# Patient Record
Sex: Female | Born: 1970
Health system: Southern US, Community
[De-identification: ages and names within clinical notes are randomized; demographics above are authoritative.]

## PROBLEM LIST (undated history)

## (undated) DIAGNOSIS — F329 Major depressive disorder, single episode, unspecified: Secondary | ICD-10-CM

## (undated) DIAGNOSIS — Z9989 Dependence on other enabling machines and devices: Secondary | ICD-10-CM

## (undated) DIAGNOSIS — F32A Depression, unspecified: Secondary | ICD-10-CM

## (undated) DIAGNOSIS — G4733 Obstructive sleep apnea (adult) (pediatric): Secondary | ICD-10-CM

## (undated) DIAGNOSIS — F419 Anxiety disorder, unspecified: Secondary | ICD-10-CM

## (undated) DIAGNOSIS — L4050 Arthropathic psoriasis, unspecified: Secondary | ICD-10-CM

## (undated) HISTORY — DX: Obstructive sleep apnea (adult) (pediatric): G47.33

## (undated) HISTORY — DX: Anxiety disorder, unspecified: F41.9

## (undated) HISTORY — DX: Arthropathic psoriasis, unspecified: L40.50

## (undated) HISTORY — DX: Major depressive disorder, single episode, unspecified: F32.9

## (undated) HISTORY — DX: Depression, unspecified: F32.A

## (undated) HISTORY — PX: FRACTURE SURGERY: SHX138

## (undated) HISTORY — DX: Dependence on other enabling machines and devices: Z99.89

---

## 2000-02-02 ENCOUNTER — Other Ambulatory Visit: Admission: RE | Admit: 2000-02-02 | Discharge: 2000-02-02 | Payer: Self-pay | Admitting: Gynecology

## 2000-04-12 ENCOUNTER — Encounter: Admission: RE | Admit: 2000-04-12 | Discharge: 2000-07-11 | Payer: Self-pay | Admitting: Gynecology

## 2000-09-02 ENCOUNTER — Observation Stay (HOSPITAL_COMMUNITY): Admission: AD | Admit: 2000-09-02 | Discharge: 2000-09-03 | Payer: Self-pay | Admitting: Gynecology

## 2000-09-25 ENCOUNTER — Inpatient Hospital Stay (HOSPITAL_COMMUNITY): Admission: AD | Admit: 2000-09-25 | Discharge: 2000-09-27 | Payer: Self-pay | Admitting: Gynecology

## 2000-11-06 ENCOUNTER — Other Ambulatory Visit: Admission: RE | Admit: 2000-11-06 | Discharge: 2000-11-06 | Payer: Self-pay | Admitting: Gynecology

## 2001-12-07 ENCOUNTER — Other Ambulatory Visit: Admission: RE | Admit: 2001-12-07 | Discharge: 2001-12-07 | Payer: Self-pay | Admitting: Gynecology

## 2002-12-27 ENCOUNTER — Other Ambulatory Visit: Admission: RE | Admit: 2002-12-27 | Discharge: 2002-12-27 | Payer: Self-pay | Admitting: Gynecology

## 2003-12-29 ENCOUNTER — Other Ambulatory Visit: Admission: RE | Admit: 2003-12-29 | Discharge: 2003-12-29 | Payer: Self-pay | Admitting: Gynecology

## 2004-08-03 ENCOUNTER — Encounter: Admission: RE | Admit: 2004-08-03 | Discharge: 2004-08-03 | Payer: Self-pay | Admitting: Orthopedic Surgery

## 2004-09-01 ENCOUNTER — Encounter: Admission: RE | Admit: 2004-09-01 | Discharge: 2004-09-01 | Payer: Self-pay | Admitting: Family Medicine

## 2004-09-11 ENCOUNTER — Encounter: Admission: RE | Admit: 2004-09-11 | Discharge: 2004-09-11 | Payer: Self-pay | Admitting: Neurology

## 2005-01-07 ENCOUNTER — Other Ambulatory Visit: Admission: RE | Admit: 2005-01-07 | Discharge: 2005-01-07 | Payer: Self-pay | Admitting: Gynecology

## 2005-11-14 ENCOUNTER — Ambulatory Visit (HOSPITAL_COMMUNITY): Admission: RE | Admit: 2005-11-14 | Discharge: 2005-11-14 | Payer: Self-pay | Admitting: Neurology

## 2005-11-18 ENCOUNTER — Ambulatory Visit (HOSPITAL_COMMUNITY): Admission: RE | Admit: 2005-11-18 | Discharge: 2005-11-18 | Payer: Self-pay | Admitting: Neurology

## 2006-01-13 ENCOUNTER — Other Ambulatory Visit: Admission: RE | Admit: 2006-01-13 | Discharge: 2006-01-13 | Payer: Self-pay | Admitting: Gynecology

## 2006-12-07 LAB — CONVERTED CEMR LAB: Pap Smear: NORMAL

## 2007-01-18 ENCOUNTER — Other Ambulatory Visit: Admission: RE | Admit: 2007-01-18 | Discharge: 2007-01-18 | Payer: Self-pay | Admitting: Gynecology

## 2007-02-13 ENCOUNTER — Inpatient Hospital Stay (HOSPITAL_COMMUNITY): Admission: AD | Admit: 2007-02-13 | Discharge: 2007-02-13 | Payer: Self-pay | Admitting: Gynecology

## 2007-09-24 ENCOUNTER — Inpatient Hospital Stay (HOSPITAL_COMMUNITY): Admission: RE | Admit: 2007-09-24 | Discharge: 2007-09-26 | Payer: Self-pay | Admitting: Obstetrics and Gynecology

## 2010-12-21 ENCOUNTER — Other Ambulatory Visit: Payer: Self-pay | Admitting: Family Medicine

## 2010-12-21 ENCOUNTER — Ambulatory Visit
Admission: RE | Admit: 2010-12-21 | Discharge: 2010-12-21 | Payer: Self-pay | Source: Home / Self Care | Attending: Family Medicine | Admitting: Family Medicine

## 2010-12-21 ENCOUNTER — Other Ambulatory Visit
Admission: RE | Admit: 2010-12-21 | Discharge: 2010-12-21 | Payer: Self-pay | Source: Home / Self Care | Admitting: Family Medicine

## 2010-12-21 DIAGNOSIS — N898 Other specified noninflammatory disorders of vagina: Secondary | ICD-10-CM | POA: Insufficient documentation

## 2010-12-21 DIAGNOSIS — F4323 Adjustment disorder with mixed anxiety and depressed mood: Secondary | ICD-10-CM | POA: Insufficient documentation

## 2010-12-21 DIAGNOSIS — F329 Major depressive disorder, single episode, unspecified: Secondary | ICD-10-CM | POA: Insufficient documentation

## 2010-12-21 DIAGNOSIS — F411 Generalized anxiety disorder: Secondary | ICD-10-CM | POA: Insufficient documentation

## 2010-12-21 LAB — HM MAMMOGRAPHY

## 2010-12-21 LAB — HM PAP SMEAR

## 2010-12-24 ENCOUNTER — Telehealth (INDEPENDENT_AMBULATORY_CARE_PROVIDER_SITE_OTHER): Payer: Self-pay | Admitting: *Deleted

## 2010-12-27 ENCOUNTER — Encounter: Payer: Self-pay | Admitting: Family Medicine

## 2010-12-29 ENCOUNTER — Encounter: Payer: Self-pay | Admitting: Family Medicine

## 2010-12-30 ENCOUNTER — Encounter: Payer: Self-pay | Admitting: Family Medicine

## 2011-01-03 ENCOUNTER — Encounter: Payer: Self-pay | Admitting: Family Medicine

## 2011-01-06 NOTE — Letter (Signed)
Summary: Results Follow up Letter  [place] at [facility]  [address] [place], [place] [address]   Phone: [phone]  Fax: [phone]    12/27/2010 MRN: [id number]  [facility] [address] [place], [place]  [address]  Dear Ms. Propes,  The following are the results of your recent test(s):  Test         Result    Pap Smear:        Normal __X___  Not Normal _____ Comments: ______________________________________________________ Cholesterol: LDL(Bad cholesterol):         Your goal is less than:         HDL (Good cholesterol):       Your goal is more than: Comments:  ______________________________________________________ Mammogram:        Normal _____  Not Normal _____ Comments:  ___________________________________________________________________ Hemoccult:        Normal _____  Not normal _______ Comments:    _____________________________________________________________________ Other Tests:    We routinely do not discuss normal results over the telephone.  If you desire a copy of the results, or you have any questions about this information we can discuss them at your next office visit.   Sincerely,      Dr. Ruthe Mannan

## 2011-01-06 NOTE — Assessment & Plan Note (Signed)
Summary: [name]   Vital Signs:  Patient profile:   40 year old female Height:      68 inches Weight:      227.25 pounds BMI:     34.68 Temp:     98.6 degrees F oral Pulse rate:   67 / minute Pulse rhythm:   regular BP sitting:   102 / 80  (right arm) Cuff size:   regular  Vitals Entered By: Linde Gillis CMA Duncan Dull) (December 21, 2010 10:59 AM) CC: new patient, establish care   History of Present Illness: 40 yo here to establish care and for CPX.  Adjustment disorder with mixed features- followed by Crystal Houston every two weeks.  Symptoms have worsened over past year.  Daughter battling osteosarcoma.  Last month, spot found on her lung.  LIving in constant anxiety. Lexapro 20 mg daily (just increased from 10 mg a few weeks ago) has been helping. No SI or HI. Has good supportive system at home.  Well woman- G3P3, no h/o abnormal pap smears. IUD placed 3 years ago. Having some thick, itchy discharge.   No dysuria or other vaginal complaints.    Preventive Screening-Counseling & Management      Drug Use:  no.    Current Medications (verified): 1)  Lexapro 20 Mg Tabs (Escitalopram Oxalate) .... Take One Tablet By Mouth At Bedtime 2)  Klonopin 0.5 Mg Tabs (Clonazepam) .... Take As Needed 3)  Multivitamins  Tabs (Multiple Vitamin) .... Take One Tablet By Mouth Daily 4)  Diflucan 150 Mg Tabs (Fluconazole) .... 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 Eyes:  Denies  blurring. ENT:  Denies difficulty swallowing. CV:  Denies chest pain or discomfort. Resp:  Denies shortness of breath. GI:  Denies abdominal pain. GU:  Complains of discharge; denies abnormal vaginal bleeding, dysuria, and nocturia. MS:  Denies joint pain, joint redness, and joint swelling. Derm:  Denies rash. Neuro:  Denies headaches. Psych:  Complains of anxiety, depression, easily tearful, and panic attacks; denies easily angered, irritability, mental problems, suicidal thoughts/plans, thoughts of violence, unusual visions or sounds, and thoughts /plans of harming others. Endo:  Denies cold intolerance and heat intolerance. Heme:  Denies abnormal bruising and bleeding.  Physical Exam  General:  alert and well-developed.   Head:  normocephalic and atraumatic.   Eyes:  vision grossly intact, pupils equal, and pupils round.   Ears:  R ear normal and L ear normal.   Nose:  no external deformity.   Mouth:  good dentition.   Neck:  No deformities, masses, or tenderness noted. Lungs:  Normal respiratory effort, chest expands symmetrically. Lungs are clear to auscultation, no crackles or wheezes. Heart:  Normal rate and regular rhythm. S1 and S2 normal without gallop, murmur, click, rub or other extra sounds. Abdomen:  Bowel sounds positive,abdomen soft and non-tender without masses, organomegaly or hernias noted. Genitalia:  Pelvic Exam:        External: normal female genitalia without lesions or masses        Vagina:  normal without lesions or masses        Cervix: normal without lesions or masses, IUD string visible- some thick, cheesy discharge in os        Adnexa: normal bimanual exam without masses or fullness        Uterus: normal by palpation        Pap smear: performed Extremities:  no edema old scar on left lower leg Neurologic:  alert & oriented X3 and gait normal.   Skin:  Intact without suspicious lesions or rashes Psych:  normally interactive and good eye contact.    tearful when talking about her daughter.    Impression & Recommendations:  Problem # 1:  HEALTH SCREENING (ICD-V70.0) Reviewed preventive care protocols, scheduled due services, and updated immunizations Discussed nutrition, exercise, diet, and healthy lifestyle.  BMET, FLP today. Pap performed today. Set up mammogram today. Orders: Venipuncture (40981) TLB-BMP (Basic Metabolic Panel-BMET) (80048-METABOL)  Problem # 2:  VAGINAL DISCHARGE (ICD-623.5) Assessment: New Consistent with yeast, diflucan 150 mg by mouth x 1.  Problem # 3:  ADJ DISORDER WITH MIXED ANXIETY & DEPRESSED MOOD (ICD-309.28) Assessment: Deteriorated Doing better now on Lexapro 20 mg daily, refilled her medication. Continue psychotherapy with Dr. Ardelle Anton.  Complete Medication List: 1)  Lexapro 20 Mg Tabs (Escitalopram oxalate) .... Take one tablet by mouth at bedtime 2)  Klonopin 0.5 Mg Tabs (Clonazepam) .... Take as needed 3)  Multivitamins Tabs (Multiple vitamin) .... Take one tablet by mouth daily 4)  Diflucan 150 Mg Tabs (Fluconazole) .... Once daily  Other Orders: Radiology Referral (Radiology) TLB-Lipid Panel (80061-LIPID)  Patient Instructions: 1)  Great to meet you. 2)  Please stop by to see Shirlee Limerick on your way out. Prescriptions: LEXAPRO 20 MG TABS (ESCITALOPRAM OXALATE) take one tablet by mouth at bedtime  #30 x 3   Entered and Authorized by:   Ruthe Mannan MD   Signed by:   Ruthe Mannan MD on 12/21/2010   Method used:   Electronically to        [id number] (retail)       [address]       [place], [place]  [address]       Ph: [phone]       Fax: [phone]   RxID:   [id number] DIFLUCAN 150 MG TABS (FLUCONAZOLE) once daily  #1 x 0   Entered and Authorized by:   Ruthe Mannan MD   Signed by:   Ruthe Mannan MD on 12/21/2010   Method used:   Electronically to        [facility] [address] [id number] ([facility]       [address]       [place], [place]  [address]       Ph: [phone]       Fax: [phone]   RxID:   [id number]    Orders Added: 1)  Radiology Referral [Radiology] 2)  Venipuncture [32202] 3)  TLB-Lipid Panel [80061-LIPID] 4)  TLB-BMP (Basic Metabolic Panel-BMET) [80048-METABOL] 5)  New Patient 40-64 years [99386] 6)  New Patient Level III [99203]    Current Allergies (reviewed today): No known allergies    Prevention & Chronic Care Immunizations   Influenza vaccine: Not documented    Tetanus booster: 01/03/2007: historical   Tetanus booster due: 01/03/2017    Pneumococcal vaccine: Not documented  Other Screening  Pap smear: historical- normal  (12/07/2006)   Pap smear action/deferral: Ordered  (12/21/2010)   Pap smear due: 12/08/2007    Mammogram: Not documented   Mammogram action/deferral: Ordered  (12/21/2010)   Smoking status: Not documented  Lipids   Total Cholesterol: Not documented   LDL: Not documented   LDL Direct: Not documented   HDL: Not documented   Triglycerides: Not documented   Nursing Instructions: Pap smear today Schedule screening mammogram (see order)   TD Result Date:  01/03/2007 TD Result:  historical PAP Result Date:  12/07/2006 PAP Result:  historical- normal   Appended Document: Orders Update    Clinical Lists Changes  Orders: Added new Service order of Specimen Handling (40981) - Signed Added new Test order of TLB-Lipid Panel (80061-LIPID) - Signed Added new Test order of TLB-BMP (Basic Metabolic Panel-BMET) (80048-METABOL) - Signed

## 2011-01-06 NOTE — Progress Notes (Signed)
----   Converted from flag ---- ---- 12/24/2010 10:37 AM, Mills Koller wrote: It was discovered today that the Labs from  two [facility] patient's were never done. Ofcourse the [facility] rep is looking into this...Marland Kitchen. but, this patient has been called and is coming in today for repeat draw. Terri ------------------------------

## 2011-01-12 NOTE — Letter (Signed)
Summary: Results Follow up Letter  [place] at [facility]  [address] [place], [place] [address]   Phone: [phone]  Fax: [phone]    01/03/2011 MRN: [id number]  [facility] [address] [place], [place]  [address]  Dear Ms. Trombly,  The following are the results of your recent test(s):  Test         Result    Pap Smear:        Normal _____  Not Normal _____ Comments: ______________________________________________________ Cholesterol: LDL(Bad cholesterol):         Your goal is less than:         HDL (Good cholesterol):       Your goal is more than: Comments:  ______________________________________________________ Mammogram:        Normal __X___  Not Normal _____ Comments: Repeat in one year.  ___________________________________________________________________ Hemoccult:        Normal _____  Not normal _______ Comments:    _____________________________________________________________________ Other Tests:    We routinely do not discuss normal results over the telephone.  If you desire a copy of the results, or you have any questions about this information we can discuss them at your next office visit.   Sincerely,      Dr. Ruthe Mannan

## 2011-01-17 ENCOUNTER — Ambulatory Visit (INDEPENDENT_AMBULATORY_CARE_PROVIDER_SITE_OTHER): Payer: 59 | Admitting: Family Medicine

## 2011-01-17 ENCOUNTER — Encounter: Payer: Self-pay | Admitting: Family Medicine

## 2011-01-17 DIAGNOSIS — J018 Other acute sinusitis: Secondary | ICD-10-CM | POA: Insufficient documentation

## 2011-01-26 NOTE — Assessment & Plan Note (Signed)
Summary: sinus infection/alc   Vital Signs:  Patient profile:   40 year old female Height:      68 inches Weight:      223.25 pounds BMI:     34.07 Temp:     97.5 degrees F oral Pulse rate:   82 / minute Pulse rhythm:   regular BP sitting:   122 / 84  (left arm) Cuff size:   regular  Vitals Entered By: Linde Gillis CMA Duncan Dull) (January 17, 2011 12:00 PM) CC: sinus infection   History of Present Illness: 40 yo here for ? sinus infection  4 days of worsening URI symptoms. started with runny nose.   now has frontal sinus pressure, chills. no fever that she is aware of. cough is productive. no wheezing or SOB. ears popping- taking OTC decongestant which is helping with ear pressure.  coughing spells keeping her up at night.  Current Medications (verified): 1)  Lexapro 20 Mg Tabs (Escitalopram Oxalate) .... Take One Tablet By Mouth At Bedtime 2)  Klonopin 0.5 Mg Tabs (Clonazepam) .... Take As Needed 3)  Multivitamins  Tabs (Multiple Vitamin) .... Take One Tablet By Mouth Daily 4)  Cheratussin Ac 100-10 Mg/60ml Syrp (Guaifenesin-Codeine) .... 5 Ml By Mouth Two Times A Day As Needed Cough 5)  Amoxicillin 875 Mg Tabs (Amoxicillin) .Marland Kitchen.. 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 ENT:  Complains of earache, nasal congestion, postnasal drainage, sinus pressure, and sore throat. Resp:  Complains of cough and sputum productive; denies shortness of breath and wheezing.  Physical Exam  General:  alert and well-developed.  congested. VSS Ears:  TMs retracted Nose:  boggy turbinates,  sinuses neg to palp Mouth:  pharyngeal erythema.   Lungs:  Normal respiratory effort, chest expands symmetrically. Lungs are clear to auscultation, no crackles or wheezes. Heart:  Normal rate and regular rhythm. S1 and S2 normal without gallop, murmur, click, rub or other extra sounds. Extremities:  no edema old scar on left lower leg Neurologic:  alert & oriented X3 and gait normal.   Psych:  normally interactive and good eye contact.      Impression & Recommendations:  Problem # 1:  OTHER ACUTE SINUSITIS (ICD-461.8) Assessment New likely viral at this point. cheratussin as needed cough. see pt instructions for supportive care. amoxicillin rx given to fill of no improvement over next several days. Her updated medication list for this problem includes:    Cheratussin Ac 100-10 Mg/26ml Syrp (Guaifenesin-codeine) .Marland KitchenMarland KitchenMarland KitchenMarland Kitchen 5 ml by mouth two times a day as needed cough    Amoxicillin 875 Mg Tabs (Amoxicillin) .Marland Kitchen... 1 tab by mouth two times a day x 10 days  Complete Medication List: 1)  Lexapro 20 Mg Tabs (Escitalopram oxalate) .... Take one tablet by mouth at bedtime 2)  Klonopin 0.5 Mg Tabs (Clonazepam) .... Take as needed 3)  Multivitamins Tabs (Multiple vitamin) .... Take one tablet by mouth daily 4)  Cheratussin Ac 100-10 Mg/50ml Syrp (Guaifenesin-codeine) .... 5 ml by mouth two times a day as needed cough 5)  Amoxicillin 875 Mg Tabs (Amoxicillin) .Marland Kitchen.. 1 tab by mouth two  times a day x 10 days  Patient Instructions: 1)  Take amoxicillin as directed if symptoms do not improve over next several days.  Drink lots of fluids.  Treat sympotmatically with Mucinex, nasal saline irrigation, and Tylenol/Ibuprofen.  Cough suppressant at night. Call if not improving as expected in 5-7 days after starting antibiotic.  Prescriptions: LEXAPRO 20 MG TABS (ESCITALOPRAM OXALATE) take one tablet by mouth at bedtime  #90 x 3   Entered and Authorized by:   Ruthe Mannan MD   Signed by:   Ruthe Mannan MD on  01/17/2011   Method used:   Print then Give to Patient   RxID:   [id number] AMOXICILLIN 875 MG TABS (AMOXICILLIN) 1 tab by mouth two times a day x 10 days  #20 x 0   Entered and Authorized by:   Ruthe Mannan MD   Signed by:   Ruthe Mannan MD on 01/17/2011   Method used:   Print then Give to Patient   RxID:   [id number] CHERATUSSIN AC 100-10 MG/5ML SYRP (GUAIFENESIN-CODEINE) 5 ml by mouth two times a day as needed cough  #5 ounces x 0   Entered and Authorized by:   Ruthe Mannan MD   Signed by:   Ruthe Mannan MD on 01/17/2011   Method used:   Print then Give to Patient   RxID:   [id number]    Orders Added: 1)  Est. Patient Level III [01027]    Current Allergies (reviewed today): No known allergies

## 2011-03-02 ENCOUNTER — Other Ambulatory Visit: Payer: Self-pay | Admitting: *Deleted

## 2011-03-02 MED ORDER — ESCITALOPRAM OXALATE 20 MG PO TABS: 20.0000 mg | ORAL_TABLET | Freq: Every day | ORAL | Status: DC

## 2011-03-02 NOTE — Telephone Encounter (Signed)
Rx sent to [facility] per patients request.

## 2011-04-22 NOTE — Discharge Summary (Signed)
[facility]  Patient:    Crystal [place], Crystal Houston                    MRN: [id number] Adm. Date:  [id number] Disc. Date: [id number] Attending:  Tonye Royalty Dictator:   Antony Contras, [facility]                           Discharge Summary  DISCHARGE DIAGNOSES:          Intrauterine pregnancy, 37-1/2 weeks, with                               spontaneous rupture of membranes.  PROCEDURES:                   Normal spontaneous vaginal delivery over midline                               episiotomy with delivery of viable infant.  HISTORY OF PRESENT ILLNESS:   The patient is a 40 year old, gravida 2, para 1-0-0-1, with an LMP of January 06, 2000, [facility] October 12, 2000.  Prenatal risk factors include a history of previous premature cervical effacement at 32-32 weeks with her last pregnancy, history of placenta previa at 13 weeks which did resolve on follow-up ultrasound.  PRENATAL LABORATORY DATA:     Blood type B positive, antibody screen negative, rubella equivocal.  RPR, HBsAg, HIV nonreactive.  MS-AFP normal.  GBS negative.  HOSPITAL COURSE:              The patient was admitted on September 25, 2000, with spontaneous rupture of membrane and uterine contraction.  Cervix was 3-4 cm dilated, 80% effaced, and -1 station.  There was some question as to the time of rupture of membranes, so penicillin G per protocol for GBS prophylaxis was initiated as well as Pitocin augmentation.  The patient progressed to complete dilatation delivering Apgar 9/9 female infant weighing 7 pounds 1 ounce over midline episiotomy.  Postpartum course:  She remained afebrile, no difficulty voiding.  Was able to be discharged on her second postpartum day in satisfactory condition.  She did receive rubella vaccine prior to discharge.  Postpartum CBC:  Hematocrit 29.8, hemoglobin 10.6, WBC 8.9, platelets 218.  DISCHARGE FOLLOWUP:            Follow up in six weeks.  DISCHARGE  MEDICATIONS:        Continue prenatal vitamins and iron.  Motrin and Tylox for pain. DD:  10/20/00 TD:  10/20/00 Job: [id number]

## 2011-04-22 NOTE — Procedures (Signed)
EEG NUMBER:  Is [id number]   This is an EEG report on this 40 year old woman ordered by Rene Kocher,  M.D., being evaluated for possible seizure with episodes of out-of-body  experience while driving. This is a routine 17 channel EEG with 1 channel  devoted to EKG utilizing [employer] 10/20 lead placement system. The  patient was described clinically as being awake. Electrographically she  appeared to be awake throughout the vast majority of the study although two  brief periods of drowsiness were seen. During these periods of drowsiness  the expected attenuation of background with decreased frequency in amplitude  was seen. While awake the background consisted of a well-organized, well  developed, well modulated 10 Hz alpha activity just predominant to a  posterior head regions and reactive to eye opening. No interhemispheric  asymmetry is identified. No definite epileptiform discharges were seen.  Photic stimulation produced some photic driving near the patient's native  flash frequencies. Hyperventilation did not produce any significant change  in the background activity. The EKG monitor reveals relatively regular  rhythm with a rate of 66 beats per minute.   CONCLUSION:  Normal EEG for the patient's conscious state without evidence  of seizure activity or focal abnormality seen during the course today's  recording. Clinical correlation is recommended.      Catherine A. Orlin Hilding, M.D.  Electronically Signed     [id number]  D:  11/14/2005 10:19:50  T:  11/14/2005 12:05:17  Job #:  [id number]   cc:   Rene Kocher, M.D.  Fax: [phone]

## 2011-04-22 NOTE — H&P (Signed)
[facility]  [place]:    Crystal [place], Crystal Houston                    MRN: [id number] Adm. Date:  [id number] Attending:  Tonye Royalty                         History and Physical  CHIEF COMPLAINT:              Contractions and watery-like discharge.  HISTORY:                      Crystal Houston is a 40 year old gravida 2, para 1 with a last menstrual period of January 06, 2000, and an estimated date of confinement of October 12, 2000, currently 37-1/2 weeks estimated gestational age.  Crystal Houston stated that, earlier today, she had a mucus-like, then a watery-like discharge and her contractions picked up later this evening.  In Crystal emergency room, she was evaluated and found to have a cervix to be 3-4 cm dilated, 80% effaced, -3 station, irregular contractions and no reassuring fetal heart rate tracing.  Clean amniotic fluid was grossly evident in Crystal vaginal vault.  PRENATAL COURSE:              She had early ultrasound which demonstrated placenta previa with subsequent ultrasound demonstrating complete resolution of Crystal placenta previa.  She otherwise had had unremarkable prenatal course. She had careful surveillance due to Crystal fact that, with her previous pregnancy, she had premature cervical effacement.  Nevertheless, she went on to deliver at 40 weeks in 1997.  PAST MEDICAL HISTORY:         Crystal Houston denies any allergies.  In 1997, normal spontaneous vaginal delivery of a 6 pound 14 ounce female.  REVIEW OF SYSTEMS:            See ______ form.  PHYSICAL EXAMINATION;  VITAL SIGNS:                  Temperature 97.4, pulse 84, respirations 2-0, blood pressure 136/86.  HEENT:                        Unremarkable.  NECK:                         Supple.  Trachea midline without bruits or thyromegaly.  LUNGS:                        Clear to auscultation without rhonchi or wheezes.  HEART:                        Regular rate and rhythm.  No  murmurs or gallops.  BREASTS:                      Not performed.  ABDOMEN:                      Gravid uterus.  Vertex presentation on Leopold maneuver.  Positive fetal heart sounds in Crystal 140 beats per minute range.  PELVIC:                       Cervix 3-4 cm dilated, 80% effaced, -3 station, gross pooling of clear amniotic fluid evident.  EXTREMITIES:                  DTRs 1 to 2+ with negative clonus.  PRENATAL LABORATORY DATA:     B positive blood type, negative antibody screen. VDRL nonreactive.  Rubella immune.  Hepatitis B surface antigen and HIV were negative.  Pap smear was normal.  Alpha fetoprotein was normal.  GBS cultures was negative.  Toxoplasmosis screen was also negative.  ASSESSMENT:                   A 40 year old gravida 2, para 1 at 37-1/2 weeks estimated gestational age with questionable rupture of membranes earlier in Crystal day evident at Crystal time of evaluation in Crystal emergency room at [facility], having irregular contractions.  Due to Crystal questionable length of time Crystal Houston may have been ruptured, will go ahead and initiate Penicillin G per protocol for GBS prophylaxis and will commence Pitocin augmentation. While Crystal Houston was monitored in Crystal emergency room, she had a reassuring fetal heart rate tracing.  PLAN:                         As per assessment above. DD:  09/25/00 TD:  09/25/00 Job: [id number]

## 2011-04-22 NOTE — Procedures (Signed)
EEG NUMBER:  [id number]   HISTORY:  This is a 40 year old having an  EEG to evaluate for seizures.   PROCEDURE:  This is a sleep-deprived EEG.   TECHNICAL DESCRIPTION:  Throughout this sleep-deprived EEG, the patient  begins extremely drowsy and quickly falls into stage II sleep with the  appearance of symmetric vertex waves and sleep spindles.  The background  activity is symmetric and mostly compromised of mixed frequency activity  between 30 and 40 microvolts.  Towards the end of the tracing, the patient  wakes and seems to have an appropriate posterior dominant rhythm up around 9  Hz at 20-30 microvolts.  With photic stimulation, the patient has a  symmetric photic driving response.  Hyperventilation produces a mild  symmetric slowing response.  Throughout this recording, there is no evidence  of electrographic seizures or interictal discharge activity.   IMPRESSION:  This routine EEG is within normal limits in the awake and sleep  states.      Bevelyn Buckles. Nash Shearer, M.D.  Electronically Signed     [id number]  D:  11/18/2005 17:44:52  T:  11/19/2005 09:58:34  Job #:  [id number]

## 2011-04-22 NOTE — Discharge Summary (Signed)
Crystal Houston, HEMING                ACCOUNT NO.:  [id number]   MEDICAL RECORD NO.:  [id number]          PATIENT TYPE:  INP   LOCATION:  9105                          FACILITY:  [facility]   PHYSICIAN:  Huel Cote, M.D. DATE OF BIRTH:  1971/09/13   DATE OF ADMISSION:  09/24/2007  DATE OF DISCHARGE:  09/26/2007                               DISCHARGE SUMMARY   DISCHARGE DIAGNOSES:  1. Term pregnancy at 39 plus weeks delivered.  2. Status post normal spontaneous vaginal delivery.   DISCHARGE MEDICATIONS:  1. Motrin 600 mg p.o. every 6 hours.  2. Percocet 1-2 tablets p.o. every 4 hours p.r.n.   DISCHARGE FOLLOWUP:  The patient is to follow up in the office in 6  weeks for routine postpartum exam.   HOSPITAL COURSE:  The patient is a 40 year old G3, P 2-0-0-2 was  admitted at 71 plus weeks gestation for induction of labor given  advanced cervical dilation to 4-5 cm.  She had no significant prenatal  problems.  Prenatal labs are as follows:  B+, antibody negative, RPR  nonreactive, rubella equivocal, hepatitis B surface antigen negative,  HIV negative, GC negative, chlamydia negative, group B strep negative, 1-  hour Glucola 98.   PAST OBSTETRICAL HISTORY:  She had a vaginal delivery 1997 and in 2001  she had another vaginal delivery.  Past GYN history none.   PAST SURGICAL HISTORY:  Just cosmetic surgeries on her left leg  secondary to a lawnmower accident as a child.   PAST MEDICAL HISTORY:  Fibromyalgia   ALLERGIES:  No allergies.   On admission she was afebrile with stable vital signs.  Fetal heart rate  was reactive.  Cervix was 80 and five and -1 station.  She had rupture  of membranes performed was placed on Pitocin and she did deliver within  3 hours, reached complete dilation and pushed well with a normal  spontaneous vaginal delivery of a vigorous female infant over a second-  degree laceration.  Apgars were 8 and 9, weight was 8 pounds 5 ounces.  Secondary laceration was  repaired with 2-0 Vicryl.  She did quite well  postpartum.  On postpartum day #2 was discharged home feeling well.  Her  postpartum hemoglobin was 11.9 and she was felt stable for discharge.      Huel Cote, M.D.  Electronically Signed    KR/MEDQ  D:  10/16/2007  T:  10/17/2007  Job:  [id number]

## 2011-04-30 ENCOUNTER — Encounter: Payer: Self-pay | Admitting: Family Medicine

## 2011-05-03 ENCOUNTER — Ambulatory Visit (INDEPENDENT_AMBULATORY_CARE_PROVIDER_SITE_OTHER): Payer: 59 | Admitting: Family Medicine

## 2011-05-03 ENCOUNTER — Encounter: Payer: Self-pay | Admitting: Family Medicine

## 2011-05-03 VITALS — BP 100/60 | HR 74 | Temp 98.4°F | Ht 68.0 in | Wt 230.8 lb

## 2011-05-03 DIAGNOSIS — E669 Obesity, unspecified: Secondary | ICD-10-CM | POA: Insufficient documentation

## 2011-05-03 DIAGNOSIS — F4323 Adjustment disorder with mixed anxiety and depressed mood: Secondary | ICD-10-CM

## 2011-05-03 MED ORDER — PHENTERMINE HCL 15 MG PO CAPS: ORAL_CAPSULE | ORAL | Status: DC

## 2011-05-03 NOTE — Patient Instructions (Signed)
Please follow up with me in 1 month. Keep a food journal and limit your calories to 1200-1500 calories per day.

## 2011-05-03 NOTE — Assessment & Plan Note (Signed)
Deteriorated. Will check TSH. >25 min spent with face to face with patient counseling and coordinating care on weight loss. Will try Phenteramine but I discussed risk of HTN, pulmonary HTN. The patient indicates understanding of these issues and agrees with the plan. Follow up in one month.

## 2011-05-03 NOTE — Progress Notes (Signed)
Addended by: Baldomero Lamy on: 05/03/2011 08:25 AM   Modules accepted: Orders

## 2011-05-03 NOTE — Assessment & Plan Note (Signed)
Unchanged. Actually doing quite well on Lexapro given increased stressors. Feels it is working well and does not want to stop it.  Will continue for now.

## 2011-05-03 NOTE — Progress Notes (Signed)
40 yo here to discuss medications.  Adjustment disorder with mixed features- followed by Erlene Senters every two weeks.  Symptoms have worsened over past year, husband filed for divorce.  Daughter battling osteosarcoma. Lexapro 20 mg dailyhas been helping. No SI or HI. Has good supportive system at home but feels like she just cannot stop gaining weight.  Obesity- Wt Readings from Last 3 Encounters:  05/03/11 230 lb 12.8 oz (104.69 kg)  01/17/11 223 lb 4 oz (101.266 kg)  12/21/10 227 lb 4 oz (103.08 kg)   Has been going to the gym twice a week and feels like she keeps gaining weight. Feels pressure to loose weight now more than ever because she does not want to be alone for the rest of her life. Has had some constipation and dry hair. No palpitations, CP or SOB. No other signs of hypo or hyperthyroidism.  The PMH, PSH, Social History, Family History, Medications, and allergies have been reviewed in [facility], and have been updated if relevant.   Review of Systems       See HPI General:  Denies malaise. Eyes:  Denies blurring. ENT:  Denies difficulty swallowing. CV:  Denies chest pain or discomfort. Resp:  Denies shortness of breath. MS:  Denies joint pain, joint redness, and joint swelling. Derm:  Denies rash. Neuro:  Denies headaches. Psych:  Complains of anxiety, depression, easily tearful, and panic attacks; denies easily angered, irritability, mental problems, suicidal thoughts/plans, thoughts of violence, unusual visions or sounds, and thoughts /plans of harming others. Endo:  Denies cold intolerance and heat intolerance. Heme:  Denies abnormal bruising and bleeding.  Physical Exam BP 100/60  Pulse 74  Temp(Src) 98.4 F (36.9 C) (Oral)  Ht 5\' 8"  (1.727 m)  Wt 230 lb 12.8 oz (104.69 kg)  BMI 35.09 kg/m2  SpO2 97% General:  alert and well-developed.   Head:  normocephalic and atraumatic.   Eyes:  vision grossly intact, pupils equal, and pupils round.   Ears:  R ear  normal and L ear normal.   Nose:  no external deformity.   Mouth:  good dentition.   Neck:  No deformities, masses, or tenderness noted. Lungs:  Normal respiratory effort, chest expands symmetrically. Lungs are clear to auscultation, no crackles or wheezes. Heart:  Normal rate and regular rhythm. S1 and S2 normal without gallop, murmur, click, rub or other extra sounds. Neurologic:  alert & oriented X3 and gait normal.   Skin:  Intact without suspicious lesions or rashes Psych:  normally interactive and good eye contact.   tearful when talking about her daughter.

## 2011-06-07 ENCOUNTER — Ambulatory Visit (INDEPENDENT_AMBULATORY_CARE_PROVIDER_SITE_OTHER): Payer: 59 | Admitting: Family Medicine

## 2011-06-07 ENCOUNTER — Ambulatory Visit: Payer: 59 | Admitting: Family Medicine

## 2011-06-07 ENCOUNTER — Encounter: Payer: Self-pay | Admitting: Family Medicine

## 2011-06-07 VITALS — BP 108/72 | HR 81 | Temp 98.0°F | Ht 68.0 in | Wt 230.8 lb

## 2011-06-07 DIAGNOSIS — E669 Obesity, unspecified: Secondary | ICD-10-CM

## 2011-06-07 MED ORDER — PHENTERMINE HCL 15 MG PO CAPS: ORAL_CAPSULE | ORAL | Status: DC

## 2011-06-07 NOTE — Progress Notes (Signed)
40 yo here for one month follow up of obesity.     Obesity- Wt Readings from Last 3 Encounters:  06/07/11 230 lb 12 oz (104.668 kg)  05/03/11 230 lb 12.8 oz (104.69 kg)  01/17/11 223 lb 4 oz (101.266 kg)    Has been going to the gym twice a week and feels like she keeps gaining weight. Feels pressure to loose weight now more than ever because she does not want to be alone for the rest of her life. TSH was normal last month. Started phenteramine 15 mg daily, noticed no difference in her appetite.  At first, she felt like she was less hungry, but not after first few days. Taking it after breakfast. Keep food journal but did not bring it with her. States she never eats more than 1700 calories.   No CP, palpitations or SOB.  The PMH, PSH, Social History, Family History, Medications, and allergies have been reviewed in [facility], and have been updated if relevant.   Review of Systems       See HPI General:  Denies malaise. Eyes:  Denies blurring. ENT:  Denies difficulty swallowing. CV:  Denies chest pain or discomfort. Resp:  Denies shortness of breath. MS:  Denies joint pain, joint redness, and joint swelling. Derm:  Denies rash. Neuro:  Denies headaches. Psych:   Denies easily angered, irritability, mental problems, suicidal thoughts/plans, thoughts of violence, unusual visions or sounds, and thoughts /plans of harming others. Endo:  Denies cold intolerance and heat intolerance. Heme:  Denies abnormal bruising and bleeding.  Physical Exam BP 108/72  Pulse 81  Temp(Src) 98 F (36.7 C) (Oral)  Ht 5\' 8"  (1.727 m)  Wt 230 lb 12 oz (104.668 kg)  BMI 35.09 kg/m2  General:  alert and well-developed.   Head:  normocephalic and atraumatic.   Eyes:  vision grossly intact, pupils equal, and pupils round.   Ears:  R ear normal and L ear normal.   Nose:  no external deformity.   Mouth:  good dentition.   Neck:  No deformities, masses, or tenderness noted. Lungs:  Normal respiratory  effort, chest expands symmetrically. Lungs are clear to auscultation, no crackles or wheezes. Heart:  Normal rate and regular rhythm. S1 and S2 normal without gallop, murmur, click, rub or other extra sounds. Neurologic:  alert & oriented X3 and gait normal.   Skin:  Intact without suspicious lesions or rashes Psych:  normally interactive and good eye contact.   tearful when talking about her daughter.   Assessment and Plan: 1. Obesity   Unchanged. Discussed going to see a nutritionist but she feels her insurance will not cover it. Will increase dose of phentermine to 15 mg twice daily. The patient indicates understanding of these issues and agrees with the plan.

## 2011-06-29 ENCOUNTER — Encounter: Payer: Self-pay | Admitting: Family Medicine

## 2011-06-29 ENCOUNTER — Ambulatory Visit (INDEPENDENT_AMBULATORY_CARE_PROVIDER_SITE_OTHER): Payer: 59 | Admitting: Family Medicine

## 2011-06-29 ENCOUNTER — Ambulatory Visit (INDEPENDENT_AMBULATORY_CARE_PROVIDER_SITE_OTHER)
Admission: RE | Admit: 2011-06-29 | Discharge: 2011-06-29 | Disposition: A | Discharge status: Home / Self Care | Payer: 59 | Source: Ambulatory Visit | Attending: Family Medicine | Admitting: Family Medicine

## 2011-06-29 VITALS — BP 110/80 | HR 81 | Temp 98.2°F | Wt 232.5 lb

## 2011-06-29 DIAGNOSIS — S39012A Strain of muscle, fascia and tendon of lower back, initial encounter: Secondary | ICD-10-CM

## 2011-06-29 DIAGNOSIS — S335XXA Sprain of ligaments of lumbar spine, initial encounter: Secondary | ICD-10-CM

## 2011-06-29 MED ORDER — CYCLOBENZAPRINE HCL 5 MG PO TABS: 5.0000 mg | ORAL_TABLET | Freq: Three times a day (TID) | ORAL | Status: DC | PRN

## 2011-06-29 NOTE — Progress Notes (Signed)
SUBJECTIVE:  Crystal Houston is a 40 y.o. female who complains of an injury causing low back pain 8 day(s) ago. The pain is positional with bending or lifting, with radiation down the legs. Mechanism of injury: lifting heavy boxes and pulling things out of cabinets. Symptoms have been abrupt since that time. Prior history of back problems: recurrent self limited episodes of low back pain in the past. There is no numbness in the legs.  Patient Active Problem List  Diagnoses  . ANXIETY  . ADJ DISORDER WITH MIXED ANXIETY & DEPRESSED MOOD  . DEPRESSION  . VAGINAL DISCHARGE  . OTHER ACUTE SINUSITIS  . Obesity   Past Medical History  Diagnosis Date  . Anxiety   . Depression    No past surgical history on file. History  Substance Use Topics  . Smoking status: Never Smoker   . Smokeless tobacco: Not on file  . Alcohol Use: No   Family History  Problem Relation Age of Onset  . Cancer Daughter     osteosarcoma of leg   No Known Allergies Current Outpatient Prescriptions on File Prior to Visit  Medication Sig Dispense Refill  . clonazePAM (KLONOPIN) 0.5 MG tablet Take as needed      . escitalopram (LEXAPRO) 20 MG tablet Take 1 tablet (20 mg total) by mouth daily.  90 tablet  3  . Multiple Vitamin (MULTIVITAMIN) tablet Take 1 tablet by mouth daily.        . phentermine 15 MG capsule 1 tab by mouth twice daily as directed.  60 capsule  0   The PMH, PSH, Social History, Family History, Medications, and allergies have been reviewed in [facility], and have been updated if relevant.  OBJECTIVE: BP 110/80  Pulse 81  Temp(Src) 98.2 F (36.8 C) (Oral)  Wt 232 lb 8 oz (105.461 kg)  Vital signs as noted above. Patient appears to be in mild to moderate pain, antalgic gait noted. Lumbosacral spine area reveals no local tenderness or mass. Painful and reduced LS ROM noted. Straight leg raise is positive at 90 degrees on right. DTR's, motor strength and sensation normal, including heel and toe gait.   Peripheral pulses are palpable. Lumbar spine X-Ray: ordered, but results not yet available.   ASSESSMENT:  lumbar strain  PLAN: Xray today given duration of symptoms. For acute pain, rest, intermittent application of cold packs (later, may switch to heat, but do not sleep on heating pad), analgesics and muscle relaxants are recommended. Discussed longer term treatment plan of prn NSAID's and discussed a home back care exercise program with flexion exercise routine. Proper lifting with avoidance of heavy lifting discussed. Consider Physical Therapy.Call or return to clinic prn if these symptoms worsen or fail to improve as anticipated.

## 2011-06-29 NOTE — Patient Instructions (Signed)
Back Pain & Injury Your back pain is most likely caused by a strain of the muscles or ligaments supporting the spine. Back strains cause pain and trouble moving because of muscle spasms. They may take several weeks to heal. Usually they are better in days.  Treatment for back pain includes:  Rest - Get bed rest as needed over the next day or two. Use a firm mattress and lie on your side with your knees slightly bent. If you lie on your back, put a pillow under your knees.   Early movement - Back pain improves most rapidly if you remain active. It is much more stressful on the back to sit or stand in one place. Do not sit, drive or stand in one place for more than 30 minutes at a time. Take short walks on level surfaces as soon as pain allows.   Limit bending and lifting - Do not bend over or lift anything over 20 pounds until instructed otherwise. Lift by bending your knees. Use your leg muscles to help. Keep the load close to your body and avoid twisting. Do not reach or do overhead work.   Medicines - Medicine to reduce pain and inflammation are helpful. Muscle-relaxing drugs may be prescribed.   Therapy - Put ice packs on your back every few hours for the first 2-3 days after your injury or as instructed. After that ice or heat may be alternated to reduce pain and spasm. Back exercises and gentle massage may be of some benefit. You should be examined again if your back pain is not better in one week.  SEEK IMMEDIATE MEDICAL CARE IF:  You have pain that radiates from your back into your legs.   You develop new bowel or bladder control problems.   You have unusual weakness or numbness in your arms or legs.   You develop nausea or vomiting.   You develop abdominal pain.   You feel faint.  Document Released: 11/21/2005 Document Re-Released: 08/30/2008 [employer] Patient Information 2011 [employer]. 

## 2011-07-06 ENCOUNTER — Telehealth: Payer: Self-pay | Admitting: *Deleted

## 2011-07-06 DIAGNOSIS — S39012A Strain of muscle, fascia and tendon of lower back, initial encounter: Secondary | ICD-10-CM

## 2011-07-06 NOTE — Telephone Encounter (Signed)
Pt is taking 5 mg's of flexeril every 8 hours for back pain, also taking aleve. This isnt helping and she was told if not better she would be given flexeril.  She really doesn't want to take this but if you think she should she will.  [facility]ses [facility].

## 2011-07-06 NOTE — Telephone Encounter (Signed)
Why don't we try physical therapy instead of flexeril then? I will place referral.

## 2011-07-06 NOTE — Telephone Encounter (Signed)
Correction to note below, should read that pt doesn't want to take prednisone.  Pt is agreeable to therapy, she would like to go to [facility] in [place].

## 2011-07-06 NOTE — Telephone Encounter (Signed)
Referral placed.

## 2011-08-10 ENCOUNTER — Other Ambulatory Visit: Payer: Self-pay | Admitting: *Deleted

## 2011-08-10 ENCOUNTER — Telehealth: Payer: Self-pay | Admitting: Family Medicine

## 2011-08-10 MED ORDER — CLONAZEPAM 0.5 MG PO TABS: 0.5000 mg | ORAL_TABLET | Freq: Three times a day (TID) | ORAL | Status: DC | PRN

## 2011-08-10 MED ORDER — ESCITALOPRAM OXALATE 20 MG PO TABS: 20.0000 mg | ORAL_TABLET | Freq: Every day | ORAL | Status: DC

## 2011-08-10 MED ORDER — BUSPIRONE HCL 30 MG PO TABS: ORAL_TABLET | ORAL | Status: DC

## 2011-08-10 NOTE — Telephone Encounter (Signed)
Pt is asking that refills be sent to [facility].  She has enough to last until [name] returns on Monday.  Her daughter's cancer has taken a turn for the worse.

## 2011-08-10 NOTE — Telephone Encounter (Signed)
I'm am sorry to hear that. I will call her to check on her.

## 2011-08-10 NOTE — Telephone Encounter (Signed)
Rx called to pharmacy, patient notified as instructed via telephone.

## 2011-08-10 NOTE — Telephone Encounter (Signed)
Spoke with pt. Daughter has metastatic cancer which is rapidly progressing. Anxiety is much worse, seeing Erlene Senters.  Just saw her yesterday. Wants to increase Lexapro dose to 40 mg. Instead, I would prefer to add Buspar.  Pt in agreement with plan. I advised her to call me on my cell phone anytime.

## 2011-08-15 ENCOUNTER — Telehealth: Payer: Self-pay | Admitting: *Deleted

## 2011-08-15 NOTE — Telephone Encounter (Signed)
On my desk

## 2011-08-15 NOTE — Telephone Encounter (Signed)
[facility] has faxed a form asking for clarification on lexapro script. Form is on your desk.

## 2011-08-18 NOTE — Telephone Encounter (Signed)
Form faxed by Carrie. 

## 2011-09-14 LAB — RPR: RPR Ser Ql: NONREACTIVE

## 2011-09-14 LAB — CBC
HCT: 33.8 — ABNORMAL LOW
HCT: 36.5
Hemoglobin: 11.9 — ABNORMAL LOW
Hemoglobin: 12.8
MCHC: 35.1
MCHC: 35.1
MCV: 85.4
MCV: 85.8
Platelets: 226
Platelets: 256
RBC: 3.93
RBC: 4.27
RDW: 13.3
RDW: 13.4
WBC: 6.1
WBC: 8.5

## 2011-10-26 ENCOUNTER — Encounter: Payer: Self-pay | Admitting: Family Medicine

## 2011-10-26 ENCOUNTER — Ambulatory Visit (INDEPENDENT_AMBULATORY_CARE_PROVIDER_SITE_OTHER): Payer: 59 | Admitting: Family Medicine

## 2011-10-26 VITALS — BP 110/62 | HR 72 | Temp 98.8°F | Ht 68.0 in | Wt 239.2 lb

## 2011-10-26 DIAGNOSIS — E669 Obesity, unspecified: Secondary | ICD-10-CM

## 2011-10-26 NOTE — Patient Instructions (Signed)
Good to see you. Have a Happy Thanksgiving. Please stop by to see marion on your way out.

## 2011-10-26 NOTE — Progress Notes (Signed)
40 yo here to discuss obestiy.  Obesity-  Wt Readings from Last 3 Encounters:  10/26/11 239 lb 4 oz (108.523 kg)  06/29/11 232 lb 8 oz (105.461 kg)  06/07/11 230 lb 12 oz (104.668 kg)    Has tried several diet- even went to the Bariatric clinic in [place] for awhile.   Was going to the gym, hired  Systems analyst. We tried phentermine earlier this summer. She feels like nothing is working.  Wants to consider other options.  Patient Active Problem List  Diagnoses  . ANXIETY  . ADJ DISORDER WITH MIXED ANXIETY & DEPRESSED MOOD  . DEPRESSION  . VAGINAL DISCHARGE  . OTHER ACUTE SINUSITIS  . Obesity   Past Medical History  Diagnosis Date  . Anxiety   . Depression    No past surgical history on file. History  Substance Use Topics  . Smoking status: Never Smoker   . Smokeless tobacco: Not on file  . Alcohol Use: No   Family History  Problem Relation Age of Onset  . Cancer Daughter     osteosarcoma of leg   No Known Allergies Current Outpatient Prescriptions on File Prior to Visit  Medication Sig Dispense Refill  . busPIRone (BUSPAR) 30 MG tablet 1 tablet by mouth twice daily (first two days, cut tablets in half to take 15 mg by mouth twice daily.)  30 tablet  3  . clonazePAM (KLONOPIN) 0.5 MG tablet Take 1 tablet (0.5 mg total) by mouth 3 (three) times daily as needed for anxiety. Take as needed  90 tablet  0  . cyclobenzaprine (FLEXERIL) 5 MG tablet Take 1 tablet (5 mg total) by mouth every 8 (eight) hours as needed for muscle spasms.  30 tablet  1  . escitalopram (LEXAPRO) 20 MG tablet Take 1 tablet (20 mg total) by mouth daily. Take 1 to 2 tablets daily as directed.  90 tablet  3     The PMH, PSH, Social History, Family History, Medications, and allergies have been reviewed in [facility], and have been updated if relevant.   Review of Systems       See HPI   Physical Exam BP 110/62  Pulse 72  Temp(Src) 98.8 F (37.1 C) (Oral)  Ht 5\' 8"  (1.727 m)  Wt 239 lb 4  oz (108.523 kg)  BMI 36.38 kg/m2  General:  alert and well-developed.   Head:  normocephalic and atraumatic.   Eyes:  vision grossly intact, pupils equal, and pupils round.   Ears:  R ear normal and L ear normal.   Nose:  no external deformity.   Mouth:  good dentition.   Neck:  No deformities, masses, or tenderness noted. Lungs:  Normal respiratory effort, chest expands symmetrically. Lungs are clear to auscultation, no crackles or wheezes. Heart:  Normal rate and regular rhythm. S1 and S2 normal without gallop, murmur, click, rub or other extra sounds. Neurologic:  alert & oriented X3 and gait normal.   Skin:  Intact without suspicious lesions or rashes Psych:  normally interactive and good eye contact.   tearful when talking about her daughter.   Assessment and Plan: 1. Obesity   Deteriorated. BMI 36.38. >25 min spent with face to face with patient, >50% counseling and/or coordinating care She would like to try gastric banding. Will refer to general surgery.

## 2011-11-02 ENCOUNTER — Telehealth: Payer: Self-pay | Admitting: *Deleted

## 2011-11-02 NOTE — Telephone Encounter (Signed)
Ok.  I will write something.

## 2011-11-02 NOTE — Telephone Encounter (Signed)
Spoke with patient and advised as instructed.

## 2011-11-02 NOTE — Telephone Encounter (Signed)
Patient called back to let [name] know that she spoke with the bariatric rep from [facility] and was advised that [name] should write a letter of medical necessity after patient has been to the bariatric seminar which she went to last night.  This letter is required first in order to get approval from patients insurance company and then patient will meet with the surgeon.  Please advise.

## 2011-11-02 NOTE — Telephone Encounter (Signed)
Typically the bariatric surgeons write these letters to her insurance company once they have met with her.

## 2011-11-02 NOTE — Telephone Encounter (Signed)
Pt wants to know if you ar able to do a letter of medical necessity for her weight loss surgery. She says that due to her bmi it requires comorbidities listed. She has seen Dr Jon Billings in the past for fibromyalgia, low back pain and osteoarthritis. She request you call if you need more info.

## 2011-11-10 ENCOUNTER — Inpatient Hospital Stay (HOSPITAL_COMMUNITY)
Admission: EM | Admit: 2011-11-10 | Discharge: 2011-11-12 | DRG: 494 | Disposition: A | Discharge status: Home / Self Care | Payer: 59 | Attending: Emergency Medicine | Admitting: Emergency Medicine

## 2011-11-10 ENCOUNTER — Emergency Department (HOSPITAL_COMMUNITY): Payer: 59

## 2011-11-10 ENCOUNTER — Encounter (HOSPITAL_COMMUNITY): Admission: EM | Disposition: A | Discharge status: Home / Self Care | Payer: Self-pay | Source: Home / Self Care

## 2011-11-10 ENCOUNTER — Encounter (HOSPITAL_COMMUNITY): Payer: Self-pay | Admitting: Certified Registered"

## 2011-11-10 ENCOUNTER — Encounter (HOSPITAL_COMMUNITY): Payer: Self-pay | Admitting: *Deleted

## 2011-11-10 DIAGNOSIS — F341 Dysthymic disorder: Secondary | ICD-10-CM | POA: Diagnosis present

## 2011-11-10 DIAGNOSIS — W1789XA Other fall from one level to another, initial encounter: Secondary | ICD-10-CM | POA: Diagnosis present

## 2011-11-10 DIAGNOSIS — S82843A Displaced bimalleolar fracture of unspecified lower leg, initial encounter for closed fracture: Secondary | ICD-10-CM

## 2011-11-10 DIAGNOSIS — W19XXXA Unspecified fall, initial encounter: Secondary | ICD-10-CM

## 2011-11-10 DIAGNOSIS — Y9339 Activity, other involving climbing, rappelling and jumping off: Secondary | ICD-10-CM

## 2011-11-10 DIAGNOSIS — S82843B Displaced bimalleolar fracture of unspecified lower leg, initial encounter for open fracture type I or II: Principal | ICD-10-CM | POA: Diagnosis present

## 2011-11-10 DIAGNOSIS — T148XXA Other injury of unspecified body region, initial encounter: Secondary | ICD-10-CM

## 2011-11-10 DIAGNOSIS — Y998 Other external cause status: Secondary | ICD-10-CM

## 2011-11-10 DIAGNOSIS — Y92009 Unspecified place in unspecified non-institutional (private) residence as the place of occurrence of the external cause: Secondary | ICD-10-CM

## 2011-11-10 HISTORY — PX: ORIF ANKLE FRACTURE: SHX5408

## 2011-11-10 HISTORY — PX: I & D EXTREMITY: SHX5045

## 2011-11-10 LAB — POCT I-STAT, CHEM 8
BUN: 11 mg/dL (ref 6–23)
Calcium, Ion: 1.14 mmol/L (ref 1.12–1.32)
Chloride: 104 mEq/L (ref 96–112)
Creatinine, Ser: 1 mg/dL (ref 0.50–1.10)
Glucose, Bld: 107 mg/dL — ABNORMAL HIGH (ref 70–99)
HCT: 38 % (ref 36.0–46.0)
Potassium: 3.9 mEq/L (ref 3.5–5.1)

## 2011-11-10 LAB — CBC
HCT: 38 % (ref 36.0–46.0)
Hemoglobin: 12.7 g/dL (ref 12.0–15.0)
MCH: 29.1 pg (ref 26.0–34.0)
MCHC: 33.4 g/dL (ref 30.0–36.0)
MCV: 87 fL (ref 78.0–100.0)
Platelets: 323 10*3/uL (ref 150–400)
RBC: 4.37 MIL/uL (ref 3.87–5.11)
RDW: 12.5 % (ref 11.5–15.5)
WBC: 8.7 10*3/uL (ref 4.0–10.5)

## 2011-11-10 SURGERY — IRRIGATION AND DEBRIDEMENT EXTREMITY
Anesthesia: General | Site: Ankle | Laterality: Right | Wound class: Contaminated

## 2011-11-10 MED ORDER — CEFAZOLIN SODIUM-DEXTROSE 2-3 GM-% IV SOLR
2.0000 g | Freq: Once | INTRAVENOUS | Status: AC
  Administered 2011-11-11: 2 g via INTRAVENOUS
  Filled 2011-11-10: qty 50

## 2011-11-10 MED ORDER — SODIUM CHLORIDE 0.9 % IV BOLUS (SEPSIS): 500.0000 mL | Freq: Once | INTRAVENOUS | Status: DC

## 2011-11-10 MED ORDER — LACTATED RINGERS IV SOLN
INTRAVENOUS | Status: DC | PRN
  Administered 2011-11-10 – 2011-11-11 (×2): via INTRAVENOUS

## 2011-11-10 SURGICAL SUPPLY — 64 items
BAG DECANTER FOR FLEXI CONT (MISCELLANEOUS) ×1 IMPLANT
BANDAGE ELASTIC 4 VELCRO ST LF (GAUZE/BANDAGES/DRESSINGS) ×2 IMPLANT
BANDAGE ELASTIC 6 VELCRO ST LF (GAUZE/BANDAGES/DRESSINGS) ×2 IMPLANT
BANDAGE ESMARK 6X9 LF (GAUZE/BANDAGES/DRESSINGS) ×1 IMPLANT
BIT DRILL 2.5X110 QC LCP DISP (BIT) ×1 IMPLANT
BNDG CMPR 9X6 STRL LF SNTH (GAUZE/BANDAGES/DRESSINGS) ×1
BNDG ESMARK 6X9 LF (GAUZE/BANDAGES/DRESSINGS) ×2
CLOTH BEACON ORANGE TIMEOUT ST (SAFETY) ×2 IMPLANT
COVER SURGICAL LIGHT HANDLE (MISCELLANEOUS) ×2 IMPLANT
CUFF TOURNIQUET SINGLE 18IN (TOURNIQUET CUFF) IMPLANT
CUFF TOURNIQUET SINGLE 24IN (TOURNIQUET CUFF) IMPLANT
CUFF TOURNIQUET SINGLE 34IN LL (TOURNIQUET CUFF) ×1 IMPLANT
CUFF TOURNIQUET SINGLE 44IN (TOURNIQUET CUFF) IMPLANT
DRAPE OEC MINIVIEW 54X84 (DRAPES) IMPLANT
DRAPE U-SHAPE 47X51 STRL (DRAPES) ×2 IMPLANT
DRSG ADAPTIC 3X8 NADH LF (GAUZE/BANDAGES/DRESSINGS) ×2 IMPLANT
DRSG PAD ABDOMINAL 8X10 ST (GAUZE/BANDAGES/DRESSINGS) ×3 IMPLANT
DURAPREP 26ML APPLICATOR (WOUND CARE) ×1 IMPLANT
ELECT REM PT RETURN 9FT ADLT (ELECTROSURGICAL) ×2
ELECTRODE REM PT RTRN 9FT ADLT (ELECTROSURGICAL) ×1 IMPLANT
FACESHIELD LNG OPTICON STERILE (SAFETY) ×2 IMPLANT
GLOVE BIO SURGEON STRL SZ8 (GLOVE) ×3 IMPLANT
GLOVE BIOGEL M 6.5 STRL (GLOVE) ×1 IMPLANT
GLOVE BIOGEL M 8.0 STRL (GLOVE) ×2 IMPLANT
GLOVE BIOGEL PI IND STRL 6.5 (GLOVE) IMPLANT
GLOVE BIOGEL PI IND STRL 7.5 (GLOVE) IMPLANT
GLOVE BIOGEL PI IND STRL 8 (GLOVE) ×1 IMPLANT
GLOVE BIOGEL PI INDICATOR 6.5 (GLOVE) ×3
GLOVE BIOGEL PI INDICATOR 7.5 (GLOVE) ×1
GLOVE BIOGEL PI INDICATOR 8 (GLOVE) ×1
GOWN EXTRA PROTECTION XL (GOWNS) ×1 IMPLANT
GOWN STRL NON-REIN LRG LVL3 (GOWN DISPOSABLE) ×6 IMPLANT
KIT 1/3 TUB PL 5H 61M (Orthopedic Implant) IMPLANT
KIT BASIN OR (CUSTOM PROCEDURE TRAY) ×2 IMPLANT
KIT ROOM TURNOVER OR (KITS) ×2 IMPLANT
MANIFOLD NEPTUNE II (INSTRUMENTS) ×2 IMPLANT
NS IRRIG 1000ML POUR BTL (IV SOLUTION) ×2 IMPLANT
PACK ORTHO EXTREMITY (CUSTOM PROCEDURE TRAY) ×2 IMPLANT
PAD ARMBOARD 7.5X6 YLW CONV (MISCELLANEOUS) ×4 IMPLANT
PAD CAST 4YDX4 CTTN HI CHSV (CAST SUPPLIES) ×1 IMPLANT
PADDING CAST ABS 6INX4YD NS (CAST SUPPLIES) ×1
PADDING CAST ABS COTTON 6X4 NS (CAST SUPPLIES) IMPLANT
PADDING CAST COTTON 4X4 STRL (CAST SUPPLIES) ×2
PROS 1/3 TUB PL 5H 61M (Orthopedic Implant) ×2 IMPLANT
SCREW CANC PT 4.0X50 (Screw) ×2 IMPLANT
SCREW CORTEX 3.5 14MM (Screw) ×2 IMPLANT
SCREW CORTEX 3.5 18MM (Screw) ×2 IMPLANT
SCREW LOCK CORT ST 3.5X14 (Screw) IMPLANT
SCREW LOCK CORT ST 3.5X18 (Screw) IMPLANT
SCRUB BETADINE 4OZ XXX (MISCELLANEOUS) ×1 IMPLANT
SOLUTION BETADINE 4OZ (MISCELLANEOUS) ×1 IMPLANT
SPONGE GAUZE 4X4 12PLY (GAUZE/BANDAGES/DRESSINGS) ×2 IMPLANT
SPONGE LAP 4X18 X RAY DECT (DISPOSABLE) ×3 IMPLANT
STAPLER VISISTAT 35W (STAPLE) ×2 IMPLANT
SUCTION FRAZIER TIP 10 FR DISP (SUCTIONS) ×2 IMPLANT
SUT ETHILON 3 0 FSL (SUTURE) ×2 IMPLANT
SUT ETHILON 4 0 PS 2 18 (SUTURE) ×2 IMPLANT
SUT VIC AB 2-0 CTB1 (SUTURE) ×4 IMPLANT
SUT VIC AB 3-0 CT1 27 (SUTURE)
SUT VIC AB 3-0 CT1 TAPERPNT 27 (SUTURE) ×1 IMPLANT
TOWEL OR 17X24 6PK STRL BLUE (TOWEL DISPOSABLE) ×2 IMPLANT
TOWEL OR 17X26 10 PK STRL BLUE (TOWEL DISPOSABLE) ×2 IMPLANT
TUBE CONNECTING 12X1/4 (SUCTIONS) ×2 IMPLANT
WATER STERILE IRR 1000ML POUR (IV SOLUTION) ×1 IMPLANT

## 2011-11-10 NOTE — H&P (Signed)
Crystal Houston is an 40 y.o. female.   Chief Complaint: Right ankle pain HPI: Patient is a 40 year old female who sustained a bad ankle fracture earlier today when she jumped off her porch.  She had immediate onset of pain and also a laceration on the inner sider of her ankle from the break.  She was brought to [facility] ED where she was found to have an ankle fracture dislocation with an open wound.  She was evaluated by Dr. Lequita Halt and admitted for surgical intervention.  Past Medical History  Diagnosis Date  . Anxiety   . Depression   . Anxiety     History reviewed. No pertinent past surgical history.  Family History  Problem Relation Age of Onset  . Cancer Daughter     osteosarcoma of leg   Social History:  reports that she has never smoked. She does not have any smokeless tobacco history on file. She reports that she does not drink alcohol or use illicit drugs.  Allergies: No Known Allergies  Medications Prior to Admission  Medication Dose Route Frequency Provider Last Rate Last Dose  . ceFAZolin (ANCEF) IVPB 2 g/50 mL premix  2 g Intravenous Once Homero Fellers V Ludean Duhart      . sodium chloride 0.9 % bolus 500 mL  500 mL Intravenous Once [employer], [employer]       Medications Prior to Admission  Medication Sig Dispense Refill  . busPIRone (BUSPAR) 30 MG tablet 1 tablet by mouth twice daily (first two days, cut tablets in half to take 15 mg by mouth twice daily.)  30 tablet  3  . clonazePAM (KLONOPIN) 0.5 MG tablet Take 0.5 mg by mouth 3 (three) times daily as needed. Take as needed for anxiety       . cyclobenzaprine (FLEXERIL) 5 MG tablet Take 5 mg by mouth every 8 (eight) hours as needed. For muscle spasms      . escitalopram (LEXAPRO) 20 MG tablet Take 20 mg by mouth daily. Take 1 tablet every night.         Results for orders placed during the hospital encounter of 11/10/11 (from the past 48 hour(s))  CBC     Status: Normal   Collection Time   11/10/11  8:39 PM      Component Value Range  Comment   WBC 8.7  4.0 - 10.5 (K/uL)    RBC 4.37  3.87 - 5.11 (MIL/uL)    Hemoglobin 12.7  12.0 - 15.0 (g/dL)    HCT 16.1  09.6 - 04.5 (%)    MCV 87.0  78.0 - 100.0 (fL)    MCH 29.1  26.0 - 34.0 (pg)    MCHC 33.4  30.0 - 36.0 (g/dL)    RDW 40.9  81.1 - 91.4 (%)    Platelets 323  150 - 400 (K/uL)   POCT I-STAT, CHEM 8     Status: Abnormal   Collection Time   11/10/11  8:55 PM      Component Value Range Comment   Sodium 141  135 - 145 (mEq/L)    Potassium 3.9  3.5 - 5.1 (mEq/L)    Chloride 104  96 - 112 (mEq/L)    BUN 11  6 - 23 (mg/dL)    Creatinine, Ser 7.82  0.50 - 1.10 (mg/dL)    Glucose, Bld 956 (*) 70 - 99 (mg/dL)    Calcium, Ion 2.13  1.12 - 1.32 (mmol/L)    TCO2 26  0 -  100 (mmol/L)    Hemoglobin 12.9  12.0 - 15.0 (g/dL)    HCT 04.5  40.9 - 81.1 (%)    Dg Chest Port 1 View  11/10/2011  *RADIOLOGY REPORT*  Clinical Data: Preoperative chest radiograph for right ankle fracture.  PORTABLE CHEST - 1 VIEW  Comparison: None.  Findings: The lungs are well-aerated.  There is elevation of the right hemidiaphragm; vascular crowding and likely mild vascular congestion are noted.  There is no evidence of focal opacification, pleural effusion or pneumothorax.  The cardiomediastinal silhouette is mildly enlarged.  No acute osseous abnormalities are seen.  IMPRESSION: Mild vascular congestion and mild cardiomegaly noted; lungs remain grossly clear.  Original Report Authenticated By: Tonia Ghent, M.D.   Dg Ankle Right Port  11/10/2011  *RADIOLOGY REPORT*  Clinical Data: Open ankle fracture.  Fall, deformity.  Pain.  PORTABLE RIGHT ANKLE - 2 VIEW  Comparison: None.  Findings: Fracture dislocation at the right ankle.  There is a transverse fracture through the distal right fibular shaft. Fracture through the medial and presumed posterior malleoli of the distal tibia.  The tibia is dislocated medial relative to the talus.  Fracture fragments are markedly displaced.  IMPRESSION: Fracture dislocation  of the right ankle as above.  Distal fibular shaft fracture, angulated and displaced.  Original Report Authenticated By: Cyndie Chime, M.D.    Review of Systems  Constitutional: Negative for fever and chills.  Eyes: Negative for blurred vision and double vision.  Respiratory: Negative for cough, hemoptysis and wheezing.   Cardiovascular: Negative for chest pain, palpitations and orthopnea.  Gastrointestinal: Negative for heartburn, nausea, vomiting, diarrhea, constipation and blood in stool.  Genitourinary: Negative for dysuria, urgency, frequency and hematuria.  Musculoskeletal: Positive for joint pain.       Ankle pain  Neurological: Negative for dizziness, seizures and headaches.    Blood pressure 129/74, pulse 112, temperature 97.9 F (36.6 C), temperature source Oral, resp. rate 17, SpO2 96.00%. Physical Exam  Constitutional: She is oriented to person, place, and time. She appears well-developed and well-nourished. She appears distressed.  HENT:  Head: Normocephalic and atraumatic.  Eyes: EOM are normal.  Neck: Normal range of motion. Neck supple.  Cardiovascular: Normal rate, regular rhythm and normal heart sounds.   No murmur heard. Respiratory: Effort normal and breath sounds normal. She has no wheezes. She has no rales.  GI: Soft. Bowel sounds are normal. There is no tenderness. There is no guarding.  Genitourinary:       Not done. Deferred.  Musculoskeletal:       Right ankle: She exhibits deformity and laceration. tenderness. Lateral malleolus and medial malleolus tenderness found.       Feet:  Neurological: She is alert and oriented to person, place, and time.     Assessment/Plan Open Fracture/Dislocation Right Ankle  Patient to be admitted for ORIF/[place] of the right ankle fracture.  Surgery to be performed by Dr. Lequita Halt.  PERKINS, ALEXZANDREW 11/10/2011, 11:37 PM  Pt seen and examined by me. She has an open bi/?tri malleolar ankle fracture sustained  earlier this evening when jumping off her porch playing with her children. She has intact neurovascular function. Xray shows significant comminution on the medial fragment. Plan to go to OR immediately for I&D with ORIF of the ankle fx/dislocation. We discussed that if the medial fragment is too comminuted or if the plafond is involved then we wil have our foot and ankle specialist treat that in delayed fashion but that in this  setting I would at least do the I&D and fibular fixation and hopefully the medial side also. Procedure, risks, potential comps and rehab course are discussed in detail and patient elects to proceed with surgery.  Gus Rankin Kalei Meda, MD    11/10/2011, 11:50 PM

## 2011-11-10 NOTE — ED Notes (Signed)
transported to OR with Magda Paganini, Charity fundraiser.

## 2011-11-10 NOTE — ED Notes (Signed)
Patient is resting comfortably. 

## 2011-11-10 NOTE — ED Provider Notes (Signed)
Pt fell and hurt her left ankle.  pe deformed left ankle with laceration ankle medially.  dp pulse 2 plus  Benny Lennert, MD 11/10/11 2039

## 2011-11-10 NOTE — ED Notes (Signed)
Patient transported to X-ray- Portable done at bedside . MDF in room to asses X-RAy.

## 2011-11-10 NOTE — Anesthesia Preprocedure Evaluation (Addendum)
Anesthesia Evaluation  Patient identified by MRN, date of birth, ID band Patient awake    Reviewed: Allergy & Precautions, H&P , NPO status , Patient's Chart, lab work & pertinent test results  History of Anesthesia Complications Negative for: history of anesthetic complications  Airway Mallampati: I TM Distance: >3 FB Neck ROM: full    Dental   Pulmonary neg pulmonary ROS,          Cardiovascular Exercise Tolerance: Good regular Normal    Neuro/Psych    GI/Hepatic negative GI ROS, Neg liver ROS,   Endo/Other  Negative Endocrine ROS  Renal/GU negative Renal ROS  Genitourinary negative   Musculoskeletal negative musculoskeletal ROS (+)   Abdominal   Peds  Hematology negative hematology ROS (+)   Anesthesia Other Findings   Reproductive/Obstetrics                          Anesthesia Physical Anesthesia Plan  ASA: II and Emergent  Anesthesia Plan: General   Post-op Pain Management:    Induction: Intravenous, Rapid sequence and Cricoid pressure planned  Airway Management Planned: Oral ETT  Additional Equipment:   Intra-op Plan:   Post-operative Plan: Extubation in OR  Informed Consent: I have reviewed the patients History and Physical, chart, labs and discussed the procedure including the risks, benefits and alternatives for the proposed anesthesia with the patient or authorized representative who has indicated his/her understanding and acceptance.   Dental advisory given  Plan Discussed with: CRNA, Anesthesiologist and Surgeon  Anesthesia Plan Comments:         Anesthesia Quick Evaluation

## 2011-11-10 NOTE — ED Notes (Signed)
Family at bedside. 

## 2011-11-10 NOTE — ED Notes (Signed)
Pt jumped off porch and landed on her left leg/foot approx 3-4 feet high. Pt did not land on head and denies LOC. Pt has deformity to the left lower leg painful for movement and touch. Pt had 250 by EMS and of fentyal en route and in room. Pt has 20g right hand. Pt BP 90/50 and 500 bolus given. Breath sounds clear and equal.

## 2011-11-11 ENCOUNTER — Inpatient Hospital Stay (HOSPITAL_COMMUNITY): Payer: 59

## 2011-11-11 ENCOUNTER — Encounter (HOSPITAL_COMMUNITY): Payer: Self-pay | Admitting: Certified Registered"

## 2011-11-11 ENCOUNTER — Encounter (HOSPITAL_COMMUNITY): Payer: Self-pay | Admitting: *Deleted

## 2011-11-11 ENCOUNTER — Emergency Department (HOSPITAL_COMMUNITY): Payer: 59 | Admitting: Certified Registered"

## 2011-11-11 MED ORDER — ROCURONIUM BROMIDE 100 MG/10ML IV SOLN
INTRAVENOUS | Status: DC | PRN
  Administered 2011-11-11: 40 mg via INTRAVENOUS

## 2011-11-11 MED ORDER — DOCUSATE SODIUM 100 MG PO CAPS
100.0000 mg | ORAL_CAPSULE | Freq: Two times a day (BID) | ORAL | Status: DC
  Administered 2011-11-11 (×2): 100 mg via ORAL
  Filled 2011-11-11 (×4): qty 1

## 2011-11-11 MED ORDER — ONDANSETRON HCL 4 MG/2ML IJ SOLN
INTRAMUSCULAR | Status: DC | PRN
  Administered 2011-11-11: 4 mg via INTRAVENOUS

## 2011-11-11 MED ORDER — METHOCARBAMOL 100 MG/ML IJ SOLN
500.0000 mg | Freq: Four times a day (QID) | INTRAVENOUS | Status: DC | PRN
  Administered 2011-11-11: 500 mg via INTRAVENOUS
  Filled 2011-11-11: qty 5

## 2011-11-11 MED ORDER — METHOCARBAMOL 500 MG PO TABS
500.0000 mg | ORAL_TABLET | Freq: Four times a day (QID) | ORAL | Status: DC | PRN
  Administered 2011-11-11 (×2): 500 mg via ORAL
  Filled 2011-11-11 (×2): qty 1

## 2011-11-11 MED ORDER — CEFAZOLIN SODIUM 1-5 GM-% IV SOLN
1.0000 g | Freq: Four times a day (QID) | INTRAVENOUS | Status: DC
  Administered 2011-11-11 – 2011-11-12 (×5): 1 g via INTRAVENOUS
  Filled 2011-11-11 (×9): qty 50

## 2011-11-11 MED ORDER — METHOCARBAMOL 100 MG/ML IJ SOLN
500.0000 mg | INTRAVENOUS | Status: AC
  Filled 2011-11-11: qty 5

## 2011-11-11 MED ORDER — ESCITALOPRAM OXALATE 20 MG PO TABS: 20.0000 mg | ORAL_TABLET | Freq: Every day | ORAL | Status: DC

## 2011-11-11 MED ORDER — ENOXAPARIN SODIUM 40 MG/0.4ML ~~LOC~~ SOLN
40.0000 mg | SUBCUTANEOUS | Status: DC
  Administered 2011-11-11: 40 mg via SUBCUTANEOUS
  Filled 2011-11-11 (×2): qty 0.4

## 2011-11-11 MED ORDER — POLYETHYLENE GLYCOL 3350 17 G PO PACK
17.0000 g | PACK | Freq: Every day | ORAL | Status: DC | PRN
  Filled 2011-11-11: qty 1

## 2011-11-11 MED ORDER — SODIUM CHLORIDE 0.9 % IV SOLN
INTRAVENOUS | Status: DC
  Administered 2011-11-11: 05:00:00 via INTRAVENOUS

## 2011-11-11 MED ORDER — ONDANSETRON HCL 4 MG/2ML IJ SOLN: 4.0000 mg | Freq: Once | INTRAMUSCULAR | Status: DC | PRN

## 2011-11-11 MED ORDER — HYDROMORPHONE HCL PF 1 MG/ML IJ SOLN
INTRAMUSCULAR | Status: AC
  Filled 2011-11-11: qty 1

## 2011-11-11 MED ORDER — MORPHINE SULFATE 2 MG/ML IJ SOLN
1.0000 mg | INTRAMUSCULAR | Status: DC | PRN
  Administered 2011-11-11 (×3): 2 mg via INTRAVENOUS
  Filled 2011-11-11 (×5): qty 1

## 2011-11-11 MED ORDER — TEMAZEPAM 15 MG PO CAPS: 15.0000 mg | ORAL_CAPSULE | Freq: Every evening | ORAL | Status: DC | PRN

## 2011-11-11 MED ORDER — MIDAZOLAM HCL 5 MG/5ML IJ SOLN
INTRAMUSCULAR | Status: DC | PRN
  Administered 2011-11-11: 2 mg via INTRAVENOUS

## 2011-11-11 MED ORDER — CLONAZEPAM 0.5 MG PO TABS
0.5000 mg | ORAL_TABLET | Freq: Three times a day (TID) | ORAL | Status: DC | PRN
  Filled 2011-11-11: qty 1

## 2011-11-11 MED ORDER — PROPOFOL 10 MG/ML IV EMUL
INTRAVENOUS | Status: DC | PRN
  Administered 2011-11-11: 170 mg via INTRAVENOUS
  Administered 2011-11-11: 30 mg via INTRAVENOUS

## 2011-11-11 MED ORDER — ONDANSETRON HCL 4 MG/2ML IJ SOLN
4.0000 mg | Freq: Four times a day (QID) | INTRAMUSCULAR | Status: DC | PRN
  Administered 2011-11-11: 4 mg via INTRAVENOUS
  Filled 2011-11-11: qty 2

## 2011-11-11 MED ORDER — ESCITALOPRAM OXALATE 20 MG PO TABS
20.0000 mg | ORAL_TABLET | Freq: Every day | ORAL | Status: DC
  Administered 2011-11-11: 20 mg via ORAL
  Filled 2011-11-11 (×2): qty 1

## 2011-11-11 MED ORDER — BUSPIRONE HCL 10 MG PO TABS
10.0000 mg | ORAL_TABLET | Freq: Two times a day (BID) | ORAL | Status: DC
  Administered 2011-11-11 (×2): 10 mg via ORAL
  Filled 2011-11-11 (×4): qty 1

## 2011-11-11 MED ORDER — DEXAMETHASONE SODIUM PHOSPHATE 4 MG/ML IJ SOLN
INTRAMUSCULAR | Status: DC | PRN
  Administered 2011-11-11: 4 mg via INTRAVENOUS

## 2011-11-11 MED ORDER — HYDROMORPHONE HCL PF 1 MG/ML IJ SOLN
0.2500 mg | INTRAMUSCULAR | Status: DC | PRN
  Administered 2011-11-11 (×5): 0.5 mg via INTRAVENOUS

## 2011-11-11 MED ORDER — DIPHENHYDRAMINE HCL 12.5 MG/5ML PO ELIX
12.5000 mg | ORAL_SOLUTION | ORAL | Status: DC | PRN
  Filled 2011-11-11: qty 10

## 2011-11-11 MED ORDER — METOCLOPRAMIDE HCL 5 MG/ML IJ SOLN
5.0000 mg | Freq: Three times a day (TID) | INTRAMUSCULAR | Status: DC | PRN
  Filled 2011-11-11: qty 2

## 2011-11-11 MED ORDER — LACTATED RINGERS IV SOLN: INTRAVENOUS | Status: DC

## 2011-11-11 MED ORDER — OXYCODONE HCL 5 MG PO TABS
5.0000 mg | ORAL_TABLET | ORAL | Status: DC | PRN
  Administered 2011-11-11 – 2011-11-12 (×6): 10 mg via ORAL
  Filled 2011-11-11 (×6): qty 2
  Filled 2011-11-11: qty 1

## 2011-11-11 MED ORDER — CLONAZEPAM 0.5 MG PO TABS: 0.5000 mg | ORAL_TABLET | Freq: Three times a day (TID) | ORAL | Status: DC | PRN

## 2011-11-11 MED ORDER — ONDANSETRON HCL 4 MG PO TABS
4.0000 mg | ORAL_TABLET | Freq: Four times a day (QID) | ORAL | Status: DC | PRN
  Administered 2011-11-12: 4 mg via ORAL
  Filled 2011-11-11: qty 1

## 2011-11-11 MED ORDER — METOCLOPRAMIDE HCL 10 MG PO TABS: 5.0000 mg | ORAL_TABLET | Freq: Three times a day (TID) | ORAL | Status: DC | PRN

## 2011-11-11 MED ORDER — SUFENTANIL CITRATE 50 MCG/ML IV SOLN
INTRAVENOUS | Status: DC | PRN
  Administered 2011-11-11: 20 ug via INTRAVENOUS
  Administered 2011-11-11: 30 ug via INTRAVENOUS

## 2011-11-11 NOTE — Progress Notes (Signed)
Occupational Therapy Evaluation Patient Details Name: Crystal Houston MRN: [id number] DOB: January 12, 1971 Today's Date: 11/11/2011  Problem List:  Patient Active Problem List  Diagnoses  . ANXIETY  . ADJ DISORDER WITH MIXED ANXIETY & DEPRESSED MOOD  . DEPRESSION  . VAGINAL DISCHARGE  . OTHER ACUTE SINUSITIS  . Obesity    Past Medical History:  Past Medical History  Diagnosis Date  . Anxiety   . Depression   . Anxiety    Past Surgical History:  Past Surgical History  Procedure Date  . Fracture surgery     OT Assessment/Plan/Recommendation OT Assessment Clinical Impression Statement: 40 yo female s/p ORIF rt Ankle with decr bed mobility that could benefit from skilled OT to maximize independence with d/c home OT Recommendation/Assessment: Patient will need skilled OT in the acute care venue OT Problem List: Decreased strength;Impaired balance (sitting and/or standing);Decreased knowledge of use of DME or AE OT Therapy Diagnosis : Acute pain OT Plan OT Frequency: Min 2X/week OT Treatment/Interventions: Self-care/ADL training;DME and/or AE instruction;Balance training;Patient/family education OT Recommendation Follow Up Recommendations: None Equipment Recommended: Wheelchair (measurements);Wheelchair cushion (measurements) OT Goals Acute Rehab OT Goals OT Goal Formulation: With patient Time For Goal Achievement: 7 days Miscellaneous OT Goals Miscellaneous OT Goal #1: pt will perform bed mobility HOB <20 with no bed rail S level as a precursor to d/c home Miscellaneous OT Goal #2: Pt will complete basic transfer to sink level MOD I as precursor to home ADLs Miscellaneous OT Goal #3: Pt will complete 3n1 transfer over commode MOD I with no safety cues maintaining TDWB on Rt LE.  OT Evaluation Precautions/Restrictions  Precautions Precautions: Fall Required Braces or Orthoses: Yes Other Brace/Splint: boot R ankle Restrictions Weight Bearing Restrictions: Yes RLE  Weight Bearing: Touchdown weight bearing Prior Functioning Home Living Lives With: Family Receives Help From: Family;Friend(s) Type of Home: House Home Layout: Able to live on main level with bedroom/bathroom;Two level Home Access: Stairs to enter Entrance Stairs-Rails: Can reach both Entrance Stairs-Number of Steps: 3 Bathroom Shower/Tub: Walk-in shower;Door (corner built in seat) Firefighter: Standard (seperate room so no counters to push up from) Home Adaptive Equipment: Walker - rolling;Bedside commode/3-in-1 Additional Comments: Pt's parents are moving into the home to A with kids and help pt with ADLS Prior Function Level of Independence: Independent with basic ADLs;Independent with homemaking with ambulation;Independent with gait Able to Take Stairs?: Reciprically Driving: Yes Comments: Has 41 yo with [place], 41 yo dgter and 81 yo son ADL ADL Eating/Feeding: Performed;Modified independent Where Assessed - Eating/Feeding: Edge of bed Toilet Transfer: Performed;Modified independent Toilet Transfer Method: Stand pivot Acupuncturist: Bedside commode Toileting - Clothing Manipulation: Simulated;Modified independent Where Assessed - Toileting Clothing Manipulation: Sit to stand from 3-in-1 or toilet Toileting - Hygiene: Simulated;Modified independent Where Assessed - Toileting Hygiene: Sit to stand from 3-in-1 or toilet Equipment Used: Rolling walker ADL Comments: pt finishing bath with tech on arrival. Pt able to complete UB with setup, min A with LB due to foley in place. Pt Aed with don of underwear due to boot on R LE. Pt educated on dressing RLE first. Pt familiar with DME due to dgter CA hx of therapy. Vision/Perception  Vision - History Baseline Vision: Bifocals Patient Visual Report: No change from baseline Cognition Cognition Arousal/Alertness: Awake/alert Overall Cognitive Status: Appears within functional limits for tasks assessed Orientation Level:  Oriented X4 Sensation/Coordination Sensation Light Touch: Appears Intact Proprioception: Appears Intact Coordination Gross Motor Movements are Fluid and Coordinated: Yes Fine Motor Movements  are Fluid and Coordinated: Yes Extremity Assessment RUE Assessment RUE Assessment: Within Functional Limits LUE Assessment LUE Assessment: Within Functional Limits Mobility  Bed Mobility Bed Mobility: Yes Supine to Sit: 4: Min assist (MIN GUARD A) Supine to Sit Details (indicate cue type and reason): Pt using Rt bed rail to scoot to EOB. A holding RLE with scooting Transfers Transfers: Yes Sit to Stand: 6: Modified independent (Device/Increase time);With upper extremity assist;From bed Stand to Sit: 6: Modified independent (Device/Increase time);To chair/3-in-1;With upper extremity assist Exercises   End of Session OT - End of Session Activity Tolerance: Patient tolerated treatment well Patient left: in bed;with call bell in reach General Behavior During Session: [facility] for tasks performed Cognition: [facility] for tasks performed  Next session: basic transfer Mod I, toilet transfer with 3N1, w/c education (could be addressed if delivered)   Lucile Shutters 11/11/2011, 12:00 PM  Pager: [phone]

## 2011-11-11 NOTE — Progress Notes (Addendum)
CARE MANAGEMENT NOTE 11/11/2011  Discharge planning. Spoke with patient.Choice for Home Health offered. Emotional support given. Will follow.

## 2011-11-11 NOTE — Progress Notes (Signed)
Physical Therapy Evaluation Patient Details Name: Crystal Houston MRN: [id number] DOB: February 23, 1971 Today's Date: 11/11/2011  Problem List:  Patient Active Problem List  Diagnoses  . ANXIETY  . ADJ DISORDER WITH MIXED ANXIETY & DEPRESSED MOOD  . DEPRESSION  . VAGINAL DISCHARGE  . OTHER ACUTE SINUSITIS  . Obesity    Past Medical History:  Past Medical History  Diagnosis Date  . Anxiety   . Depression   . Anxiety    Past Surgical History:  Past Surgical History  Procedure Date  . Fracture surgery     PT Assessment/Plan/Recommendation PT Assessment Clinical Impression Statement: Pt will benefit from acute PT services to address deficits in gait, strength, mobility, balance and activity tolerance and to increase functional independence for safe d/c home PT Recommendation/Assessment: Patient will need skilled PT in the acute care venue PT Problem List: Decreased strength;Decreased mobility;Decreased activity tolerance;Decreased knowledge of use of DME;Decreased balance;Decreased knowledge of precautions PT Therapy Diagnosis : Difficulty walking;Generalized weakness PT Plan PT Frequency: Min 5X/week PT Treatment/Interventions: DME instruction;Gait training;Stair training;Functional mobility training;Balance training;Therapeutic exercise;Therapeutic activities;Patient/family education;Wheelchair mobility training PT Recommendation Recommendations for Other Services: OT consult Follow Up Recommendations: Home health PT Equipment Recommended: Wheelchair (measurements);Wheelchair cushion (measurements) (18x18 elevating R leg rest) PT Goals  Acute Rehab PT Goals PT Goal Formulation: With patient Time For Goal Achievement: 7 days Pt will go Supine/Side to Sit: with modified independence Pt will Transfer Bed to Chair/Chair to Bed: with modified independence Pt will Ambulate: 16 - 50 feet;with modified independence Pt will Go Up / Down Stairs: 3-5 stairs;with supervision Pt will  Propel Wheelchair: 51 - 150 feet;with modified independence  PT Evaluation Precautions/Restrictions  Precautions Precautions: Fall Required Braces or Orthoses: Yes Other Brace/Splint: boot R ankle Restrictions Weight Bearing Restrictions: Yes RLE Weight Bearing: Touchdown weight bearing Prior Functioning  Home Living Lives With: Family Receives Help From: Family;Friend(s) Type of Home: House Home Layout: Able to live on main level with bedroom/bathroom;Two level Home Access: Stairs to enter Entrance Stairs-Rails: Can reach both Entrance Stairs-Number of Steps: 3 Home Adaptive Equipment: Walker - rolling Prior Function Level of Independence: Independent with basic ADLs;Independent with homemaking with ambulation;Independent with gait Driving: Yes Cognition Cognition Overall Cognitive Status: Appears within functional limits for tasks assessed Orientation Level: Oriented X4 Sensation/Coordination Sensation Light Touch: Appears Intact Proprioception: Appears Intact Extremity Assessment RLE Assessment RLE Assessment: Exceptions to [facility] RLE AROM (degrees) Overall AROM Right Lower Extremity:  (limited by boot, decreased strength) RLE Strength RLE Overall Strength:  (grossly 3-/5) LLE Assessment LLE Assessment: Within Functional Limits Mobility (including Balance) Bed Mobility Bed Mobility: Yes Supine to Sit: 4: Min assist Supine to Sit Details (indicate cue type and reason): cues to assist R LE Transfers Transfers: Yes Stand Pivot Transfers: 4: Min assist Stand Pivot Transfer Details (indicate cue type and reason): steadying assist for balance. pt able to maintain TDWB with cues Ambulation/Gait Ambulation/Gait: Yes Ambulation/Gait Assistance: 4: Min assist Ambulation/Gait Assistance Details (indicate cue type and reason): steadying assist for balance, increased fatigue with "hopping" gait pattern Ambulation Distance (Feet): 10 Feet Assistive device: Rolling  walker Stairs: No Wheelchair Mobility Wheelchair Mobility: No  Posture/Postural Control Posture/Postural Control: No significant limitations Balance Balance Assessed: Yes Static Sitting Balance Static Sitting - Level of Assistance: 6: Modified independent (Device/Increase time) Dynamic Sitting Balance Dynamic Sitting - Level of Assistance: 5: Stand by assistance Static Standing Balance Static Standing - Level of Assistance: 4: Min assist (with RW) Dynamic Standing Balance Dynamic Standing - Level of  Assistance: 4: Min assist (with RW) End of Session PT - End of Session Equipment Utilized During Treatment: Gait belt Activity Tolerance: Patient tolerated treatment well Patient left: in chair;with call bell in reach Nurse Communication: Mobility status for transfers General Behavior During Session: [facility] for tasks performed Cognition: [facility] for tasks performed  DONAWERTH,KAREN 11/11/2011, 10:13 AM

## 2011-11-11 NOTE — Op Note (Signed)
11/10/2011 - 11/11/2011  1:50 AM  PATIENT:  Crystal Houston  40 y.o. female  PRE-OPERATIVE DIAGNOSIS:  Open Right Fracture/Dislocation  POST-OPERATIVE DIAGNOSIS:  Open right ankle fracture /dislocation  PROCEDURE:  Procedure(s): OPEN REDUCTION INTERNAL FIXATION (ORIF) ANKLE FRACTURE IRRIGATION AND DEBRIDEMENT EXTREMITY  SURGEON:  Surgeon(s): Gus Rankin Crystal Houston  PHYSICIAN ASSISTANT:   ASSISTANTS: Crystal Peace, PA-C   ANESTHESIA:   general  EBL:  Total I/O In: 2100 [I.V.:2100] Out: 350 [Urine:350]  BLOOD ADMINISTERED:none  DRAINS: none   LOCAL MEDICATIONS USED:  NONE  SPECIMEN:  No Specimen  DISPOSITION OF SPECIMEN:  N/A  COUNTS:  YES  TOURNIQUET:   Total Tourniquet Time Documented: Thigh (Right) - 56 minutes  DICTATION: .Other Dictation: Dictation Number [id number]  PLAN OF CARE: Admit to inpatient   PATIENT DISPOSITION:  PACU - hemodynamically stable.   Delay start of Pharmacological VTE agent (>24hrs) due to surgical blood loss or risk of bleeding:  Yes  Gus Rankin. Jackalyn Haith, MD    11/11/2011, 1:51 AM

## 2011-11-11 NOTE — Transfer of Care (Signed)
Immediate Anesthesia Transfer of Care Note  Patient: Crystal Houston  Procedure(s) Performed:  OPEN REDUCTION INTERNAL FIXATION (ORIF) ANKLE FRACTURE - with screw applicaton; IRRIGATION AND DEBRIDEMENT EXTREMITY  Patient Location: PACU  Anesthesia Type: General  Level of Consciousness: awake, sedated and patient cooperative  Airway & Oxygen Therapy: Patient Spontanous Breathing and Patient connected to nasal cannula oxygen  Post-op Assessment: Report given to PACU RN, Post -op Vital signs reviewed and stable and Patient moving all extremities  Post vital signs: Reviewed and stable  Complications: No apparent anesthesia complications

## 2011-11-11 NOTE — Progress Notes (Signed)
Subjective: 1 Day Post-Op Procedure(s) (LRB): OPEN REDUCTION INTERNAL FIXATION (ORIF) ANKLE FRACTURE (Right) IRRIGATION AND DEBRIDEMENT EXTREMITY (Right) Patient reports pain as mild and greatly improved compared to pre-op.   We will start therapy today. Plan is to go home after hospital stay.  Objective: Vital signs in last 24 hours: Temp:  [97.5 F (36.4 C)-98.4 F (36.9 C)] 98.4 F (36.9 C) (12/07 0512) Pulse Rate:  [80-115] 107  (12/07 0512) Resp:  [13-24] 18  (12/07 0512) BP: (110-129)/(56-82) 127/73 mmHg (12/07 0512) SpO2:  [93 %-99 %] 96 % (12/07 0512) FiO2 (%):  [28 %] 28 % (12/07 0439)  Intake/Output from previous day:  Intake/Output Summary (Last 24 hours) at 11/11/11 0805 Last data filed at 11/11/11 0513  Gross per 24 hour  Intake   2350 ml  Output    750 ml  Net   1600 ml    Intake/Output this shift:    Labs: Results for orders placed during the hospital encounter of 11/10/11  CBC      Component Value Range   WBC 8.7  4.0 - 10.5 (K/uL)   RBC 4.37  3.87 - 5.11 (MIL/uL)   Hemoglobin 12.7  12.0 - 15.0 (g/dL)   HCT 16.1  09.6 - 04.5 (%)   MCV 87.0  78.0 - 100.0 (fL)   MCH 29.1  26.0 - 34.0 (pg)   MCHC 33.4  30.0 - 36.0 (g/dL)   RDW 40.9  81.1 - 91.4 (%)   Platelets 323  150 - 400 (K/uL)  POCT I-STAT, CHEM 8      Component Value Range   Sodium 141  135 - 145 (mEq/L)   Potassium 3.9  3.5 - 5.1 (mEq/L)   Chloride 104  96 - 112 (mEq/L)   BUN 11  6 - 23 (mg/dL)   Creatinine, Ser 7.82  0.50 - 1.10 (mg/dL)   Glucose, Bld 956 (*) 70 - 99 (mg/dL)   Calcium, Ion 2.13  0.86 - 1.32 (mmol/L)   TCO2 26  0 - 100 (mmol/L)   Hemoglobin 12.9  12.0 - 15.0 (g/dL)   HCT 57.8  46.9 - 62.9 (%)    Exam - Neurologically intact Neurovascular intact Dorsiflexion/Plantar flexion intact Compartment soft Dressing - clean, dry, no drainage Motor function intact - moving  toes well on exam. Toes pink with normal cap refill. Sensation intact.   Assessment/Plan: 1 Day Post-Op  Procedure(s) (LRB): OPEN REDUCTION INTERNAL FIXATION (ORIF) ANKLE FRACTURE (Right) IRRIGATION AND DEBRIDEMENT EXTREMITY (Right)  Past Medical History  Diagnosis Date  . Anxiety   . Depression   . Anxiety     Advance diet Up with therapy Discharge home with home health once safe and ready  DVT Prophylaxis - Xarelto Protocol TDWB to right leg Keep foley until tomorrow. Dressing change tomorrow D/C O2 and Pulse OX and try on Room Air  Kashia Brossard V 11/11/2011, 8:05 AM

## 2011-11-11 NOTE — Anesthesia Postprocedure Evaluation (Signed)
  Anesthesia Post-op Note  Patient: Crystal Houston  Procedure(s) Performed:  OPEN REDUCTION INTERNAL FIXATION (ORIF) ANKLE FRACTURE - with screw applicaton; IRRIGATION AND DEBRIDEMENT EXTREMITY  Patient Location: PACU  Anesthesia Type: General  Level of Consciousness: awake, alert , oriented and patient cooperative  Airway and Oxygen Therapy: Patient Spontanous Breathing and Patient connected to nasal cannula oxygen  Post-op Pain: mild  Post-op Assessment: Post-op Vital signs reviewed, Patient's Cardiovascular Status Stable, Respiratory Function Stable, Patent Airway, No signs of Nausea or vomiting and Pain level controlled  Post-op Vital Signs: stable  Complications: No apparent anesthesia complications

## 2011-11-11 NOTE — Op Note (Signed)
NAMEGENEVIE, ELMAN NO.:  [id number]  MEDICAL RECORD NO.:  [id number]  LOCATION:  5001                         FACILITY:  [facility]  PHYSICIAN:  Ollen Gross, M.D.    DATE OF BIRTH:  [birth year]  DATE OF PROCEDURE:  11/11/2011 DATE OF DISCHARGE:                              OPERATIVE REPORT   PREOPERATIVE DIAGNOSIS:  Open right bimalleolar ankle fracture dislocation.  POSTOPERATIVE DIAGNOSIS:  Open right bimalleolar ankle fracture dislocation.  PROCEDURE:  Irrigation and debridement and open reduction and internal fixation right ankle fracture dislocation.  SURGEON:  Ollen Gross, MD  ASSISTANT:  Alexzandrew L. Julien Girt, PA-C  ANESTHESIA:  General.  ESTIMATED BLOOD LOSS:  Minimal.  DRAINS:  None.  TOURNIQUET TIME:  52 minutes at 300 mmHg.  COMPLICATIONS:  None.  CONDITION:  Stable to recovery.  BRIEF CLINICAL INDICATION:  [name] is a 40 year old female who was playing with her children generally, jumped off a porch sustaining immediate twisting injury and deformity to her right ankle.  She was taken to the [facility] Emergency Room where there was an open laceration medially and an ankle fracture dislocation with at least bimalleolar component.  We evaluated her in the emergency room and took emergently to the operating room where reduction and internal fixation was performed after irrigation and debridement.  PROCEDURE IN DETAIL:  After successful administration of general anesthetic, I reduced the fracture to take tension off the skin.  We then placed a tourniquet high on her right thigh and right lower extremity was prepped and draped in the usual sterile fashion. Extremities wrapped in Esmarch, tourniquet inflated to 300 mmHg.  She had about a 2-cm laceration over the tip of the medial malleolar fracture.  I did a curvilinear incision incorporating this.  Thoroughly irrigated with at least a liter of saline.  This was a very clean  wound. There was no contamination present.  I was able to look inside the joint, there was 1 tiny loose fragment, which was removed.  We then anatomically reduced the medial malleolus and placed 2 guide pins for the 4.0 cannulated screws.  15 mm cannulated screws were then passed over the guide pins anatomically reducing that medial malleolar fragment.  We then irrigated further.  The fibula fracture is about 5 or 6 cm above the syndesmosis.  I made an incision over this area carefully dissecting subcutaneously and protecting the superficial peroneal nerve.  Fracture was identified and anatomically reduced.  A five-hole 1/3 tubular plate was placed, 4 screws were placed, 2 proximal and 2 distal to the fracture, each with excellent purchase.  C-arm views were taken, AP, lateral, and mortise showing anatomic reduction of fracture.  There was a very small posterior malleolar fragment, which was anatomically reduced.  Wounds were then copiously irrigated with saline solution.  Lateral wound was closed interrupted 2-0 Vicryl, and skin closed with staples.  The medial wound was closed subcu interrupted 2-0 Vicryl, and skin closed with interrupted 4-0 nylon.  No evidence of any significant tension on the wound edges.  The wounds were cleaned and dried and bulky sterile dressing applied.  She was placed into Cam walker, awakened, and transported to recovery in  stable condition.     Ollen Gross, M.D.     FA/MEDQ  D:  11/11/2011  T:  11/11/2011  Job:  [id number]

## 2011-11-11 NOTE — Anesthesia Procedure Notes (Signed)
Performed by: Karmella Bouvier     

## 2011-11-12 MED ORDER — METHOCARBAMOL 500 MG PO TABS: 500.0000 mg | ORAL_TABLET | Freq: Four times a day (QID) | ORAL | Status: AC | PRN

## 2011-11-12 MED ORDER — ESCITALOPRAM OXALATE 20 MG PO TABS: 20.0000 mg | ORAL_TABLET | Freq: Every day | ORAL | Status: DC

## 2011-11-12 MED ORDER — OXYCODONE HCL 5 MG PO TABS: 5.0000 mg | ORAL_TABLET | ORAL | Status: AC | PRN

## 2011-11-12 NOTE — Progress Notes (Signed)
Physical Therapy Treatment Patient Details Name: Crystal Houston MRN: [id number] DOB: [birth year] Today's Date: 11/12/2011  PT Assessment/Plan  PT - Assessment/Plan Comments on Treatment Session: Pt with orders for d/c home today.  Pt provided with crutches to use with ascend/descend stairs.  Pt may progress to using crutches for ambulation as she recovers. PT Plan: Discharge plan remains appropriate PT Frequency: Min 5X/week Follow Up Recommendations: Home health PT Equipment Recommended: Wheelchair (measurements);Wheelchair cushion (measurements);Rolling walker with 5" wheels PT Goals  Acute Rehab PT Goals PT Goal: Supine/Side to Sit - Progress: Met PT Transfer Goal: Bed to Chair/Chair to Bed - Progress: Progressing toward goal PT Goal: Ambulate - Progress: Progressing toward goal PT Goal: Up/Down Stairs - Progress: Progressing toward goal PT Goal: Propel Wheelchair - Progress: Progressing toward goal  PT Treatment Precautions/Restrictions  Precautions Precautions: Fall Required Braces or Orthoses: Yes Other Brace/Splint: right ankle boot Restrictions Weight Bearing Restrictions: Yes RLE Weight Bearing: Touchdown weight bearing Mobility (including Balance) Bed Mobility Bed Mobility: Yes Supine to Sit: 6: Modified independent (Device/Increase time) Supine to Sit Details (indicate cue type and reason): HOB flat with no use of rails to prepare for home environment Transfers Sit to Stand: 6: Modified independent (Device/Increase time) Stand to Sit: 6: Modified independent (Device/Increase time) Stand Pivot Transfers: 5: Supervision Ambulation/Gait Ambulation/Gait Assistance: 4: Min assist Ambulation/Gait Assistance Details (indicate cue type and reason): min guard assist Ambulation Distance (Feet): 25 Feet Assistive device: Rolling walker Gait Pattern: Step-to pattern Stairs: Yes Stairs Assistance: 4: Min assist Stairs Assistance Details (indicate cue type and reason):  demonstration and verbal cues for sequencing Stair Management Technique: With crutches Number of Stairs: 3  Height of Stairs: 4     Exercise    End of Session PT - End of Session Equipment Utilized During Treatment: Gait belt Activity Tolerance: Patient tolerated treatment well Patient left: in bed;with family/visitor present;with call bell in reach Nurse Communication: Mobility status for transfers;Mobility status for ambulation General Behavior During Session: [facility] for tasks performed Cognition: [facility] for tasks performed  Ilda Foil 11/12/2011, 3:10 PM  Aida Raider, PT  Office # [phone] Pager [phone]

## 2011-11-12 NOTE — Progress Notes (Addendum)
CARE MANAGEMENT NOTE 11/12/2011  Patient:  Crystal Houston, Crystal Houston   Account Number:  [id number]  Date Initiated:  11/11/2011  Documentation initiated by:  Vance Peper  Subjective/Objective Assessment:   40 yr old female s/p right ankle ORIF     Action/Plan:   Discharge planning. Spoke with patient.Choice offered. Emotional support given.   Anticipated DC Date:  11/13/2011   Anticipated DC Plan:  HOME W HOME HEALTH SERVICES      DC Planning Services  CM consult      PAC Choice  DURABLE MEDICAL EQUIPMENT  HOME HEALTH   Choice offered to / List presented to:  C-1 Patient   DME arranged  WHEELCHAIR - MANUAL  WALKER - ROLLING      DME agency  [facility]     HH arranged  HH-2 PT      [facility] agency  [facility]   Status of service:  Completed, signed off Medicare Important Message given?   (If response is "NO", the following Medicare IM given date fields will be blank) Date Medicare IM given:   Date Additional Medicare IM given:    Discharge Disposition:  HOME W HOME HEALTH SERVICES  Per UR Regulation:    Comments:  11/12/2011 1655 Contacted pt and provided contact information for [facility]. Isidoro Donning RN CCM Case Mgmt phone [phone]  11/12/2011 1655 Contacted [facility] for RW for home. [facility] cannot provide services for [facility] PT. Spoke to pt to make aware and she ok with agency that will accept her insurance for [facility] PT. Contacted [facility] and they do accept [employer]. Faxed orders. Isidoro Donning RN CCM Case Mgmt phone [phone]  11/12/2011 1200 Contacted [facility] for manual wheelchair. Isidoro Donning RN CCM Case Mgmt phone [phone]

## 2011-11-12 NOTE — Progress Notes (Signed)
Occupational Therapy Treatment Patient Details Name: Crystal Houston MRN: [id number] DOB: 25-Apr-1971 Today's Date: 11/12/2011  OT Assessment/Plan OT Assessment/Plan OT Plan: Discharge plan remains appropriate OT Frequency: Min 2X/week Follow Up Recommendations: None Equipment Recommended: Wheelchair (measurements);Wheelchair cushion (measurements) OT Goals Acute Rehab OT Goals Time For Goal Achievement: 7 days Miscellaneous OT Goals Miscellaneous OT Goal #1: goal met Miscellaneous OT Goal #2: not addressed Miscellaneous OT Goal #3: progressing toward goal, able to complete stand-pivot to bsc mod i  OT Treatment Precautions/Restrictions  Precautions Precautions: Fall Required Braces or Orthoses: Yes Other Brace/Splint: right ankle boot Restrictions Weight Bearing Restrictions: Yes RLE Weight Bearing: Touchdown weight bearing   ADL ADL Toilet Transfer: Performed;Modified independent Toilet Transfer Method: Stand pivot Acupuncturist: Set designer - Clothing Manipulation: Performed Where Assessed - Toileting Clothing Manipulation: Sit to stand from 3-in-1 or toilet Toileting - Hygiene: Simulated;Modified independent ADL Comments: educated and demonstrated safe bed mobility and was able to scoot to hob to make sit/supine easier, difficult to get RLE in comfortable position with multiple attempts and re-positioning of pillows and angle of elevation Mobility  Bed Mobility Bed Mobility: Yes Supine to Sit: 5: Supervision Supine to Sit Details (indicate cue type and reason): HOB flat with no use of rails to prepare for home environment Transfers Transfers: Yes Sit to Stand: 6: Modified independent (Device/Increase time);From elevated surface;With upper extremity assist;From bed Stand to Sit: 6: Modified independent (Device/Increase time);To chair/3-in-1;Without upper extremity assist Exercises    End of Session OT - End of Session Activity  Tolerance: Patient tolerated treatment well Patient left: in bed;with call bell in reach General Behavior During Session: [facility] for tasks performed Cognition: [facility] for tasks performed  Robet Leu COTA/L 11/12/2011, 12:06 PM

## 2011-11-12 NOTE — Progress Notes (Signed)
Patient ID: Crystal Houston, female   DOB: [birth year], 40 y.o.   MRN: [id number] Afebrile.  Comfortable on po po meds.  Able to ambulate with walker and desire to go home.Will discharge on oxycodone, Robaxin to return to office in 4-5 days for dressing change.  [name]

## 2011-11-14 MED FILL — Cefazolin in D5W Inj 1 GM/50ML: INTRAVENOUS | Qty: 50 | Status: AC

## 2011-11-15 ENCOUNTER — Encounter (HOSPITAL_COMMUNITY): Payer: Self-pay | Admitting: Orthopedic Surgery

## 2011-11-16 NOTE — Discharge Summary (Signed)
Physician Discharge Summary   Patient ID: ALVENIA TREESE MRN: [id number] DOB/AGE: May 27, 1971 40 y.o.  Admit date: 11/10/2011 Discharge date: 11/12/2011  Primary Diagnosis: Open Fracture/Dislocation Right Ankle  Admission Diagnoses: Past Medical History  Diagnosis Date  . Anxiety   . Depression   . Anxiety     Discharge Diagnoses:  Active Problems:  * No active hospital problems. *    Procedure: Procedure(s) (LRB): OPEN REDUCTION INTERNAL FIXATION (ORIF) ANKLE FRACTURE (Right) IRRIGATION AND DEBRIDEMENT EXTREMITY (Right)   Consults: none  HPI: Patient is a 40 year old female who sustained a bad ankle fracture earlier today when she jumped off her porch. She had immediate onset of pain and also a laceration on the inner sider of her ankle from the break. She was brought to [facility] ED where she was found to have an ankle fracture dislocation with an open wound. She was evaluated by Dr. Lequita Halt and admitted for surgical intervention.  Laboratory Data: Abstract on 04/30/2011  Component Date Value Range Status  . HM Mammogram  12/21/2010 ordered   Final  . HM Pap smear  12/21/2010 done   Final   No results found for this basename: HGB:5 in the last 72 hours No results found for this basename: WBC:2,RBC:2,HCT:2,PLT:2 in the last 72 hours No results found for this basename: NA:2,K:2,CL:2,CO2:2,BUN:2,CREATININE:2,GLUCOSE:2,CALCIUM:2 in the last 72 hours No results found for this basename: LABPT:2,INR:2 in the last 72 hours  X-Rays:Dg Ankle Complete Right  11/11/2011  *RADIOLOGY REPORT*  Clinical Data: ORIF right ankle fracture.  RIGHT ANKLE - COMPLETE 3+ VIEW  Comparison: 11/10/2011  Findings: Internal fixation with plate screw fixation across the distal fibular fracture and two screws in the medial malleolus. Interval reduction of the dislocation.  The posterior malleolar fragment remains mildly displaced on the lateral view.  IMPRESSION: Interval reduction and fixation as above.   Original Report Authenticated By: Cyndie Chime, M.D.   Dg Chest Port 1 View  11/10/2011  *RADIOLOGY REPORT*  Clinical Data: Preoperative chest radiograph for right ankle fracture.  PORTABLE CHEST - 1 VIEW  Comparison: None.  Findings: The lungs are well-aerated.  There is elevation of the right hemidiaphragm; vascular crowding and likely mild vascular congestion are noted.  There is no evidence of focal opacification, pleural effusion or pneumothorax.  The cardiomediastinal silhouette is mildly enlarged.  No acute osseous abnormalities are seen.  IMPRESSION: Mild vascular congestion and mild cardiomegaly noted; lungs remain grossly clear.  Original Report Authenticated By: Tonia Ghent, M.D.   Dg Ankle Right Port  11/10/2011  *RADIOLOGY REPORT*  Clinical Data: Open ankle fracture.  Fall, deformity.  Pain.  PORTABLE RIGHT ANKLE - 2 VIEW  Comparison: None.  Findings: Fracture dislocation at the right ankle.  There is a transverse fracture through the distal right fibular shaft. Fracture through the medial and presumed posterior malleoli of the distal tibia.  The tibia is dislocated medial relative to the talus.  Fracture fragments are markedly displaced.  IMPRESSION: Fracture dislocation of the right ankle as above.  Distal fibular shaft fracture, angulated and displaced.  Original Report Authenticated By: Cyndie Chime, M.D.   Dg C-arm 1-60 Min  11/11/2011  CLINICAL DATA: fracture   C-ARM 1-60 MINUTES  Fluoroscopy was utilized by the requesting physician.  No radiographic  interpretation.      EKG:No orders found for this or any previous visit.   Hospital Course: Patient was admitted to [facility] and taken to the OR and underwent the above  state procedure without complications.  Patient tolerated the procedure well and was later transferred to the recovery room and then to the orthopaedic floor for postoperative care.  They were given PO and IV analgesics for pain control following their  surgery.  They were given 24 hours of postoperative antibiotics and started on DVT prophylaxis.   PT was ordered for therapy and mobilityl.  Discharge planning consulted to help with postop disposition and equipment needs.  Patient had a decent night on the evening of surgery and started to get up with therapy on day one with improved pain control from pre-op. She was able to walk short distances that first day.  Continued to progress with therapy into day two with improved mobility. Patient was seen in rounds and was ready to go home later that day after therapy.  Discharge Medications:  Prior to Admission medications   Medication Sig Start Date End Date Taking? Authorizing Provider  busPIRone (BUSPAR) 30 MG tablet 1 tablet by mouth twice daily (first two days, cut tablets in half to take 15 mg by mouth twice daily.) 08/10/11  Yes Ruthe Mannan, MD  clonazePAM (KLONOPIN) 0.5 MG tablet Take 0.5 mg by mouth 3 (three) times daily as needed. Take as needed for anxiety  08/10/11  Yes Ruthe Mannan, MD  escitalopram (LEXAPRO) 20 MG tablet Take 20 mg by mouth daily. Take 1 tablet every night.  08/10/11  Yes Ruthe Mannan, MD  escitalopram (LEXAPRO) 20 MG tablet Take 1 tablet (20 mg total) by mouth daily. Take 1 to 2 tablets daily as directed. 11/12/11   James P Aplington  methocarbamol (ROBAXIN) 500 MG tablet Take 1 tablet (500 mg total) by mouth every 6 (six) hours as needed. 11/12/11 11/22/11  Fayrene Fearing P Aplington  oxyCODONE (OXY IR/ROXICODONE) 5 MG immediate release tablet Take 1 tablet (5 mg total) by mouth every 4 (four) hours as needed. 11/12/11 11/22/11  Fayrene Fearing P Aplington    Diet: regular  Activity:NWB   Follow-up:in 5 days  Disposition: home  Discharged Condition: fair   Discharge Orders    Future Appointments: Provider: Department: Dept Phone: Center:   11/23/2011 11:00 AM Atilano Ina, MD [facility] [phone] None     Future Orders Please Complete By Expires   Diet - low sodium heart healthy       Call MD / Call 911      Comments:   If you experience chest pain or shortness of breath, CALL 911 and be transported to the hospital emergency room.  If you develope a fever above 101 F, pus (white drainage) or increased drainage or redness at the wound, or calf pain, call your surgeon's office.   Constipation Prevention      Comments:   Drink plenty of fluids.  Prune juice may be helpful.  You may use a stool softener, such as Colace (over the counter) 100 mg twice a day.  Use MiraLax (over the counter) for constipation as needed.   Increase activity slowly as tolerated      Weight Bearing as taught in Physical Therapy      Comments:   Use a walker or crutches as instructed.   Discharge instructions      Comments:   No weight rt leg   Driving restrictions      Comments:   No driving for 6 weeks     Discharge Medication List as of 11/12/2011 12:33 PM    START taking these medications   Details  methocarbamol (ROBAXIN) 500 MG tablet Take 1 tablet (500 mg total) by mouth every 6 (six) hours as needed., Starting 11/12/2011, Until Tue 11/22/11, Print    oxyCODONE (OXY IR/ROXICODONE) 5 MG immediate release tablet Take 1 tablet (5 mg total) by mouth every 4 (four) hours as needed., Starting 11/12/2011, Until Tue 11/22/11, Print      CONTINUE these medications which have CHANGED   Details  !! escitalopram (LEXAPRO) 20 MG tablet Take 1 tablet (20 mg total) by mouth daily. Take 1 to 2 tablets daily as directed., Starting 11/12/2011, Until Discontinued, Print     !! - Potential duplicate medications found. Please discuss with provider.    CONTINUE these medications which have NOT CHANGED   Details  busPIRone (BUSPAR) 30 MG tablet 1 tablet by mouth twice daily (first two days, cut tablets in half to take 15 mg by mouth twice daily.), Normal    clonazePAM (KLONOPIN) 0.5 MG tablet Take 0.5 mg by mouth 3 (three) times daily as needed. Take as needed for anxiety , Starting 08/10/2011, Until  Discontinued, Historical Med    !! escitalopram (LEXAPRO) 20 MG tablet Take 20 mg by mouth daily. Take 1 tablet every night. , Starting 08/10/2011, Until Discontinued, Historical Med     !! - Potential duplicate medications found. Please discuss with provider.    STOP taking these medications     cyclobenzaprine (FLEXERIL) 5 MG tablet        Follow-up Information    Follow up with Ruthe Mannan, MD .         Signed: Patrica Duel 11/16/2011, 10:22 PM

## 2011-11-23 ENCOUNTER — Ambulatory Visit (INDEPENDENT_AMBULATORY_CARE_PROVIDER_SITE_OTHER): Payer: 59 | Admitting: General Surgery

## 2012-02-23 ENCOUNTER — Ambulatory Visit (INDEPENDENT_AMBULATORY_CARE_PROVIDER_SITE_OTHER): Payer: 59 | Admitting: General Surgery

## 2012-02-23 ENCOUNTER — Encounter (INDEPENDENT_AMBULATORY_CARE_PROVIDER_SITE_OTHER): Payer: Self-pay | Admitting: General Surgery

## 2012-02-23 NOTE — Progress Notes (Signed)
Patient ID: Crystal Houston, female   DOB: 10-13-1971, 41 y.o.   MRN: [id number]  Chief Complaint  Patient presents with  . Pre-op Exam    lap band initial    HPI Crystal Houston is a 41 y.o. female.   HPI 41 year old obese Caucasian female referred by [name] for evaluation for weight loss surgery. The patient is specifically answered in laparoscopic adjustable gastric band surgery. She states that she has struggled pretty much her adult life with weight loss. Despite numerous attempts, she has been unsuccessful for sustained weight loss. She has tried Weight Watchers, phentermine, and a bariatric Center in [place]. She even hired a Systems analyst last year. The most weight she has lost with any of these endeavors has been 30 pounds which she subsequently regained. Past Medical History  Diagnosis Date  . Anxiety   . Depression   . Anxiety     Past Surgical History  Procedure Date  . Fracture surgery   . Orif ankle fracture 11/10/2011    Procedure: OPEN REDUCTION INTERNAL FIXATION (ORIF) ANKLE FRACTURE;  Surgeon: Loanne Drilling;  [facility]: [facility];  Service: Orthopedics;  Laterality: Right;  with screw applicaton  . I&d extremity 11/10/2011    Procedure: IRRIGATION AND DEBRIDEMENT EXTREMITY;  Surgeon: Loanne Drilling;  Location: [facility];  Service: Orthopedics;  Laterality: Right;    Family History  Problem Relation Age of Onset  . Cancer Daughter     osteosarcoma of leg  . Cancer Paternal Grandfather     colon    Social History History  Substance Use Topics  . Smoking status: Never Smoker   . Smokeless tobacco: Not on file  . Alcohol Use: 1.2 oz/week    2 Glasses of wine per week     month    No Known Allergies  Current Outpatient Prescriptions  Medication Sig Dispense Refill  . busPIRone (BUSPAR) 30 MG tablet 1 tablet by mouth twice daily (first two days, cut tablets in half to take 15 mg by mouth twice daily.)  30 tablet  3  . clonazePAM  (KLONOPIN) 0.5 MG tablet Take 0.5 mg by mouth 3 (three) times daily as needed. Take as needed for anxiety       . escitalopram (LEXAPRO) 20 MG tablet Take 20 mg by mouth daily. Take 1 tablet every night.         Review of Systems Review of Systems  Constitutional: Negative for fever, chills, activity change, appetite change and unexpected weight change.  HENT: Negative for hearing loss, congestion, sore throat, trouble swallowing, neck pain and voice change.   Eyes: Negative for visual disturbance.  Respiratory: Negative for cough and wheezing.   Cardiovascular: Negative for chest pain, palpitations and leg swelling.  Gastrointestinal: Negative for nausea, vomiting, abdominal pain, diarrhea, constipation, blood in stool, abdominal distention and anal bleeding.       Bm every other day  Genitourinary: Negative for dysuria, hematuria, vaginal bleeding and difficulty urinating.       G3P3; has IUD.   Musculoskeletal: Negative for arthralgias.       Has right knee pain -was told it was due to OA. Recent ankle fracture from fall  Skin: Negative for rash and wound.  Neurological: Negative for seizures, syncope, light-headedness and headaches.  Hematological: Negative for adenopathy. Does not bruise/bleed easily.  Psychiatric/Behavioral: Negative for suicidal ideas, confusion, self-injury and decreased concentration.       Daughter has osteosarcoma - has stress at  times and anxiety.     Blood pressure 112/78, pulse 74, temperature 97.7 F (36.5 C), temperature source Temporal, height 5\' 8"  (1.727 m), weight 232 lb 6.4 oz (105.416 kg), SpO2 98.00%.  Physical Exam Physical Exam  Vitals reviewed. Constitutional: She is oriented to person, place, and time. She appears well-developed and well-nourished. No distress.       obese  HENT:  Head: Normocephalic.  Right Ear: External ear normal.  Left Ear: External ear normal.  Eyes: Conjunctivae are normal. No scleral icterus.  Neck: Normal range  of motion. Neck supple. No tracheal deviation present. No thyromegaly present.  Cardiovascular: Normal rate, regular rhythm and normal heart sounds.   No murmur heard. Pulmonary/Chest: Effort normal and breath sounds normal. No respiratory distress. She has no wheezes.  Abdominal: Soft. Bowel sounds are normal. She exhibits no distension. There is no tenderness. There is no rebound.  Musculoskeletal: Normal range of motion. She exhibits no edema.       Has brace on RLE; o/w MAE wnl  Neurological: She is alert and oriented to person, place, and time. No cranial nerve deficit. She exhibits normal muscle tone.  Skin: Skin is warm and dry. No rash noted. She is not diaphoretic. No erythema.  Psychiatric: She has a normal mood and affect. Her behavior is normal. Thought content normal.    Data Reviewed Dr Elmer Sow note [facility] d/c summary from ankle fracture  Assessment    Obesity Right knee osteoarthritis Depression Anxiety    Plan     We discussed laparoscopic adjustable gastric banding. The patient was given Agricultural engineer. We discussed the risk and benefits of surgery including but not limited to bleeding, infection, injury to surrounding structures, blood clot formation such as deep venous thrombosis or pulmonary embolism, need to convert to an open procedure, band slippage, band erosion, failure to loose weight, port complications (leak or flippage), potential need for reoperative surgery, esophageal dilatation, worsening reflux, and vitamin deficiencies. We discussed the typical post operative recovery course. We discussed that their postoperative diet will be modified for several weeks. We specifically talked about the need to be on a liquid diet for one to 2 weeks after surgery. We also discussed the typical postoperative course with a laparoscopic adjustable gastric band and the need for frequent postoperative visits to assess the volume status of the band.  We discussed the  typical expected weight loss with a laparoscopic adjustable gastric band. I explained to the patient that they can expect to lose 40-60% of their excess body weight if they are compliant with their postoperative instructions. However I did explain that some patients loose less than 40% and some patients lose more than 60% of their excess body weight.  I explained that the likelihood of improvement in their obesity is good if they follow our recommendations.  I discussed our pathway for weight loss surgery. I explained that we will check some routine labs, upper GI, abdominal ultrasound, nutritional consultation, and psychiatric consultation. Once we have these studies and consults performed we will submit her packet to her insurance company for evaluation.  Mary Sella. Andrey Campanile, MD, FACS General, Bariatric, & Minimally Invasive Surgery [facility], [place]        [facility] M 02/23/2012, 6:52 PM

## 2012-03-01 ENCOUNTER — Other Ambulatory Visit: Payer: Self-pay | Admitting: Family Medicine

## 2012-03-01 DIAGNOSIS — Z136 Encounter for screening for cardiovascular disorders: Secondary | ICD-10-CM

## 2012-03-01 DIAGNOSIS — Z0000 Encounter for general adult medical examination without abnormal findings: Secondary | ICD-10-CM | POA: Insufficient documentation

## 2012-03-06 ENCOUNTER — Telehealth: Payer: Self-pay | Admitting: Family Medicine

## 2012-03-06 NOTE — Telephone Encounter (Signed)
I'm not doing Mirena insertions currently so she would need to go to GYN.

## 2012-03-06 NOTE — Telephone Encounter (Signed)
Patient has an appointment for a physical on 03/13/12.  It is time for her Mirena to be changed.  Can you change it at her physical appointment or does she need to go back to her gynecologist to have it done?

## 2012-03-07 ENCOUNTER — Ambulatory Visit (INDEPENDENT_AMBULATORY_CARE_PROVIDER_SITE_OTHER): Payer: 59 | Admitting: Family Medicine

## 2012-03-07 ENCOUNTER — Other Ambulatory Visit (INDEPENDENT_AMBULATORY_CARE_PROVIDER_SITE_OTHER): Payer: 59

## 2012-03-07 ENCOUNTER — Encounter: Payer: Self-pay | Admitting: Family Medicine

## 2012-03-07 VITALS — BP 110/80 | HR 80 | Temp 98.0°F | Wt 236.0 lb

## 2012-03-07 DIAGNOSIS — Z0000 Encounter for general adult medical examination without abnormal findings: Secondary | ICD-10-CM

## 2012-03-07 DIAGNOSIS — Z136 Encounter for screening for cardiovascular disorders: Secondary | ICD-10-CM

## 2012-03-07 DIAGNOSIS — R300 Dysuria: Secondary | ICD-10-CM

## 2012-03-07 LAB — POCT URINALYSIS DIPSTICK
Ketones, UA: NEGATIVE
Leukocytes, UA: NEGATIVE
Nitrite, UA: NEGATIVE
Urobilinogen, UA: NEGATIVE
pH, UA: 6

## 2012-03-07 NOTE — Progress Notes (Signed)
SUBJECTIVE: Crystal Houston is a 41 y.o. female who complains of dysuria x 2 days, without increased urinary frequency, flank pain, fever, chills, or abnormal vaginal discharge or bleeding.   Did have intercourse shortly before these symptoms started.  Patient Active Problem List  Diagnoses  . ANXIETY  . ADJ DISORDER WITH MIXED ANXIETY & DEPRESSED MOOD  . DEPRESSION  . VAGINAL DISCHARGE  . OTHER ACUTE SINUSITIS  . Obesity  . Routine general medical examination at a health care facility   Past Medical History  Diagnosis Date  . Anxiety   . Depression   . Anxiety    Past Surgical History  Procedure Date  . Fracture surgery   . Orif ankle fracture 11/10/2011    Procedure: OPEN REDUCTION INTERNAL FIXATION (ORIF) ANKLE FRACTURE;  Surgeon: Loanne Drilling;  [facility];  Service: Orthopedics;  Laterality: Right;  with screw applicaton  . I&d extremity 11/10/2011    Procedure: IRRIGATION AND DEBRIDEMENT EXTREMITY;  Surgeon: Loanne Drilling;  [facility]: [facility];  Service: Orthopedics;  Laterality: Right;   History  Substance Use Topics  . Smoking status: Never Smoker   . Smokeless tobacco: Not on file  . Alcohol Use: 1.2 oz/week    2 Glasses of wine per week     month   Family History  Problem Relation Age of Onset  . Cancer Daughter     osteosarcoma of leg  . Cancer Paternal Grandfather     colon   No Known Allergies Current Outpatient Prescriptions on File Prior to Visit  Medication Sig Dispense Refill  . busPIRone (BUSPAR) 30 MG tablet 1 tablet by mouth twice daily (first two days, cut tablets in half to take 15 mg by mouth twice daily.)  30 tablet  3  . clonazePAM (KLONOPIN) 0.5 MG tablet Take 0.5 mg by mouth 3 (three) times daily as needed. Take as needed for anxiety       . escitalopram (LEXAPRO) 20 MG tablet Take 20 mg by mouth daily. Take 1 tablet every night.        The PMH, PSH, Social History, Family History, Medications, and allergies have been reviewed in [facility],  and have been updated if relevant.  OBJECTIVE: BP 110/80  Pulse 80  Temp(Src) 98 F (36.7 C) (Oral)  Wt 236 lb (107.049 kg)   Appears well, in no apparent distress.  Vital signs are normal. The abdomen is soft without tenderness, guarding, mass, rebound or organomegaly. No CVA tenderness or inguinal adenopathy noted. Urine dipstick shows positive for RBC's.   ASSESSMENT/PLAN:  1.  Dysuria- UA neg except for trace hematuria. Will send for cx but unlikely UTI. Possible small external abrasion. Pt to call if symptoms progress. The patient indicates understanding of these issues and agrees with the plan.

## 2012-03-07 NOTE — Progress Notes (Signed)
Addended by: Baldomero Lamy on: 03/07/2012 02:18 PM   Modules accepted: Orders

## 2012-03-08 LAB — LIPID PANEL WITH LDL/HDL RATIO
Cholesterol, Total: 171 mg/dL (ref 100–199)
HDL: 39 mg/dL — ABNORMAL LOW (ref >39)
VLDL Cholesterol Cal: 20 mg/dL (ref 5–40)

## 2012-03-08 LAB — COMPREHENSIVE METABOLIC PANEL
ALT: 14 IU/L (ref 0–32)
AST: 15 IU/L (ref 0–40)
CO2: 22 mmol/L (ref 20–32)
Calcium: 8.9 mg/dL (ref 8.7–10.2)
Chloride: 105 mmol/L (ref 97–108)
Glucose: 90 mg/dL (ref 65–99)
Potassium: 4.1 mmol/L (ref 3.5–5.2)
Sodium: 140 mmol/L (ref 134–144)
Total Protein: 6.9 g/dL (ref 6.0–8.5)

## 2012-03-08 LAB — URINE CULTURE

## 2012-03-13 ENCOUNTER — Encounter: Payer: Self-pay | Admitting: Family Medicine

## 2012-03-13 ENCOUNTER — Ambulatory Visit (INDEPENDENT_AMBULATORY_CARE_PROVIDER_SITE_OTHER): Payer: 59 | Admitting: Family Medicine

## 2012-03-13 ENCOUNTER — Other Ambulatory Visit (HOSPITAL_COMMUNITY)
Admission: RE | Admit: 2012-03-13 | Discharge: 2012-03-13 | Disposition: A | Discharge status: Home / Self Care | Payer: 59 | Source: Ambulatory Visit | Attending: Family Medicine | Admitting: Family Medicine

## 2012-03-13 VITALS — BP 120/82 | HR 76 | Temp 98.0°F | Ht 68.5 in | Wt 232.0 lb

## 2012-03-13 DIAGNOSIS — Z1231 Encounter for screening mammogram for malignant neoplasm of breast: Secondary | ICD-10-CM

## 2012-03-13 DIAGNOSIS — Z124 Encounter for screening for malignant neoplasm of cervix: Secondary | ICD-10-CM

## 2012-03-13 DIAGNOSIS — F411 Generalized anxiety disorder: Secondary | ICD-10-CM

## 2012-03-13 DIAGNOSIS — Z01419 Encounter for gynecological examination (general) (routine) without abnormal findings: Secondary | ICD-10-CM | POA: Insufficient documentation

## 2012-03-13 DIAGNOSIS — N898 Other specified noninflammatory disorders of vagina: Secondary | ICD-10-CM | POA: Insufficient documentation

## 2012-03-13 DIAGNOSIS — Z113 Encounter for screening for infections with a predominantly sexual mode of transmission: Secondary | ICD-10-CM | POA: Insufficient documentation

## 2012-03-13 DIAGNOSIS — Z0000 Encounter for general adult medical examination without abnormal findings: Secondary | ICD-10-CM

## 2012-03-13 MED ORDER — CLONAZEPAM 0.5 MG PO TABS: 0.5000 mg | ORAL_TABLET | Freq: Three times a day (TID) | ORAL | Status: DC | PRN

## 2012-03-13 MED ORDER — BUSPIRONE HCL 30 MG PO TABS: ORAL_TABLET | ORAL | Status: DC

## 2012-03-13 MED ORDER — ESCITALOPRAM OXALATE 20 MG PO TABS: 20.0000 mg | ORAL_TABLET | Freq: Every day | ORAL | Status: DC

## 2012-03-13 MED ORDER — FLUCONAZOLE 150 MG PO TABS: 150.0000 mg | ORAL_TABLET | Freq: Once | ORAL | Status: AC

## 2012-03-13 NOTE — Patient Instructions (Signed)
Hang in there. It was so good to see you. Please stop by to see Shirlee Limerick on your way out to set up your mammogram.

## 2012-03-13 NOTE — Progress Notes (Signed)
Addended by: Eliezer Bottom on: 03/13/2012 10:18 AM   Modules accepted: Orders

## 2012-03-13 NOTE — Progress Notes (Signed)
Subjective:    Patient ID: Crystal Houston, female    DOB: 04-Sep-1971, 41 y.o.   MRN: [id number]  HPI  41 yo here for CPX. Due for pap. Has mirena IUD, Dr. Senaida Ores will be replacing it in October. No h/o breast CA. Due for mammogram. She has had some increased vaginal discharge- thick, white.  Anxiety- Going through divorce and daughter has another spot on her lung- now on MTX. She has been more tearful lately but feels her medication is working well and would like refills on her Buspar, Lexapro and Konopin. Has a good support system. No SI or HI.  Lab Results  Component Value Date   HDL 39* 03/07/2012   LDLCALC 112* 03/07/2012   TRIG 101 03/07/2012     Patient Active Problem List  Diagnoses  . ANXIETY  . ADJ DISORDER WITH MIXED ANXIETY & DEPRESSED MOOD  . DEPRESSION  . VAGINAL DISCHARGE  . OTHER ACUTE SINUSITIS  . Obesity  . Routine general medical examination at a health care facility   Past Medical History  Diagnosis Date  . Anxiety   . Depression   . Anxiety    Past Surgical History  Procedure Date  . Fracture surgery   . Orif ankle fracture 11/10/2011    Procedure: OPEN REDUCTION INTERNAL FIXATION (ORIF) ANKLE FRACTURE;  Surgeon: Loanne Drilling;  Location: [facility];  Service: Orthopedics;  Laterality: Right;  with screw applicaton  . I&d extremity 11/10/2011    Procedure: IRRIGATION AND DEBRIDEMENT EXTREMITY;  Surgeon: Loanne Drilling;  Location: [facility];  Service: Orthopedics;  Laterality: Right;   History  Substance Use Topics  . Smoking status: Never Smoker   . Smokeless tobacco: Not on file  . Alcohol Use: 1.2 oz/week    2 Glasses of wine per week     month   Family History  Problem Relation Age of Onset  . Cancer Daughter     osteosarcoma of leg  . Cancer Paternal Grandfather     colon   No Known Allergies Current Outpatient Prescriptions on File Prior to Visit  Medication Sig Dispense Refill  . busPIRone (BUSPAR) 30 MG tablet 1 tablet by mouth  twice daily (first two days, cut tablets in half to take 15 mg by mouth twice daily.)  30 tablet  3  . clonazePAM (KLONOPIN) 0.5 MG tablet Take 0.5 mg by mouth 3 (three) times daily as needed. Take as needed for anxiety       . escitalopram (LEXAPRO) 20 MG tablet Take 20 mg by mouth daily. Take 1 tablet every night.        The PMH, PSH, Social History, Family History, Medications, and allergies have been reviewed in [facility], and have been updated if relevant.   Review of Systems See HPI Patient reports no  vision/ hearing changes,anorexia, weight change, fever ,adenopathy, persistant / recurrent hoarseness, swallowing issues, chest pain, edema,persistant / recurrent cough, hemoptysis, dyspnea(rest, exertional, paroxysmal nocturnal), gastrointestinal  bleeding (melena, rectal bleeding), abdominal pain, excessive heart burn, GU symptoms(dysuria, hematuria, pyuria, voiding/incontinence  Issues) syncope, focal weakness, severe memory loss, concerning skin lesions, depression, anxiety, abnormal bruising/bleeding, major joint swelling, breast masses or abnormal vaginal bleeding.       Objective:   Physical Exam  BP 120/82  Pulse 76  Temp(Src) 98 F (36.7 C) (Oral)  Ht 5' 8.5" (1.74 m)  Wt 232 lb (105.235 kg)  BMI 34.76 kg/m2  General:  Well-developed,well-nourished,in no acute distress; alert,appropriate and cooperative throughout examination  Head:  normocephalic and atraumatic.   Eyes:  vision grossly intact, pupils equal, pupils round, and pupils reactive to light.   Ears:  R ear normal and L ear normal.   Nose:  no external deformity.   Mouth:  good dentition.   Neck:  No deformities, masses, or tenderness noted. Breasts:  No mass, nodules, thickening, tenderness, bulging, retraction, inflamation, nipple discharge or skin changes noted.   Lungs:  Normal respiratory effort, chest expands symmetrically. Lungs are clear to auscultation, no crackles or wheezes. Heart:  Normal rate and regular  rhythm. S1 and S2 normal without gallop, murmur, click, rub or other extra sounds. Abdomen:  Bowel sounds positive,abdomen soft and non-tender without masses, organomegaly or hernias noted. Rectal:  no external abnormalities.   Genitalia:  Pelvic Exam:        External: normal female genitalia without lesions or masses        Vagina: normal without lesions or masses        Cervix: normal without lesions or masses, IUD strings visualized, copious white discharge in vault        Adnexa: normal bimanual exam without masses or fullness        Uterus: normal by palpation        Pap smear: performed Msk:  No deformity or scoliosis noted of thoracic or lumbar spine.   Extremities:  No clubbing, cyanosis, edema, or deformity noted with normal full range of motion of all joints.   Neurologic:  alert & oriented X3 and gait normal.   Skin:  Intact without suspicious lesions or rashes Cervical Nodes:  No lymphadenopathy noted Axillary Nodes:  No palpable lymphadenopathy Psych:  Cognition and judgment appear intact. Alert and cooperative with normal attention span and concentration. No apparent delusions, illusions, hallucinations       Assessment & Plan:   1. ANXIETY  Stable, appears to be coping with stressors well. Meds refilled.   2. Routine general medical examination at a health care facility   Reviewed preventive care protocols, scheduled due services, and updated immunizations Discussed nutrition, exercise, diet, and healthy lifestyle.  Orders Placed This Encounter  Procedures  . MM Digital Screening  Pap   3. Vaginal Discharge  Consistent with yeast infx- treat with Diflucan 150 mg po x 1.

## 2012-03-15 ENCOUNTER — Encounter: Payer: Self-pay | Admitting: *Deleted

## 2012-03-19 ENCOUNTER — Telehealth: Payer: Self-pay

## 2012-03-19 MED ORDER — METRONIDAZOLE 500 MG PO TABS: 500.0000 mg | ORAL_TABLET | Freq: Two times a day (BID) | ORAL | Status: AC

## 2012-03-19 NOTE — Telephone Encounter (Signed)
Pt seen 03/13/12 diflucan did not help discharge. Pt still having discharge and now discharge is yellow. Pt said not having itching but 2 days after seen by Dr Dayton Martes 3 spots came on perineal fold and are still there. Pt cannot tell if blister like or zits or cysts but she thought they would be gone by now. Pt can be reached at [phone] or [phone].Pt uses [facility] if pharmacy needed.

## 2012-03-19 NOTE — Telephone Encounter (Signed)
Advised patient

## 2012-03-19 NOTE — Telephone Encounter (Signed)
She did not have bacteria on wet prep when I saw her but she may be developing it now.  We can try to treat her for bacterial vaginosis with flagyl.  I will send rx to her pharmacy.  Please keep [place] posted with symptoms.

## 2012-03-23 ENCOUNTER — Ambulatory Visit: Payer: 59 | Admitting: *Deleted

## 2012-03-23 ENCOUNTER — Encounter: Payer: 59 | Attending: General Surgery | Admitting: *Deleted

## 2012-03-23 ENCOUNTER — Encounter: Payer: Self-pay | Admitting: *Deleted

## 2012-03-23 ENCOUNTER — Ambulatory Visit: Payer: Self-pay | Admitting: Family Medicine

## 2012-03-23 VITALS — Ht 68.0 in | Wt 233.5 lb

## 2012-03-23 DIAGNOSIS — E669 Obesity, unspecified: Secondary | ICD-10-CM

## 2012-03-23 DIAGNOSIS — Z01818 Encounter for other preprocedural examination: Secondary | ICD-10-CM | POA: Insufficient documentation

## 2012-03-23 DIAGNOSIS — Z713 Dietary counseling and surveillance: Secondary | ICD-10-CM | POA: Insufficient documentation

## 2012-03-23 NOTE — Progress Notes (Signed)
  Pre-Op Assessment Visit:  Pre-Operative LAGB Surgery  Medical Nutrition Therapy:  Appt start time: 0830 end time:  0930.  Patient was seen on 03/23/2012 for Pre-Operative LAGB Nutrition Assessment. Assessment and letter of approval faxed to [facility] Bariatric Surgery Program coordinator on 03/23/2012.  Approval letter and Candace Gallus results sent to [facility] Scan center and will be available in the chart under the media tab.  TANITA  BODY COMP RESULTS   03/23/12     %Fat 47.8%     FM (lbs) 111.5     FFM (lbs) 122.0     TBW (lbs) 89.5     *(Pt has plates in ankle)  Handouts given during visit include:  Pre-Op Goals handout  Samples Dispensed:   Bariatric Advantage Sublingual B12 (2 ea) Lot # [id number];  Exp: 05/13   Celebrate Vitamins B-12 Sublingual (2 ea) Lot # [id number];  Exp: 07/14  Patient to call for Pre-Op and Post-Op Nutrition Education at [facility] when surgery is scheduled.

## 2012-03-23 NOTE — Patient Instructions (Addendum)
   Follow Pre-Op Nutrition Goals to prepare for Lap Band Surgery.   Call the [facility] at [phone] once you have been given your surgery date to enrolled in the Pre-Op Nutrition Class. You will need to attend this nutrition class 3-4 weeks prior to your surgery.  

## 2012-03-26 ENCOUNTER — Ambulatory Visit (HOSPITAL_COMMUNITY)
Admission: RE | Admit: 2012-03-26 | Discharge: 2012-03-26 | Disposition: A | Discharge status: Home / Self Care | Payer: 59 | Source: Ambulatory Visit | Attending: General Surgery | Admitting: General Surgery

## 2012-03-26 ENCOUNTER — Other Ambulatory Visit: Payer: Self-pay

## 2012-03-26 DIAGNOSIS — F3289 Other specified depressive episodes: Secondary | ICD-10-CM | POA: Insufficient documentation

## 2012-03-26 DIAGNOSIS — Z01812 Encounter for preprocedural laboratory examination: Secondary | ICD-10-CM | POA: Insufficient documentation

## 2012-03-26 DIAGNOSIS — F411 Generalized anxiety disorder: Secondary | ICD-10-CM | POA: Insufficient documentation

## 2012-03-26 DIAGNOSIS — Z01818 Encounter for other preprocedural examination: Secondary | ICD-10-CM | POA: Insufficient documentation

## 2012-03-26 DIAGNOSIS — M1710 Unilateral primary osteoarthritis, unspecified knee: Secondary | ICD-10-CM | POA: Insufficient documentation

## 2012-03-26 DIAGNOSIS — Z6834 Body mass index (BMI) 34.0-34.9, adult: Secondary | ICD-10-CM | POA: Insufficient documentation

## 2012-03-26 DIAGNOSIS — F329 Major depressive disorder, single episode, unspecified: Secondary | ICD-10-CM | POA: Insufficient documentation

## 2012-03-27 ENCOUNTER — Encounter: Payer: Self-pay | Admitting: Family Medicine

## 2012-03-27 ENCOUNTER — Encounter: Payer: Self-pay | Admitting: *Deleted

## 2012-03-27 LAB — COMPREHENSIVE METABOLIC PANEL
Albumin/Globulin Ratio: 1.7 (ref 1.1–2.5)
Albumin: 4.2 g/dL (ref 3.5–5.5)
Alkaline Phosphatase: 82 IU/L (ref 25–150)
BUN/Creatinine Ratio: 17 (ref 9–23)
BUN: 12 mg/dL (ref 6–24)
Creatinine, Ser: 0.69 mg/dL (ref 0.57–1.00)
GFR calc Af Amer: 125 mL/min/{1.73_m2} (ref >59)
GFR calc non Af Amer: 108 mL/min/{1.73_m2} (ref >59)
Globulin, Total: 2.5 g/dL (ref 1.5–4.5)
Total Bilirubin: 0.3 mg/dL (ref 0.0–1.2)
Total Protein: 6.7 g/dL (ref 6.0–8.5)

## 2012-03-27 LAB — LIPID PANEL
Chol/HDL Ratio: 3.9 ratio units (ref 0.0–4.4)
HDL: 39 mg/dL — ABNORMAL LOW (ref >39)
Triglycerides: 103 mg/dL (ref 0–149)
VLDL Cholesterol Cal: 21 mg/dL (ref 5–40)

## 2012-03-27 LAB — CBC WITH DIFFERENTIAL/PLATELET
Basophils Absolute: 0.1 10*3/uL (ref 0.0–0.2)
Basos: 1 % (ref 0–3)
Eos: 2 % (ref 0–7)
HCT: 39.9 % (ref 34.0–46.6)
Hemoglobin: 13.3 g/dL (ref 11.1–15.9)
Lymphocytes Absolute: 1.1 10*3/uL (ref 0.7–4.5)
MCHC: 33.3 g/dL (ref 31.5–35.7)
Monocytes: 11 % (ref 4–13)
Neutrophils Absolute: 3.5 10*3/uL (ref 1.8–7.8)
RBC: 4.66 x10E6/uL (ref 3.77–5.28)

## 2012-03-27 LAB — HM MAMMOGRAPHY: HM Mammogram: NORMAL

## 2012-03-27 LAB — T4: T4, Total: 6.6 ug/dL (ref 4.5–12.0)

## 2012-05-01 ENCOUNTER — Other Ambulatory Visit (INDEPENDENT_AMBULATORY_CARE_PROVIDER_SITE_OTHER): Payer: Self-pay | Admitting: General Surgery

## 2012-06-14 ENCOUNTER — Encounter: Payer: Self-pay | Admitting: Family Medicine

## 2012-06-14 ENCOUNTER — Ambulatory Visit (INDEPENDENT_AMBULATORY_CARE_PROVIDER_SITE_OTHER): Payer: 59 | Admitting: Family Medicine

## 2012-06-14 VITALS — BP 110/80 | HR 92 | Temp 97.8°F | Wt 230.0 lb

## 2012-06-14 DIAGNOSIS — E669 Obesity, unspecified: Secondary | ICD-10-CM

## 2012-06-14 MED ORDER — PHENTERMINE HCL 37.5 MG PO CAPS: 37.5000 mg | ORAL_CAPSULE | ORAL | Status: DC

## 2012-06-14 MED ORDER — BUSPIRONE HCL 30 MG PO TABS: ORAL_TABLET | ORAL | Status: DC

## 2012-06-14 NOTE — Patient Instructions (Addendum)
Great to see you. Let's try phentermine. Please come back to see me in one month.

## 2012-06-14 NOTE — Progress Notes (Signed)
41 yo here to discuss obestiy.  Obesity-  Wt Readings from Last 3 Encounters:  06/14/12 230 lb (104.327 kg)  03/23/12 233 lb 8 oz (105.915 kg)  03/13/12 232 lb (105.235 kg)    Has tried several diet- even went to the Bariatric clinic in [place] for awhile.   Was going to the gym, hired  Systems analyst. We tried phentermine earlier this summer. She feels like nothing is working. Referred to [facility] for bariatric surgery- insurance approval still pending.  Wants to consider other options.  Patient Active Problem List  Diagnosis  . ANXIETY  . ADJ DISORDER WITH MIXED ANXIETY & DEPRESSED MOOD  . DEPRESSION  . VAGINAL DISCHARGE  . OTHER ACUTE SINUSITIS  . Obesity  . Routine general medical examination at a health care facility  . Vaginal Discharge   Past Medical History  Diagnosis Date  . Anxiety   . Depression   . Anxiety    Past Surgical History  Procedure Date  . Fracture surgery   . Orif ankle fracture 11/10/2011    Procedure: OPEN REDUCTION INTERNAL FIXATION (ORIF) ANKLE FRACTURE;  Surgeon: Loanne Drilling;  Location: [facility];  Service: Orthopedics;  Laterality: Right;  with screw applicaton  . I&d extremity 11/10/2011    Procedure: IRRIGATION AND DEBRIDEMENT EXTREMITY;  Surgeon: Loanne Drilling;  Location: [facility] OR;  Service: Orthopedics;  Laterality: Right;   History  Substance Use Topics  . Smoking status: Never Smoker   . Smokeless tobacco: Not on file  . Alcohol Use: 1.2 oz/week    2 Glasses of wine per week     month   Family History  Problem Relation Age of Onset  . Cancer Daughter     osteosarcoma of leg  . Cancer Paternal Grandfather     colon   No Known Allergies Current Outpatient Prescriptions on File Prior to Visit  Medication Sig Dispense Refill  . clonazePAM (KLONOPIN) 0.5 MG tablet Take 1 tablet (0.5 mg total) by mouth 3 (three) times daily as needed. Take as needed for anxiety  90 tablet  0  . escitalopram (LEXAPRO) 20 MG tablet Take 1 tablet  (20 mg total) by mouth daily. Take 1 tablet every night.  90 tablet  3  . levonorgestrel (MIRENA) 20 MCG/24HR IUD 1 each by Intrauterine route once.      . phentermine 37.5 MG capsule Take 1 capsule (37.5 mg total) by mouth every morning.  30 capsule  0     The PMH, PSH, Social History, Family History, Medications, and allergies have been reviewed in [facility], and have been updated if relevant.   Review of Systems       See HPI   Physical Exam BP 110/80  Pulse 92  Temp 97.8 F (36.6 C)  Wt 230 lb (104.327 kg)  General:  alert and well-developed.   Head:  normocephalic and atraumatic.   Eyes:  vision grossly intact, pupils equal, and pupils round.   Ears:  R ear normal and L ear normal.   Nose:  no external deformity.   Mouth:  good dentition.   Neck:  No deformities, masses, or tenderness noted. Lungs:  Normal respiratory effort, chest expands symmetrically. Lungs are clear to auscultation, no crackles or wheezes. Heart:  Normal rate and regular rhythm. S1 and S2 normal without gallop, murmur, click, rub or other extra sounds. Neurologic:  alert & oriented X3 and gait normal.   Skin:  Intact without suspicious lesions or rashes Psych:  normally interactive and good eye contact.   tearful when talking about her daughter.   Assessment and Plan: 1. Obesity   Deteriorated. >15 min spent with face to face with patient, >50% counseling and/or coordinating care. We discussed Belviq vs another trial of phentermine at higher dose. She is aware of risks of phentermine including HTN, stroke, Pulmonary HTN. She would like to try phentermine at higher dose. Follow up in one month

## 2012-08-22 ENCOUNTER — Encounter: Payer: Self-pay | Admitting: Family Medicine

## 2012-08-22 ENCOUNTER — Ambulatory Visit (INDEPENDENT_AMBULATORY_CARE_PROVIDER_SITE_OTHER): Payer: 59 | Admitting: Family Medicine

## 2012-08-22 VITALS — BP 126/84 | HR 92 | Temp 98.8°F | Ht 68.5 in | Wt 233.0 lb

## 2012-08-22 DIAGNOSIS — IMO0002 Reserved for concepts with insufficient information to code with codable children: Secondary | ICD-10-CM

## 2012-08-22 DIAGNOSIS — S0181XA Laceration without foreign body of other part of head, initial encounter: Secondary | ICD-10-CM

## 2012-08-22 DIAGNOSIS — S0180XA Unspecified open wound of other part of head, initial encounter: Secondary | ICD-10-CM

## 2012-08-22 DIAGNOSIS — Z23 Encounter for immunization: Secondary | ICD-10-CM

## 2012-08-22 DIAGNOSIS — T148XXA Other injury of unspecified body region, initial encounter: Secondary | ICD-10-CM
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
## 2012-08-22 NOTE — Patient Instructions (Addendum)
Keep the area covered and dry for 24 hours.  You can wash it gently after 24 hours with soap and water.  Don't use peroxide.  If you have more bleeding (that doesn't resolve with pressure for 10 minutes), spreading redness, or pus draining, then notify us.  Come back in 7 days for suture removal.

## 2012-08-28 ENCOUNTER — Ambulatory Visit (INDEPENDENT_AMBULATORY_CARE_PROVIDER_SITE_OTHER): Payer: 59 | Admitting: Family Medicine

## 2012-08-28 ENCOUNTER — Encounter: Payer: Self-pay | Admitting: Family Medicine

## 2012-08-28 VITALS — BP 122/84 | HR 76 | Temp 98.4°F | Ht 68.5 in | Wt 233.0 lb

## 2012-08-28 DIAGNOSIS — S0180XA Unspecified open wound of other part of head, initial encounter: Secondary | ICD-10-CM

## 2012-08-28 DIAGNOSIS — S0181XA Laceration without foreign body of other part of head, initial encounter: Secondary | ICD-10-CM

## 2012-08-28 DIAGNOSIS — H119 Unspecified disorder of conjunctiva: Secondary | ICD-10-CM

## 2012-08-28 NOTE — Patient Instructions (Addendum)
Let us know if your eye doesn't improve gradually.  Trim the steri strips as they roll up.  Take care. No charge for visit.

## 2012-08-29 ENCOUNTER — Encounter: Payer: Self-pay | Admitting: Family Medicine

## 2012-08-29 ENCOUNTER — Ambulatory Visit: Payer: 59 | Admitting: Family Medicine

## 2012-08-29 DIAGNOSIS — H119 Unspecified disorder of conjunctiva: Secondary | ICD-10-CM | POA: Insufficient documentation

## 2012-08-29 NOTE — Assessment & Plan Note (Signed)
Covered with benzoin and steristrips.  Looks good. Routine instructions given.

## 2012-08-29 NOTE — Assessment & Plan Note (Signed)
Improved per patient, no abnormality other than injection.  No tx at this point.  20/20 B. Fu prn.

## 2012-08-29 NOTE — Progress Notes (Signed)
Suture removal on L side of forehead.  Tolerated well in meantime.  No FCNAV.  No wound complications.   Per patient, has talked with police and is uncertain about pressing charges.  Safe at home.    Additionally R eye with incidental findings over the last few days.  No FCNAVD but her R eye was injected and sore w/o vision change.  Soreness is improved.  Never had eye discharge.  No FB sensation or FB known. No sx on L eye. No ear/nasal/oral sx.   Meds, vitals, and allergies reviewed.   ROS: See HPI.  Otherwise, noncontributory.  nad 6 sutures easily removed with good tissue healing, no open wound.  No erythema.  Tolerated well PERRL, EOMI.  R eye injected, L wnl No FB and no defect seen on staining/wood's lamp

## 2012-09-21 ENCOUNTER — Other Ambulatory Visit: Payer: Self-pay | Admitting: Family Medicine

## 2012-09-21 MED ORDER — CLONAZEPAM 0.5 MG PO TABS: 0.5000 mg | ORAL_TABLET | Freq: Three times a day (TID) | ORAL | Status: DC | PRN

## 2012-09-21 NOTE — Telephone Encounter (Signed)
If at all possible pt needs her Klonopin refilled before the weekend. Pt is tearful. Please give her a call back. Thank you

## 2012-09-21 NOTE — Telephone Encounter (Signed)
Medicine called to [facility].   

## 2012-09-21 NOTE — Telephone Encounter (Signed)
Pt is under a lot of stress, with her daughter's illness, and she is asking that a refill on klonopin be sent to [facility].  Needs today.

## 2012-10-02 ENCOUNTER — Encounter: Payer: Self-pay | Admitting: Family Medicine

## 2012-10-02 ENCOUNTER — Other Ambulatory Visit: Payer: Self-pay | Admitting: Family Medicine

## 2012-10-02 ENCOUNTER — Ambulatory Visit (INDEPENDENT_AMBULATORY_CARE_PROVIDER_SITE_OTHER): Payer: 59 | Admitting: Family Medicine

## 2012-10-02 VITALS — BP 110/80 | HR 84 | Temp 98.0°F | Wt 235.0 lb

## 2012-10-02 DIAGNOSIS — N766 Ulceration of vulva: Secondary | ICD-10-CM

## 2012-10-02 MED ORDER — VALACYCLOVIR HCL 1 G PO TABS: 1000.0000 mg | ORAL_TABLET | Freq: Two times a day (BID) | ORAL | Status: DC

## 2012-10-02 NOTE — Addendum Note (Signed)
Addended by: Alvina Chou on: 10/02/2012 02:54 PM   Modules accepted: Orders

## 2012-10-02 NOTE — Addendum Note (Signed)
Addended by: Alvina Chou on: 10/02/2012 11:52 AM   Modules accepted: Orders

## 2012-10-02 NOTE — Progress Notes (Signed)
Subjective:    Patient ID: Crystal Houston, female    DOB: Feb 02, 1971, 41 y.o.   MRN: [id number]  HPI  41 yo with 3 days of painful ulcer on her left labia.  Has not been sexually active in over 8 months.  No vaginal discharge.  No h/o herpes.  No fevers, chills or malaise.  Never had anything like this before.  She has been under significant stress lately.  Patient Active Problem List  Diagnosis  . ANXIETY  . ADJ DISORDER WITH MIXED ANXIETY & DEPRESSED MOOD  . DEPRESSION  . VAGINAL DISCHARGE  . OTHER ACUTE SINUSITIS  . Obesity  . Routine general medical examination at a health care facility  . Vaginal Discharge  . Laceration of face  . Conjunctiva disorder   Past Medical History  Diagnosis Date  . Anxiety   . Depression   . Anxiety    Past Surgical History  Procedure Date  . Fracture surgery   . Orif ankle fracture 11/10/2011    Procedure: OPEN REDUCTION INTERNAL FIXATION (ORIF) ANKLE FRACTURE;  Surgeon: Loanne Drilling;  Location: [facility] OR;  Service: Orthopedics;  Laterality: Right;  with screw applicaton  . I&d extremity 11/10/2011    Procedure: IRRIGATION AND DEBRIDEMENT EXTREMITY;  Surgeon: Loanne Drilling;  Location: [facility] OR;  Service: Orthopedics;  Laterality: Right;   History  Substance Use Topics  . Smoking status: Never Smoker   . Smokeless tobacco: Never Used  . Alcohol Use: 1.2 oz/week    2 Glasses of wine per week     month   Family History  Problem Relation Age of Onset  . Cancer Daughter     osteosarcoma of leg  . Cancer Paternal Grandfather     colon   No Known Allergies Current Outpatient Prescriptions on File Prior to Visit  Medication Sig Dispense Refill  . busPIRone (BUSPAR) 30 MG tablet 1 tablet by mouth twice daily  180 tablet  3  . clonazePAM (KLONOPIN) 0.5 MG tablet Take 1 tablet (0.5 mg total) by mouth 3 (three) times daily as needed. Take as needed for anxiety  90 tablet  0  . escitalopram (LEXAPRO) 20 MG tablet Take 1 tablet (20  mg total) by mouth daily. Take 1 tablet every night.  90 tablet  3  . ibuprofen (ADVIL,MOTRIN) 200 MG tablet Take 200 mg by mouth every 6 (six) hours as needed.      Marland Kitchen levonorgestrel (MIRENA) 20 MCG/24HR IUD 1 each by Intrauterine route once.       The PMH, PSH, Social History, Family History, Medications, and allergies have been reviewed in [facility], and have been updated if relevant.   Review of Systems See HPI    Objective:   Physical Exam BP 110/80  Pulse 84  Temp 98 F (36.7 C)  Wt 235 lb (106.595 kg) Gen:  Alert, pleasant, NAD  General:  Well-developed,well-nourished,in no acute distress; alert,appropriate and cooperative throughout examination Genitalia:  Pelvic Exam:        Small shallow ulcer on left labia majora (inner aspect), otherwise unremarkable Inguinal Nodes:  No palpable lymphadenopathy Psych:  Cognition and judgment appear intact. Alert and cooperative with normal attention span and concentration. No apparent delusions, illusions, hallucinations       Assessment & Plan:  1.  Labial ulcer-  New.  Has not been sexually active in 8 months.  Likely HSV, first outbreak . Will order lab work today. Start on Valtrex.  Discussed HSV, course and  treatment.

## 2012-10-02 NOTE — Patient Instructions (Signed)
Good to see you. We will call you with your lab results.  Please take Valtrex as directed- 1 tablet twice daily x 10 days.

## 2012-10-09 LAB — HSV 1 AND 2 IGM ABS, INDIRECT: HSV 1 IgM: 1:100 {titer} — ABNORMAL HIGH

## 2012-11-27 IMAGING — RF DG ANKLE COMPLETE 3+V*R*
1 series · 4 of 4 positions shown · non-contrast
Comparison: 11/10/2011

CLINICAL DATA: ORIF right ankle fracture.

RIGHT ANKLE - COMPLETE 3+ VIEW

[Series 1: run · 4 of 4 slices shown]
[im 1/4]
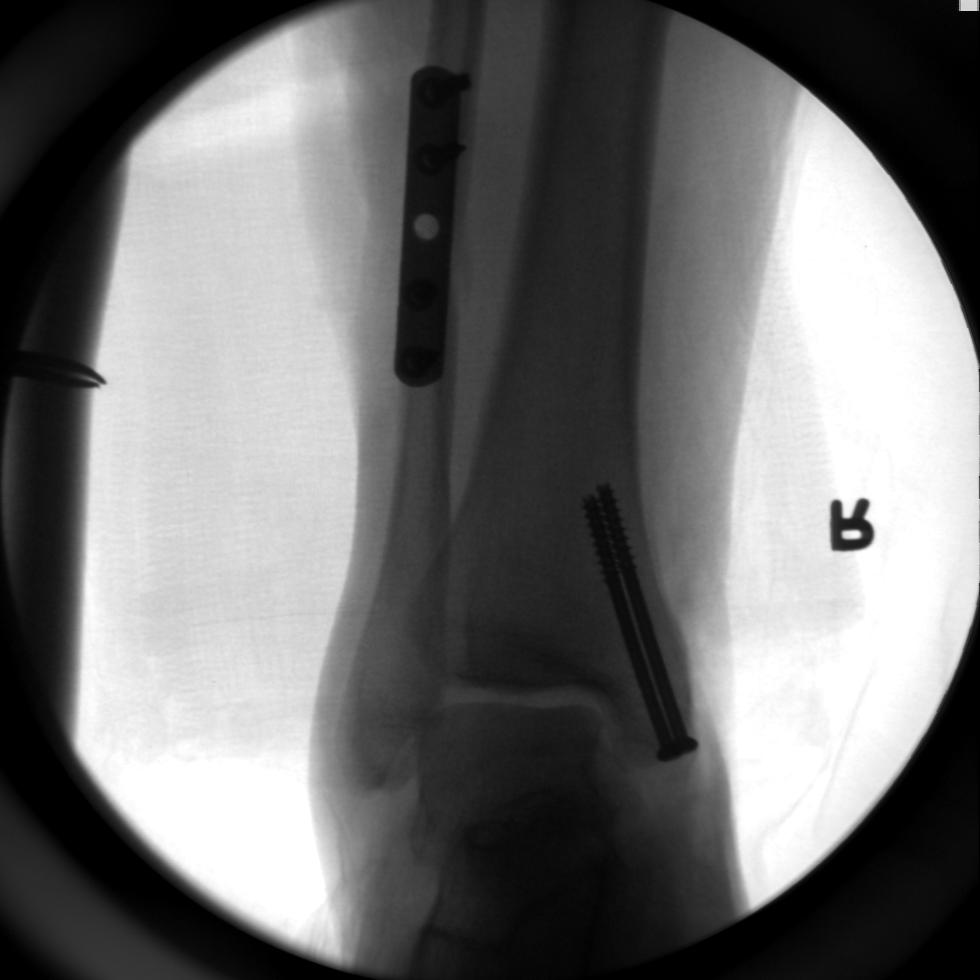
[im 2/4]
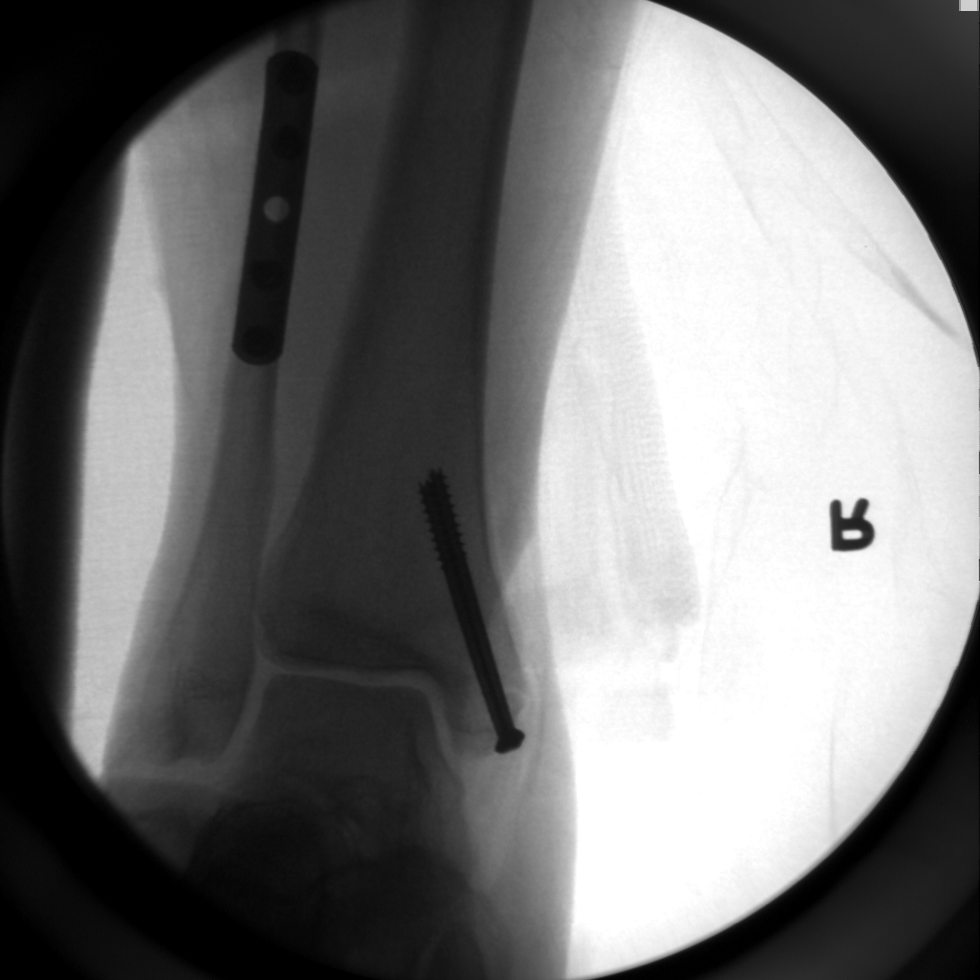
[im 3/4]
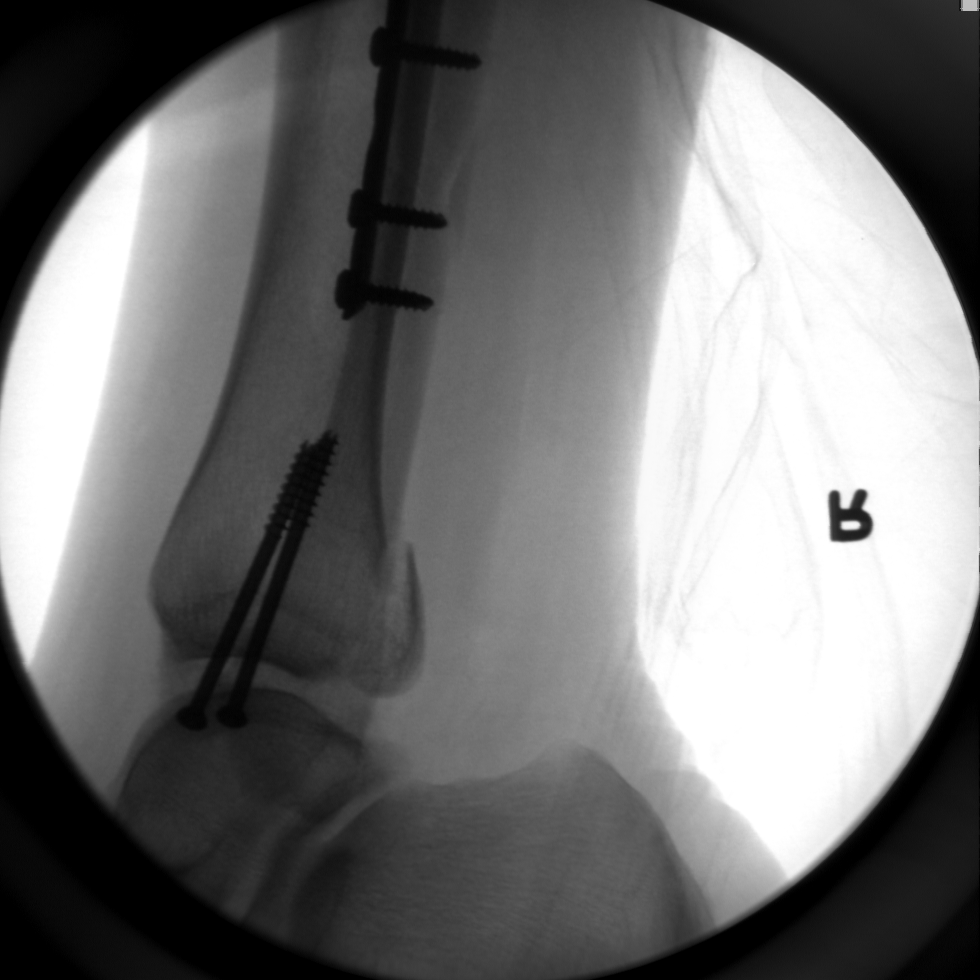
[im 4/4]
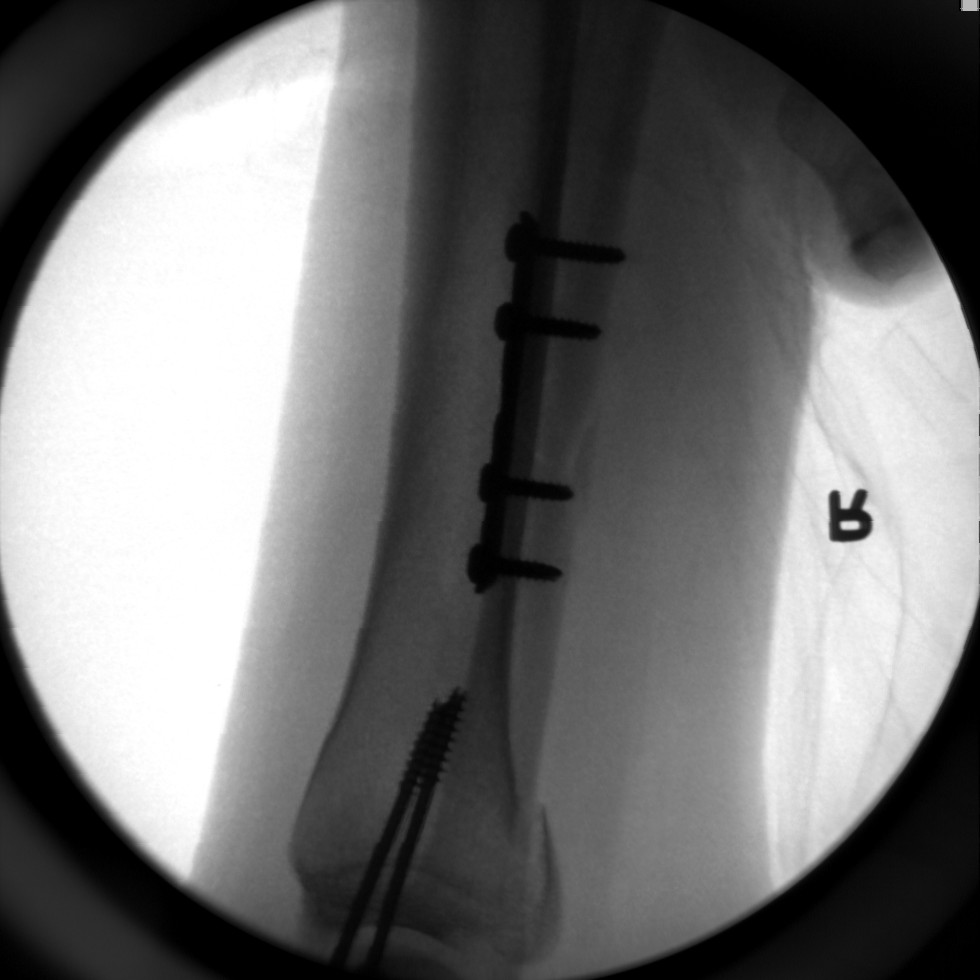

[4 of 4 positions shown; findings below may reference images not displayed]

FINDINGS: Internal fixation with plate screw fixation across the
distal fibular fracture and two screws in the medial malleolus.
Interval reduction of the dislocation.  The posterior malleolar
fragment remains mildly displaced on the lateral view.
IMPRESSION: Interval reduction and fixation as above.

## 2013-04-12 IMAGING — RF DG UGI W/ KUB
15 of 17 series · 15 of 17 positions shown · non-contrast
Comparison: Abdominal ultrasound 03/26/2012

CLINICAL DATA: Morbid obesity.  Preop for bariatric surgery.

UPPER GI SERIES WITH KUB
TECHNIQUE: Routine upper GI series was performed with barium.
Fluoroscopy Time: 1.6 minutes

[Series 1: run · 1 of 1 slices shown (1 of 13)]
[im 1/1]
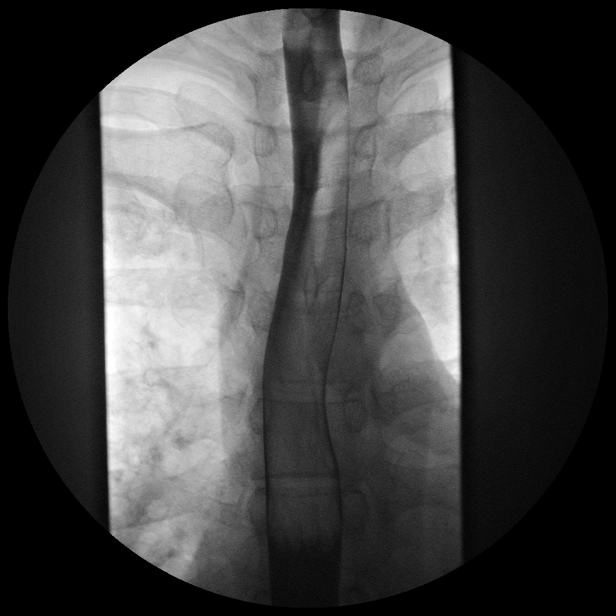

[Series 2: run · 1 of 1 slices shown (2 of 13)]
[im 1/1]
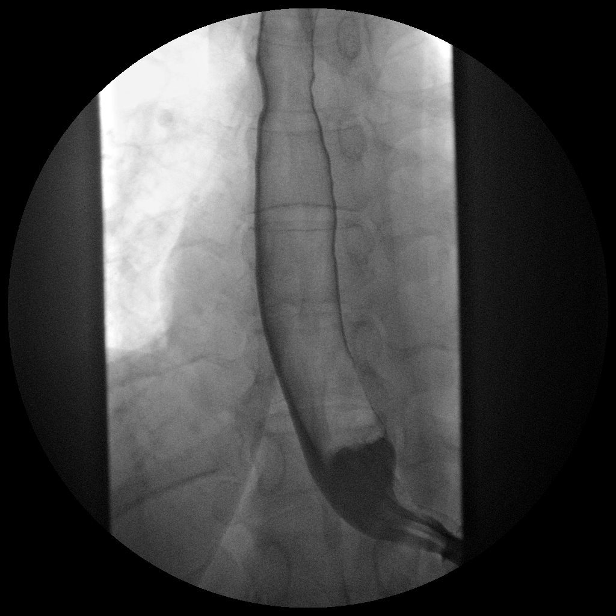

[Series 3: run · 1 of 1 slices shown (3 of 13)]
[im 1/1]
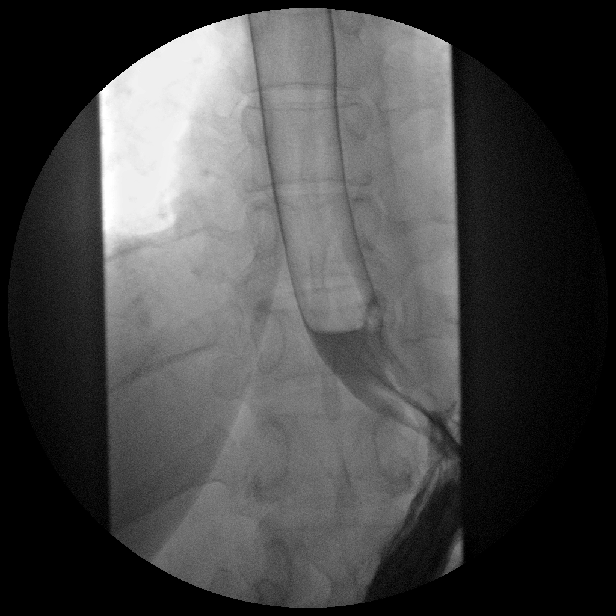

[Series 4: run · 1 of 1 slices shown (4 of 13)]
[im 1/1]
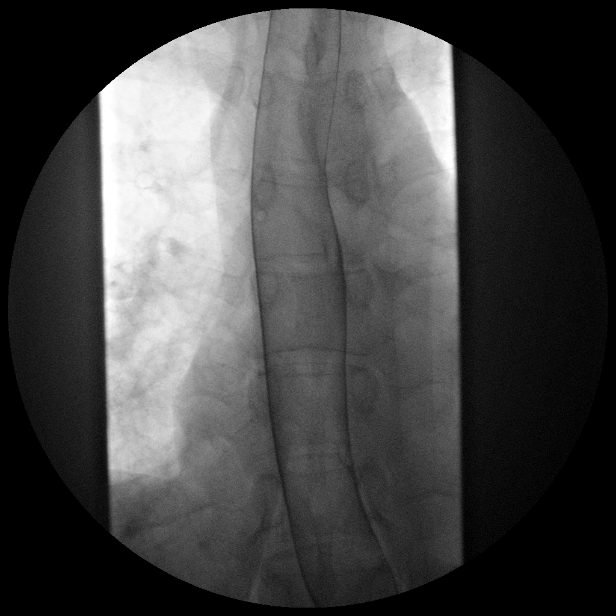

[Series 6: run · 1 of 1 slices shown (5 of 13)]
[im 1/1]
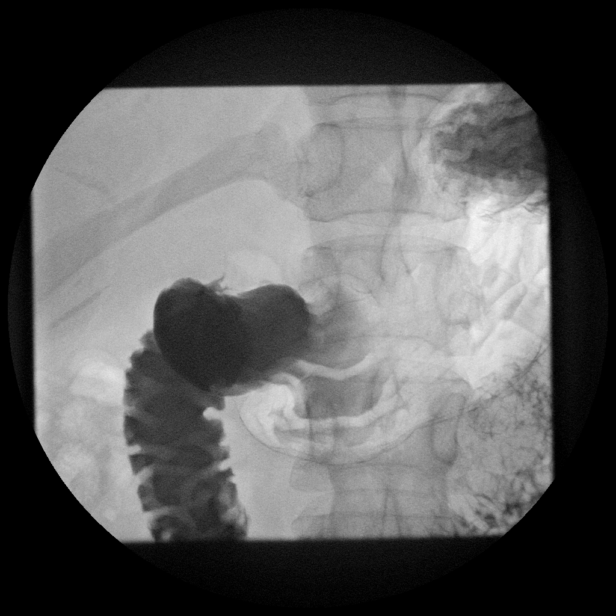

[Series 7: run · 1 of 1 slices shown (6 of 13)]
[im 1/1]
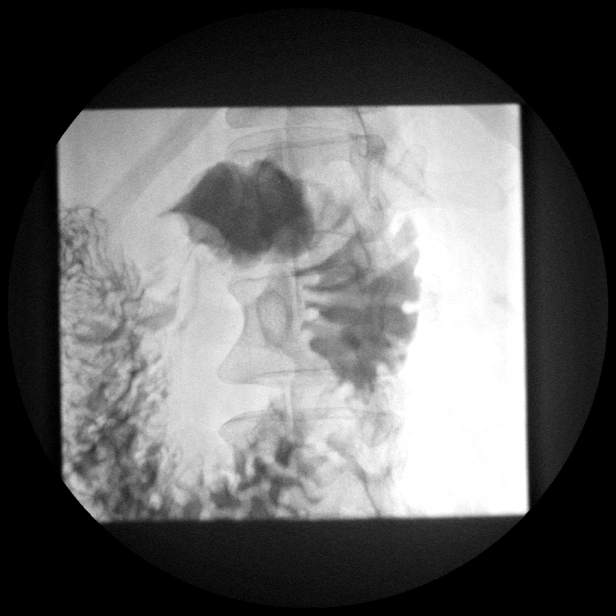

[Series 8: run · 1 of 1 slices shown (7 of 13)]
[im 1/1]
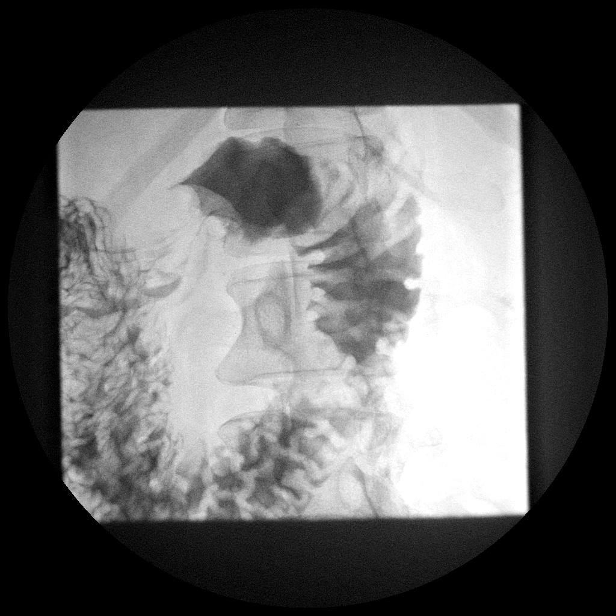

[Series 9: run · 1 of 1 slices shown (8 of 13)]
[im 1/1]
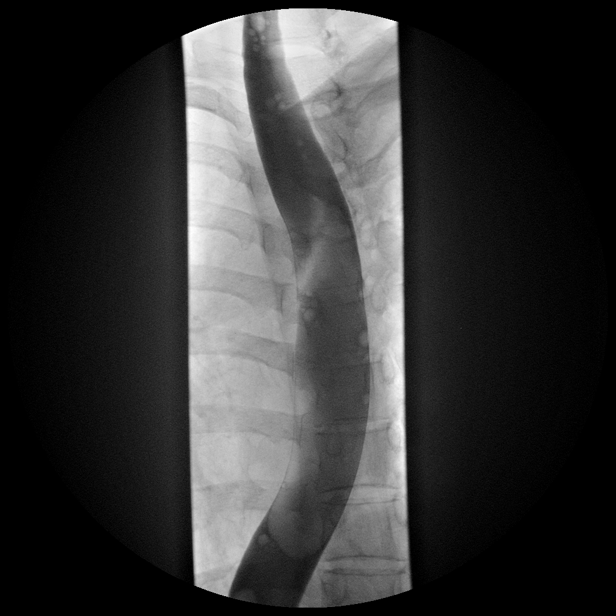

[Series 10: run · 1 of 1 slices shown (9 of 13)]
[im 1/1]
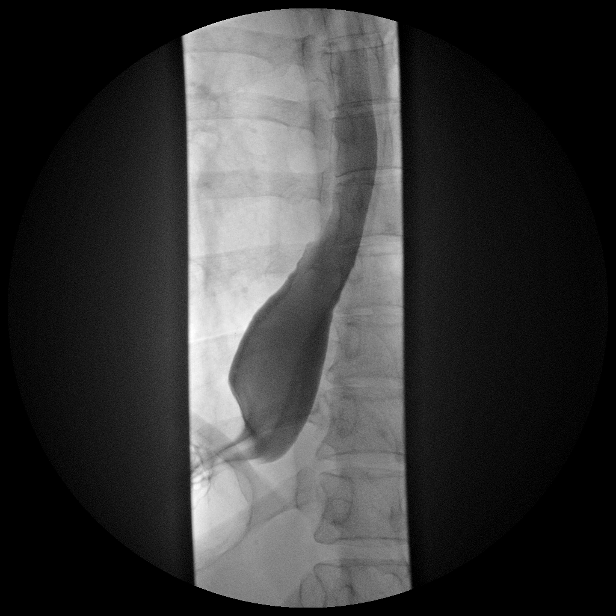

[Series 11: run · 1 of 1 slices shown (10 of 13)]
[im 1/1]
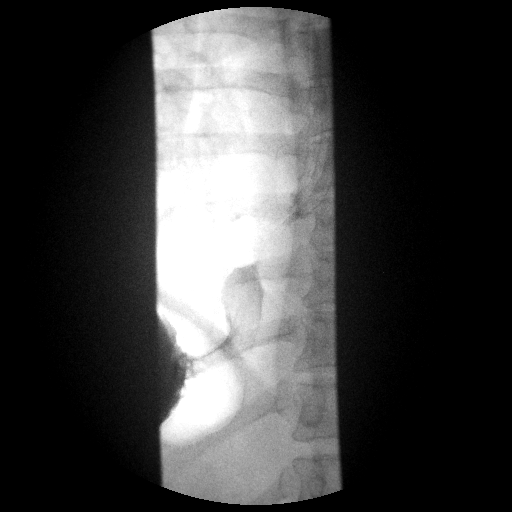

[Series 12: run · 1 of 1 slices shown (11 of 13)]
[im 1/1]
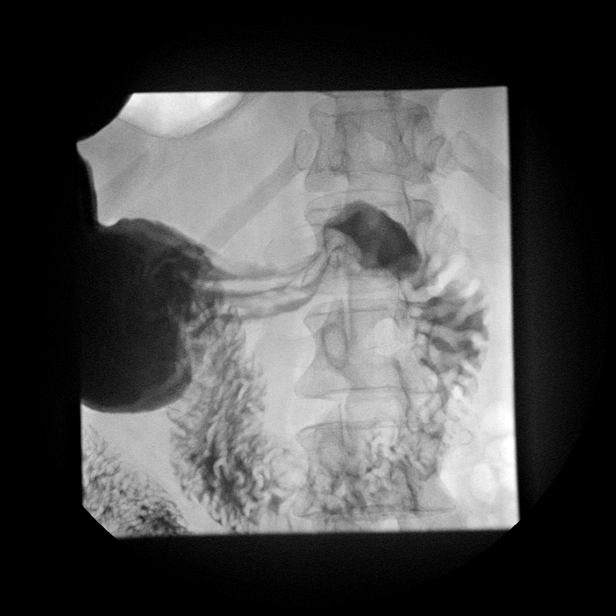

[Series 14: run · 1 of 1 slices shown (12 of 13)]
[im 1/1]
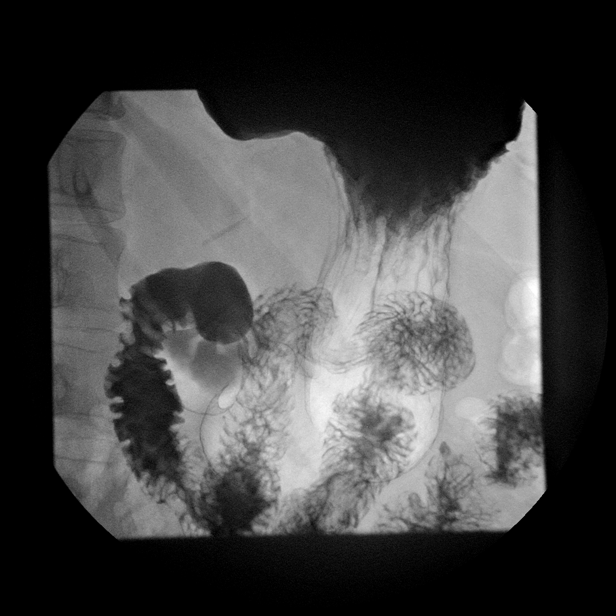

[Series 15: run · 1 of 1 slices shown (13 of 13)]
[im 1/1]
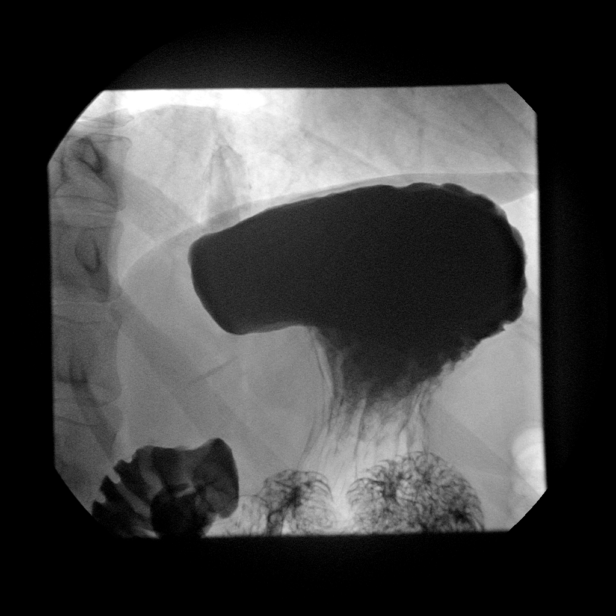

[Series 1001: view not recorded · 0.20mm/px · 1 of 1 slices shown (1 of 2)]
[im 1/1]
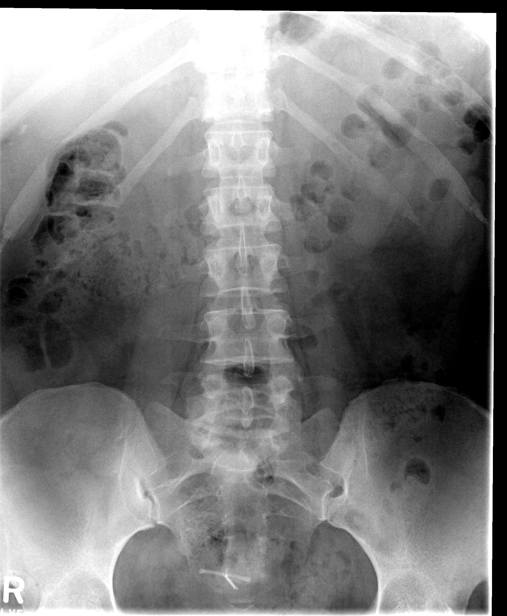

[Series 1002: view not recorded · 0.20mm/px · 1 of 1 slices shown (2 of 2)]
[im 1/1]
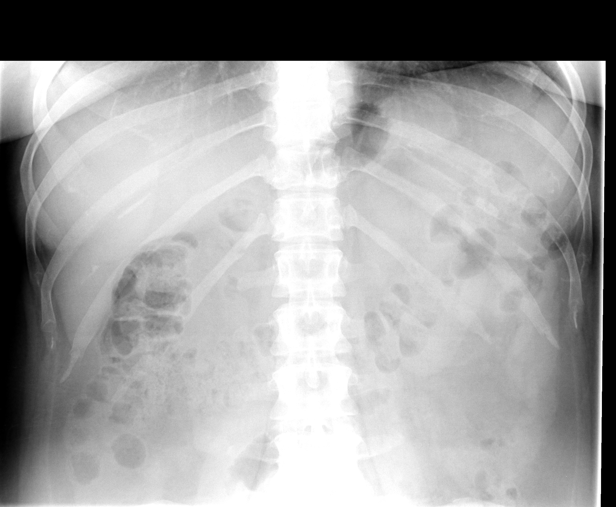

[15 of 17 positions shown; findings below may reference images not displayed]

FINDINGS: Scout view of the abdomen shows a nonobstructive bowel
gas pattern.  Intrauterine device projects over the midline pelvis.

The esophagus demonstrates normal peristalsis and contour.  No
esophageal stricture or focal filling defect identified.

The stomach is normally positioned.  Gastric rugae have normal
appearances.  The duodenal bulb and C-loop have normal appearances
on single contrast exam.

No gastroesophageal reflux is identified.
IMPRESSION: Single contrast upper GI examination is within normal limits.

## 2013-04-30 ENCOUNTER — Encounter: Payer: Self-pay | Admitting: Radiology

## 2013-05-01 ENCOUNTER — Encounter: Payer: Self-pay | Admitting: Family Medicine

## 2013-05-01 ENCOUNTER — Ambulatory Visit (INDEPENDENT_AMBULATORY_CARE_PROVIDER_SITE_OTHER): Payer: 59 | Admitting: Family Medicine

## 2013-05-01 VITALS — BP 120/70 | HR 80 | Temp 98.1°F | Wt 242.0 lb

## 2013-05-01 DIAGNOSIS — J069 Acute upper respiratory infection, unspecified: Secondary | ICD-10-CM

## 2013-05-01 MED ORDER — HYDROCOD POLST-CHLORPHEN POLST 10-8 MG/5ML PO LQCR: 5.0000 mL | Freq: Two times a day (BID) | ORAL | Status: DC | PRN

## 2013-05-01 MED ORDER — AZITHROMYCIN 250 MG PO TABS: ORAL_TABLET | ORAL | Status: DC

## 2013-05-01 NOTE — Progress Notes (Signed)
SUBJECTIVE:  Crystal Houston is a 42 y.o. female who complains of coryza, congestion, sneezing and productive cough for 14 days. She denies a history of anorexia, chest pain and fevers and denies a history of asthma. Patient denies smoke cigarettes.  Unfortunately her daughter passed away last month so she has not been able to take care of herself like she usually does.  Has a great support system and feels like she is coping ok.  She is relieved that she is no longer suffering.   Patient Active Problem List   Diagnosis Date Noted  . Obesity 05/03/2011  . ANXIETY 12/21/2010  . ADJ DISORDER WITH MIXED ANXIETY & DEPRESSED MOOD 12/21/2010  . DEPRESSION 12/21/2010   Past Medical History  Diagnosis Date  . Anxiety   . Depression   . Anxiety    Past Surgical History  Procedure Laterality Date  . Fracture surgery    . Orif ankle fracture  11/10/2011    Procedure: OPEN REDUCTION INTERNAL FIXATION (ORIF) ANKLE FRACTURE;  Surgeon: Loanne Drilling;  Location: [facility];  Service: Orthopedics;  Laterality: Right;  with screw applicaton  . I&d extremity  11/10/2011    Procedure: IRRIGATION AND DEBRIDEMENT EXTREMITY;  Surgeon: Loanne Drilling;  Location: [facility];  Service: Orthopedics;  Laterality: Right;   History  Substance Use Topics  . Smoking status: Never Smoker   . Smokeless tobacco: Never Used  . Alcohol Use: 1.2 oz/week    2 Glasses of wine per week     Comment: month   Family History  Problem Relation Age of Onset  . Cancer Daughter     osteosarcoma of leg  . Cancer Paternal Grandfather     colon   No Known Allergies Current Outpatient Prescriptions on File Prior to Visit  Medication Sig Dispense Refill  . busPIRone (BUSPAR) 30 MG tablet 1 tablet by mouth twice daily  180 tablet  3  . clonazePAM (KLONOPIN) 0.5 MG tablet Take 1 tablet (0.5 mg total) by mouth 3 (three) times daily as needed. Take as needed for anxiety  90 tablet  0  . escitalopram (LEXAPRO) 20 MG tablet Take 1  tablet (20 mg total) by mouth daily. Take 1 tablet every night.  90 tablet  3  . ibuprofen (ADVIL,MOTRIN) 200 MG tablet Take 200 mg by mouth every 6 (six) hours as needed.      Marland Kitchen levonorgestrel (MIRENA) 20 MCG/24HR IUD 1 each by Intrauterine route once.       No current facility-administered medications on file prior to visit.   The PMH, PSH, Social History, Family History, Medications, and allergies have been reviewed in [facility], and have been updated if relevant.  OBJECTIVE: BP 120/70  Pulse 80  Temp(Src) 98.1 F (36.7 C)  Wt 242 lb (109.77 kg)  BMI 36.26 kg/m2  She appears well, vital signs are as noted. Ears normal.  Throat and pharynx normal.  Neck supple. No adenopathy in the neck. Nose is congested. Sinuses non tender.  Right lower lobe wheezing, no crackles, no increased WOB  ASSESSMENT:  bronchitis  PLAN: Given duration and progression of symptoms, will treat for bacterial process with zpack. Symptomatic therapy suggested: push fluids, rest and return office visit prn if symptoms persist or worsen.Call or return to clinic prn if these symptoms worsen or fail to improve as anticipated.

## 2013-05-01 NOTE — Patient Instructions (Addendum)
Good to see you. I am so sorry for your loss.  I hope you have a safe trip.  Take Zpack as directed. Tussionex as needed for cough.

## 2013-05-17 ENCOUNTER — Encounter: Payer: Self-pay | Admitting: Family Medicine

## 2013-05-17 ENCOUNTER — Ambulatory Visit (INDEPENDENT_AMBULATORY_CARE_PROVIDER_SITE_OTHER): Payer: 59 | Admitting: Family Medicine

## 2013-05-17 ENCOUNTER — Telehealth: Payer: Self-pay | Admitting: Family Medicine

## 2013-05-17 VITALS — BP 118/70 | HR 88 | Temp 98.2°F | Wt 240.0 lb

## 2013-05-17 DIAGNOSIS — J209 Acute bronchitis, unspecified: Secondary | ICD-10-CM | POA: Insufficient documentation

## 2013-05-17 MED ORDER — GUAIFENESIN-CODEINE 100-10 MG/5ML PO SYRP: 5.0000 mL | ORAL_SOLUTION | Freq: Two times a day (BID) | ORAL | Status: DC | PRN

## 2013-05-17 NOTE — Telephone Encounter (Signed)
Will see today.  

## 2013-05-17 NOTE — Assessment & Plan Note (Signed)
Anticipate post infectious cough from recent bronchitis treated with zpack. Continued supportive care If not improving as expected in 1 wk, return for xray (would be 5 wks from initial illness).  If worsening cough or fever, come in sooner for f/u or xray.  Pt agrees with plan. cheratussin for cough -tussionex too strong.

## 2013-05-17 NOTE — Patient Instructions (Signed)
I think you have residual cough from bronchitis. Treat with cheratussin codeine cough syrup. Push fluids and rest. If going into next Friday with no improvement, call us for xray. If fever >101, or worsening productive cough, call us for xray or follow up.

## 2013-05-17 NOTE — Progress Notes (Signed)
  Subjective:    Patient ID: Crystal Houston, female    DOB: 06-23-1971, 42 y.o.   MRN: [id number]  HPI CC: recheck bronchitis.  Saw PCP on 05/01/2013 after 2 wk course of cough and congestion, dx bronchitis, treated with zpack and tussionex which helped cough.  Went to trip to [place].  Slowly improving.  Cough never completely resolved - seems to be getting worse over the last week.  Trouble with deep breaths 2/2 coughing fits.  Some wheezing heard on PCP's exam.  Main concern is persistent cough.  Overall feeling better otherwise.  Cough productive of clear mucous.  Pt restarted zyrtec.  No fevers/chills, abd pain, ear or tooth pain, HA.  No smokers at home. No h/o asthma or COPD.  No sick contacts at home.  Past Medical History  Diagnosis Date  . Anxiety   . Depression   . Anxiety      Review of Systems Per HPI    Objective:   Physical Exam  Nursing note and vitals reviewed. Constitutional: She appears well-developed and well-nourished. No distress.  HENT:  Head: Normocephalic and atraumatic.  Right Ear: Hearing, tympanic membrane, external ear and ear canal normal.  Left Ear: Hearing, tympanic membrane, external ear and ear canal normal.  Nose: No mucosal edema or rhinorrhea. Right sinus exhibits no maxillary sinus tenderness and no frontal sinus tenderness. Left sinus exhibits no maxillary sinus tenderness and no frontal sinus tenderness.  Mouth/Throat: Uvula is midline, oropharynx is clear and moist and mucous membranes are normal. No oropharyngeal exudate, posterior oropharyngeal edema, posterior oropharyngeal erythema or tonsillar abscesses.  Eyes: Conjunctivae and EOM are normal. Pupils are equal, round, and reactive to light. No scleral icterus.  Neck: Normal range of motion. Neck supple.  Cardiovascular: Normal rate, regular rhythm, normal heart sounds and intact distal pulses.   No murmur heard. Pulmonary/Chest: Effort normal. No respiratory distress. She has no  wheezes. She has no rhonchi. She has rales (RLL).  Lymphadenopathy:    She has no cervical adenopathy.  Skin: Skin is warm and dry. No rash noted.       Assessment & Plan:

## 2013-05-17 NOTE — Telephone Encounter (Signed)
Pt calling regarding Bronchitis f/u from 05/01/13. Pt took all of the Zpack Rx prescribed. Also taking Rx cough med that does give her some relief but cough is getting worse and not better. Afebrile. Emergent sx r/o per Cough protocol. See today in office for Cough is worse. Appt made for 05/17/13. 1530 with Dr. Reece Agar.

## 2013-05-20 ENCOUNTER — Telehealth: Payer: Self-pay | Admitting: Family Medicine

## 2013-05-20 ENCOUNTER — Encounter: Payer: Self-pay | Admitting: Family Medicine

## 2013-05-20 ENCOUNTER — Ambulatory Visit (INDEPENDENT_AMBULATORY_CARE_PROVIDER_SITE_OTHER): Payer: 59 | Admitting: Family Medicine

## 2013-05-20 VITALS — BP 104/74 | HR 76 | Temp 98.2°F | Wt 239.5 lb

## 2013-05-20 DIAGNOSIS — J019 Acute sinusitis, unspecified: Secondary | ICD-10-CM

## 2013-05-20 MED ORDER — AMOXICILLIN-POT CLAVULANATE 875-125 MG PO TABS: 1.0000 | ORAL_TABLET | Freq: Two times a day (BID) | ORAL | Status: DC

## 2013-05-20 NOTE — Patient Instructions (Addendum)
Start the antibiotics today.  Get some rest and update [place] later in the week.  Take care.

## 2013-05-20 NOTE — Telephone Encounter (Signed)
Will see today.  

## 2013-05-20 NOTE — Telephone Encounter (Signed)
Patient Information:  Caller Name: [name]  Phone: [phone]  Patient: Crystal, Houston  Gender: Female  DOB: 1971-09-24  Age: 42 Years  PCP: Ruthe Mannan [facility])  Pregnant: No  Office Follow Up:  Does the office need to follow up with this patient?: No  Instructions For The Office: N/A  RN Note:  Mirena/IUD. Calling from work.  Frontal/facial headache pain rated mild but constant.  Symptoms  Reason For Call & Symptoms: Seen 05/17/13 for ongoing bronchitis.  MD advised to call back if worse cough or fever.  Feeling worse since 05/19/13 with constant, throbbing sinus headache, malaise, and head congestion.  Reviewed Health History In EMR: Yes  Reviewed Medications In EMR: Yes  Reviewed Allergies In EMR: Yes  Reviewed Surgeries / Procedures: Yes  Date of Onset of Symptoms: 05/19/2013  Treatments Tried: Zpack, Cheratussin  Treatments Tried Worked: No OB / GYN:  LMP: Unknown  Guideline(s) Used:  Sinus Pain and Congestion  Disposition Per Guideline:   See Today or Tomorrow in Office  Reason For Disposition Reached:   Nasal discharge present > 10 days  Advice Given:  Reassurance:   Sinus congestion is a normal part of a cold.  Usually home treatment with nasal washes can prevent an actual bacterial sinus infection.  For a Runny Nose With Profuse Discharge:  Nasal mucus and discharge helps to wash viruses and bacteria out of the nose and sinuses.  Blowing the nose is all that is needed.  For a Stuffy Nose - Use Nasal Washes:  Introduction: Saline (salt water) nasal irrigation (nasal wash) is an effective and simple home remedy for treating stuffy nose and sinus congestion. The nose can be irrigated by pouring, spraying, or squirting salt water into the nose and then letting it run back out.  How it Helps: The salt water rinses out excess mucus, washes out any irritants (dust, allergens) that might be present, and moistens the nasal cavity.  Medicines for a Stuffy  or Runny Nose:  Most cold medicines that are available over-the-counter (OTC) are not helpful.  Antihistamines: Are only helpful if you also have nasal allergies.  Pain and Fever Medicines:  For pain or fever relief, take either acetaminophen or ibuprofen.  Hydration:  Drink plenty of liquids (6-8 glasses of water daily). If the air in your home is dry, use a cool mist humidifier  Hydration:  Drink plenty of liquids (6-8 glasses of water daily). If the air in your home is dry, use a cool mist humidifier  Expected Course:  Sinus congestion from viral upper respiratory infections (colds) usually lasts 5-10 days.  Occasionally a cold can worsen and turn into bacterial sinusitis. Clues to this are sinus symptoms lasting longer than 10 days, fever lasting longer than 3 days, and worsening pain. Bacterial sinusitis may need antibiotic treatment.  Call Back If:   Sinus congestion (fullness) lasts longer than 10 days  You become worse.  Patient Will Follow Care Advice:  YES  Appointment Scheduled:  05/20/2013 14:30:00 Appointment Scheduled Provider:  Crawford Givens Clelia Croft) [facility])

## 2013-05-20 NOTE — Progress Notes (Signed)
Since last seen on 05/17/13, the cough is some better with cheratussin.  No fevers documented but she felt hot on Sunday AM.  She does have nasal/facial congestion.  Fatigued.  No ear pain but ears have been ringing.  No ST.    Meds, vitals, and allergies reviewed.   ROS: See HPI.  Otherwise, noncontributory.  nad ncat Tm w/o erythema Nasal exam stuffy Slightly ttp on maxillary sinuses OP wnl except for mild cobblestoning Neck supple, no LA rrr Completely CTAB, no rales in any lung fields.  abd soft Ext w/o edema, well perfused.

## 2013-05-22 DIAGNOSIS — J019 Acute sinusitis, unspecified: Secondary | ICD-10-CM | POA: Insufficient documentation

## 2013-05-22 NOTE — Assessment & Plan Note (Signed)
Would defer CXR as cough is some better and lungs completely CTAB.  Would start augmentin given the sx and the max tenderness.  F/u prn. Nontoxic.  She agrees. Supportive care o/w.

## 2013-06-11 ENCOUNTER — Other Ambulatory Visit: Payer: Self-pay

## 2013-06-11 MED ORDER — VALACYCLOVIR HCL 1 G PO TABS: 1000.0000 mg | ORAL_TABLET | Freq: Two times a day (BID) | ORAL | Status: DC

## 2013-06-11 NOTE — Telephone Encounter (Signed)
Pt request refill of valacyclovir for flare up to [facility].Please advise.

## 2013-07-02 ENCOUNTER — Ambulatory Visit (INDEPENDENT_AMBULATORY_CARE_PROVIDER_SITE_OTHER): Payer: 59 | Admitting: Family Medicine

## 2013-07-02 ENCOUNTER — Encounter: Payer: Self-pay | Admitting: Family Medicine

## 2013-07-02 VITALS — BP 102/80 | HR 76 | Temp 97.9°F | Wt 242.0 lb

## 2013-07-02 DIAGNOSIS — M25571 Pain in right ankle and joints of right foot: Secondary | ICD-10-CM | POA: Insufficient documentation

## 2013-07-02 DIAGNOSIS — Z634 Disappearance and death of family member: Secondary | ICD-10-CM

## 2013-07-02 DIAGNOSIS — F4321 Adjustment disorder with depressed mood: Secondary | ICD-10-CM | POA: Insufficient documentation

## 2013-07-02 DIAGNOSIS — M25579 Pain in unspecified ankle and joints of unspecified foot: Secondary | ICD-10-CM

## 2013-07-02 MED ORDER — SERTRALINE HCL 50 MG PO TABS: 50.0000 mg | ORAL_TABLET | Freq: Every day | ORAL | Status: DC

## 2013-07-02 NOTE — Patient Instructions (Addendum)
Good to see you.  Please wean off lexapro- 1/2 tablet every other day for one week. Start zoloft right away- 1 tablet daily.  Call me in 2 weeks with an update.  Please call Dr. Despina Hick when you leave here.  Call me if you need [place] to help.

## 2013-07-02 NOTE — Progress Notes (Signed)
Subjective:    Patient ID: Crystal Houston, female    DOB: 1971-01-27, 42 y.o.   MRN: [id number]  HPI  Very pleasant 42 yo female here for:  1.  Right ankle pain/redness x 4 days.  Redness has improved.  It is over scar and area where she had ORIF over a year ago. She feels like "screws" are out of place.  No fevers or chills.  No n/v.  2. ?depression- daughter died of cancer 4 months ago.  Going to hospice for grief counseling every 2 weeks.  Initially she felt this was helping, but lately feels more anxious and depressed.  Has panic attacks when she drives home.  No SI or HI.  Scared to be sad in front of her other children.  On Lexapro 20 mg daily and Buspar.  She feels she does have a good support system.  Patient Active Problem List   Diagnosis Date Noted  . Right ankle pain 07/02/2013  . Grief at loss of child 07/02/2013  . Sinusitis, acute 05/22/2013  . Acute bronchitis 05/17/2013  . Obesity 05/03/2011  . ANXIETY 12/21/2010  . ADJ DISORDER WITH MIXED ANXIETY & DEPRESSED MOOD 12/21/2010  . DEPRESSION 12/21/2010   Past Medical History  Diagnosis Date  . Anxiety   . Depression   . Anxiety    Past Surgical History  Procedure Laterality Date  . Fracture surgery    . Orif ankle fracture  11/10/2011    Procedure: OPEN REDUCTION INTERNAL FIXATION (ORIF) ANKLE FRACTURE;  Surgeon: Loanne Drilling;  Location: [facility] OR;  Service: Orthopedics;  Laterality: Right;  with screw applicaton  . I&d extremity  11/10/2011    Procedure: IRRIGATION AND DEBRIDEMENT EXTREMITY;  Surgeon: Loanne Drilling;  Location: [facility] OR;  Service: Orthopedics;  Laterality: Right;   History  Substance Use Topics  . Smoking status: Never Smoker   . Smokeless tobacco: Never Used  . Alcohol Use: 1.2 oz/week    2 Glasses of wine per week     Comment: Very rare   Family History  Problem Relation Age of Onset  . Cancer Daughter     osteosarcoma of leg  . Cancer Paternal Grandfather     colon   No Known  Allergies Current Outpatient Prescriptions on File Prior to Visit  Medication Sig Dispense Refill  . busPIRone (BUSPAR) 30 MG tablet 1 tablet by mouth twice daily  180 tablet  3  . cetirizine (ZYRTEC) 10 MG tablet Take 10 mg by mouth daily.      . clonazePAM (KLONOPIN) 0.5 MG tablet Take 1 tablet (0.5 mg total) by mouth 3 (three) times daily as needed. Take as needed for anxiety  90 tablet  0  . ibuprofen (ADVIL,MOTRIN) 200 MG tablet Take 200 mg by mouth every 6 (six) hours as needed.      Marland Kitchen levonorgestrel (MIRENA) 20 MCG/24HR IUD 1 each by Intrauterine route once.       No current facility-administered medications on file prior to visit.   The PMH, PSH, Social History, Family History, Medications, and allergies have been reviewed in [facility], and have been updated if relevant.     Review of Systems    See HPI Objective:   Physical Exam BP 102/80  Pulse 76  Temp(Src) 97.9 F (36.6 C)  Wt 242 lb (109.77 kg)  BMI 36.26 kg/m2 Gen:  Alert, pleasant, NAD Right lower extremity: Well healed vertical incision right ankle, mild erythema, +/- warmth, she is TTP over  area Psych:   Good eye contact, tearful but appropriate     Assessment & Plan:  1. Right ankle pain New- I am concerned about this area given post op/with hardware.  Advised seeing Dr. Despina Hick immediately.  If she is unable to get appt, she will call me back today. The patient indicates understanding of these issues and agrees with the plan.   2. Grief at loss of child Although I feel her reaction and symptoms are very appropriate, she does want to try to adjust medications and continue with hospice counseling.  She feels Lexapro is the issue. Will wean off Lexapro, start Zoloft 50 mg daily. Follow up in 2 weeks. The patient indicates understanding of these issues and agrees with the plan.

## 2013-08-06 ENCOUNTER — Encounter: Payer: Self-pay | Admitting: Family Medicine

## 2013-08-06 ENCOUNTER — Ambulatory Visit (INDEPENDENT_AMBULATORY_CARE_PROVIDER_SITE_OTHER): Payer: 59 | Admitting: Family Medicine

## 2013-08-06 VITALS — BP 102/80 | HR 84 | Temp 97.9°F | Wt 243.0 lb

## 2013-08-06 DIAGNOSIS — F4321 Adjustment disorder with depressed mood: Secondary | ICD-10-CM

## 2013-08-06 MED ORDER — SERTRALINE HCL 100 MG PO TABS: 100.0000 mg | ORAL_TABLET | Freq: Every day | ORAL | Status: DC

## 2013-08-06 NOTE — Patient Instructions (Signed)
Good to see you. Please increase your Zoloft to 100 mg daily.  We will call you with your appointment to see Dr. Laymond Purser.

## 2013-08-06 NOTE — Progress Notes (Signed)
Subjective:    Patient ID: Crystal Houston, female    DOB: 1971-05-06, 42 y.o.   MRN: [id number]  HPI  Very pleasant 42 yo female here for:  Follow up depression- daughter died of cancer 6 months ago.  Going to hospice for grief counseling every 2 weeks.    When I saw her in July, she had worsening symptoms of depression and she felt lexapro was the issue.  We weaned her off of this and started Zoloft 50 mg daily and continued Buspar.  Initially felt this was helping but now more irritable.  Feels she is taking things out on her children and friends and does not want to do that.  No SI or HI.  Patient Active Problem List   Diagnosis Date Noted  . Right ankle pain 07/02/2013  . Grief at loss of child 07/02/2013  . Sinusitis, acute 05/22/2013  . Acute bronchitis 05/17/2013  . Obesity 05/03/2011  . ANXIETY 12/21/2010  . ADJ DISORDER WITH MIXED ANXIETY & DEPRESSED MOOD 12/21/2010  . DEPRESSION 12/21/2010   Past Medical History  Diagnosis Date  . Anxiety   . Depression   . Anxiety    Past Surgical History  Procedure Laterality Date  . Fracture surgery    . Orif ankle fracture  11/10/2011    Procedure: OPEN REDUCTION INTERNAL FIXATION (ORIF) ANKLE FRACTURE;  Surgeon: Loanne Drilling;  Location: [facility];  Service: Orthopedics;  Laterality: Right;  with screw applicaton  . I&d extremity  11/10/2011    Procedure: IRRIGATION AND DEBRIDEMENT EXTREMITY;  Surgeon: Loanne Drilling;  Location: [facility];  Service: Orthopedics;  Laterality: Right;   History  Substance Use Topics  . Smoking status: Never Smoker   . Smokeless tobacco: Never Used  . Alcohol Use: 1.2 oz/week    2 Glasses of wine per week     Comment: Very rare   Family History  Problem Relation Age of Onset  . Cancer Daughter     osteosarcoma of leg  . Cancer Paternal Grandfather     colon   No Known Allergies Current Outpatient Prescriptions on File Prior to Visit  Medication Sig Dispense Refill  . busPIRone (BUSPAR)  30 MG tablet 1 tablet by mouth twice daily  180 tablet  3  . cetirizine (ZYRTEC) 10 MG tablet Take 10 mg by mouth daily.      . clonazePAM (KLONOPIN) 0.5 MG tablet Take 1 tablet (0.5 mg total) by mouth 3 (three) times daily as needed. Take as needed for anxiety  90 tablet  0  . ibuprofen (ADVIL,MOTRIN) 200 MG tablet Take 200 mg by mouth every 6 (six) hours as needed.      Marland Kitchen levonorgestrel (MIRENA) 20 MCG/24HR IUD 1 each by Intrauterine route once.      . sertraline (ZOLOFT) 50 MG tablet Take 1 tablet (50 mg total) by mouth daily.  30 tablet  3   No current facility-administered medications on file prior to visit.   The PMH, PSH, Social History, Family History, Medications, and allergies have been reviewed in [facility], and have been updated if relevant.     Review of Systems    See HPI Objective:   Physical Exam BP 102/80  Pulse 84  Temp(Src) 97.9 F (36.6 C)  Wt 243 lb (110.224 kg)  BMI 36.41 kg/m2 Gen:  Alert, pleasant, NAD Psych:   Good eye contact, tearful but appropriate     Assessment & Plan:   1. Grief at loss of  child Although I feel her reaction and symptoms are very appropriate, I do think she would benefit from psychotherapy and medication adjustment. Will refer to Dr. Laymond Purser, increase Zoloft to 100 mg daily. Call or return to clinic prn if these symptoms worsen or fail to improve as anticipated. The patient indicates understanding of these issues and agrees with the plan.

## 2013-08-13 ENCOUNTER — Other Ambulatory Visit: Payer: Self-pay | Admitting: Family Medicine

## 2013-08-13 ENCOUNTER — Encounter: Payer: Self-pay | Admitting: Family Medicine

## 2013-08-13 DIAGNOSIS — Z1231 Encounter for screening mammogram for malignant neoplasm of breast: Secondary | ICD-10-CM

## 2013-08-20 ENCOUNTER — Ambulatory Visit (INDEPENDENT_AMBULATORY_CARE_PROVIDER_SITE_OTHER): Payer: 59 | Admitting: Family Medicine

## 2013-08-20 ENCOUNTER — Encounter: Payer: Self-pay | Admitting: Family Medicine

## 2013-08-20 VITALS — BP 102/80 | HR 74 | Temp 97.9°F | Wt 243.0 lb

## 2013-08-20 DIAGNOSIS — J069 Acute upper respiratory infection, unspecified: Secondary | ICD-10-CM

## 2013-08-20 MED ORDER — BUSPIRONE HCL 30 MG PO TABS: ORAL_TABLET | ORAL | Status: DC

## 2013-08-20 MED ORDER — HYDROCOD POLST-CHLORPHEN POLST 10-8 MG/5ML PO LQCR: 5.0000 mL | Freq: Two times a day (BID) | ORAL | Status: DC | PRN

## 2013-08-20 NOTE — Progress Notes (Signed)
  SUBJECTIVE:  Crystal Houston is a 42 y.o. female who complains of coryza, congestion and dry cough for 6 days. She denies a history of anorexia, chest pain, dizziness, fatigue, fevers, vomiting and weakness and denies a history of asthma. Patient denies smoke cigarettes.   Patient Active Problem List   Diagnosis Date Noted  . Grief at loss of child 07/02/2013  . Obesity 05/03/2011  . ANXIETY 12/21/2010  . ADJ DISORDER WITH MIXED ANXIETY & DEPRESSED MOOD 12/21/2010  . DEPRESSION 12/21/2010   Past Medical History  Diagnosis Date  . Anxiety   . Depression   . Anxiety    Past Surgical History  Procedure Laterality Date  . Fracture surgery    . Orif ankle fracture  11/10/2011    Procedure: OPEN REDUCTION INTERNAL FIXATION (ORIF) ANKLE FRACTURE;  Surgeon: Loanne Drilling;  Location: [facility];  Service: Orthopedics;  Laterality: Right;  with screw applicaton  . I&d extremity  11/10/2011    Procedure: IRRIGATION AND DEBRIDEMENT EXTREMITY;  Surgeon: Loanne Drilling;  Location: [facility];  Service: Orthopedics;  Laterality: Right;   History  Substance Use Topics  . Smoking status: Never Smoker   . Smokeless tobacco: Never Used  . Alcohol Use: 1.2 oz/week    2 Glasses of wine per week     Comment: Very rare   Family History  Problem Relation Age of Onset  . Cancer Daughter     osteosarcoma of leg  . Cancer Paternal Grandfather     colon   No Known Allergies Current Outpatient Prescriptions on File Prior to Visit  Medication Sig Dispense Refill  . cetirizine (ZYRTEC) 10 MG tablet Take 10 mg by mouth daily.      . clonazePAM (KLONOPIN) 0.5 MG tablet Take 1 tablet (0.5 mg total) by mouth 3 (three) times daily as needed. Take as needed for anxiety  90 tablet  0  . ibuprofen (ADVIL,MOTRIN) 200 MG tablet Take 200 mg by mouth every 6 (six) hours as needed.      Marland Kitchen levonorgestrel (MIRENA) 20 MCG/24HR IUD 1 each by Intrauterine route once.      . sertraline (ZOLOFT) 100 MG tablet Take 1 tablet  (100 mg total) by mouth daily.  30 tablet  3   No current facility-administered medications on file prior to visit.   The PMH, PSH, Social History, Family History, Medications, and allergies have been reviewed in [facility], and have been updated if relevant.  OBJECTIVE: BP 102/80  Pulse 74  Temp(Src) 97.9 F (36.6 C)  Wt 243 lb (110.224 kg)  BMI 36.41 kg/m2  SpO2 97%  She appears well, vital signs are as noted. Ears normal.  Throat and pharynx normal.  Neck supple. No adenopathy in the neck. Nose is congested. Sinuses non tender. The chest is clear, without wheezes or rales.  ASSESSMENT:  viral upper respiratory illness  PLAN: Symptomatic therapy suggested: push fluids, rest and return office visit prn if symptoms persist or worsen. Lack of antibiotic effectiveness discussed with her. Call or return to clinic prn if these symptoms worsen or fail to improve as anticipated.

## 2013-08-28 ENCOUNTER — Ambulatory Visit: Payer: 59 | Admitting: Psychology

## 2013-09-11 ENCOUNTER — Ambulatory Visit (INDEPENDENT_AMBULATORY_CARE_PROVIDER_SITE_OTHER): Payer: 59 | Admitting: Psychology

## 2013-09-11 DIAGNOSIS — F4321 Adjustment disorder with depressed mood: Secondary | ICD-10-CM

## 2013-09-24 ENCOUNTER — Ambulatory Visit (INDEPENDENT_AMBULATORY_CARE_PROVIDER_SITE_OTHER): Payer: 59 | Admitting: Psychology

## 2013-09-24 DIAGNOSIS — F4321 Adjustment disorder with depressed mood: Secondary | ICD-10-CM

## 2013-10-08 ENCOUNTER — Encounter: Payer: Self-pay | Admitting: Family Medicine

## 2013-10-08 ENCOUNTER — Ambulatory Visit (INDEPENDENT_AMBULATORY_CARE_PROVIDER_SITE_OTHER): Payer: 59 | Admitting: Psychology

## 2013-10-08 DIAGNOSIS — F4321 Adjustment disorder with depressed mood: Secondary | ICD-10-CM

## 2013-10-09 ENCOUNTER — Other Ambulatory Visit: Payer: Self-pay | Admitting: *Deleted

## 2013-10-09 MED ORDER — SERTRALINE HCL 100 MG PO TABS: 100.0000 mg | ORAL_TABLET | Freq: Every day | ORAL | Status: DC

## 2013-10-10 ENCOUNTER — Other Ambulatory Visit: Payer: Self-pay

## 2013-10-22 ENCOUNTER — Ambulatory Visit (INDEPENDENT_AMBULATORY_CARE_PROVIDER_SITE_OTHER): Payer: 59 | Admitting: Psychology

## 2013-10-22 DIAGNOSIS — F4321 Adjustment disorder with depressed mood: Secondary | ICD-10-CM

## 2013-11-02 ENCOUNTER — Emergency Department (INDEPENDENT_AMBULATORY_CARE_PROVIDER_SITE_OTHER): Payer: 59

## 2013-11-02 ENCOUNTER — Emergency Department (HOSPITAL_COMMUNITY)
Admission: EM | Admit: 2013-11-02 | Discharge: 2013-11-02 | Disposition: A | Discharge status: Home / Self Care | Payer: 59 | Source: Home / Self Care

## 2013-11-02 ENCOUNTER — Encounter (HOSPITAL_COMMUNITY): Payer: Self-pay | Admitting: Emergency Medicine

## 2013-11-02 DIAGNOSIS — S8010XA Contusion of unspecified lower leg, initial encounter: Secondary | ICD-10-CM

## 2013-11-02 DIAGNOSIS — S93409A Sprain of unspecified ligament of unspecified ankle, initial encounter: Secondary | ICD-10-CM

## 2013-11-02 DIAGNOSIS — S93401A Sprain of unspecified ligament of right ankle, initial encounter: Secondary | ICD-10-CM

## 2013-11-02 DIAGNOSIS — W108XXA Fall (on) (from) other stairs and steps, initial encounter: Secondary | ICD-10-CM

## 2013-11-02 DIAGNOSIS — S8011XA Contusion of right lower leg, initial encounter: Secondary | ICD-10-CM

## 2013-11-02 MED ORDER — HYDROCODONE-ACETAMINOPHEN 5-325 MG PO TABS: 1.0000 | ORAL_TABLET | ORAL | Status: DC | PRN

## 2013-11-02 NOTE — ED Provider Notes (Signed)
Medical screening examination/treatment/procedure(s) were performed by a resident physician or non-physician practitioner and as the supervising physician I was immediately available for consultation/collaboration.  Clementeen Graham, MD    Rodolph Bong, MD 11/02/13 [phone]

## 2013-11-02 NOTE — ED Provider Notes (Signed)
CSN: [id number]     Arrival date & time 11/02/13  0909 History   First MD Initiated Contact with Patient 11/02/13 0935     Chief Complaint  Patient presents with  . Ankle Pain   (Consider location/radiation/quality/duration/timing/severity/associated sxs/prior Treatment) HPI Comments: As above, this 42 year old female fell and injured her right tib-fib and left ankle. Left ankle has mild swelling but no bony tenderness. And she is able to bear weight but with some pain. No injury to the left knee lower leg or foot. Right tib-fib as a source of her greatest pain where there is tense swelling and tenderness midway between the knee and the foot. Denies pain or discomfort in the knee, ankle or right foot. Denies other injuries.   Past Medical History  Diagnosis Date  . Anxiety   . Depression   . Anxiety    Past Surgical History  Procedure Laterality Date  . Fracture surgery    . Orif ankle fracture  11/10/2011    Procedure: OPEN REDUCTION INTERNAL FIXATION (ORIF) ANKLE FRACTURE;  Surgeon: Loanne Drilling;  Location: [facility];  Service: Orthopedics;  Laterality: Right;  with screw applicaton  . I&d extremity  11/10/2011    Procedure: IRRIGATION AND DEBRIDEMENT EXTREMITY;  Surgeon: Loanne Drilling;  Location: [facility];  Service: Orthopedics;  Laterality: Right;   Family History  Problem Relation Age of Onset  . Cancer Daughter     osteosarcoma of leg  . Cancer Paternal Grandfather     colon   History  Substance Use Topics  . Smoking status: Never Smoker   . Smokeless tobacco: Never Used  . Alcohol Use: 1.2 oz/week    2 Glasses of wine per week     Comment: Very rare   OB History   Grav Para Term Preterm Abortions TAB SAB Ect Mult Living                 Review of Systems  Constitutional: Negative.  Negative for fever, chills and activity change.  HENT: Negative.   Respiratory: Negative.   Cardiovascular: Negative.   Musculoskeletal: Positive for joint swelling. Negative for back  pain, neck pain and neck stiffness.       As per HPI  Skin: Negative for color change, pallor and rash.  Neurological: Negative.     Allergies  Review of patient's allergies indicates no known allergies.  Home Medications   Current Outpatient Rx  Name  Route  Sig  Dispense  Refill  . cetirizine (ZYRTEC) 10 MG tablet   Oral   Take 10 mg by mouth daily.         . clonazePAM (KLONOPIN) 0.5 MG tablet   Oral   Take 1 tablet (0.5 mg total) by mouth 3 (three) times daily as needed. Take as needed for anxiety   90 tablet   0   . ibuprofen (ADVIL,MOTRIN) 200 MG tablet   Oral   Take 200 mg by mouth every 6 (six) hours as needed.         Marland Kitchen levonorgestrel (MIRENA) 20 MCG/24HR IUD   Intrauterine   1 each by Intrauterine route once.         . sertraline (ZOLOFT) 100 MG tablet   Oral   Take 1 tablet (100 mg total) by mouth daily.   90 tablet   0   . HYDROcodone-acetaminophen (NORCO/VICODIN) 5-325 MG per tablet   Oral   Take 1 tablet by mouth every 4 (four) hours as needed.  10 tablet   0    BP 134/92  Pulse 83  Temp(Src) 98.3 F (36.8 C) (Oral)  Resp 18  SpO2 99% Physical Exam  Nursing note and vitals reviewed. Constitutional: She is oriented to person, place, and time. She appears well-developed and well-nourished. No distress.  HENT:  Head: Normocephalic and atraumatic.  Eyes: EOM are normal.  Neck: Normal range of motion. Neck supple.  Cardiovascular: Normal rate.   Pulmonary/Chest: Effort normal. No respiratory distress.  Musculoskeletal: She exhibits tenderness.  Left ankle with puffiness surrounding the medial and lateral malleolus. No bony tenderness. Full range of motion of the ankle. Pulses 2+. No discoloration. Normal warmth. Mild tenderness. Right lower extremity reveals tense swelling to the mid tib-fib area. The ankle and foot have no apparent injury with no tenderness, swelling or discoloration. Full range of motion. Knee exam is normal.   Neurological: She is alert and oriented to person, place, and time. No cranial nerve deficit.  Skin: Skin is warm and dry. No rash noted. No erythema.  Psychiatric: She has a normal mood and affect.    ED Course  Procedures (including critical care time) Labs Review Labs Reviewed - No data to display Imaging Review Dg Tibia/fibula Right  11/02/2013   CLINICAL DATA:  Pain post fall  EXAM: RIGHT TIBIA AND FIBULA - 2 VIEW  COMPARISON:  11/11/2011  FINDINGS: There is no evidence of fracture or other focal bone lesions. Soft tissues are unremarkable. Previous plate and screw fixation of the distal fibular shaft with solid-appearing healing. Stable screws across the medial malleolus.  IMPRESSION: No fracture or other acute abnormality   Electronically Signed   By: Oley Balm M.D.   On: 11/02/2013 10:17   Dg Ankle Complete Left  11/02/2013   CLINICAL DATA:  Fall and twisted ankle.  Pain in the entire ankle.  EXAM: LEFT ANKLE COMPLETE - 3+ VIEW  COMPARISON:  None.  FINDINGS: Three views of the ankle were obtained. There appears to be soft tissue swelling. Small ossification along the distal aspect of the fibula is well corticated and appears chronic. No evidence for an acute fracture or dislocation. Prominent spur along the plantar aspect of the calcaneus. There may be an anterior joint effusion.  IMPRESSION: Concern for soft tissue swelling and joint effusion. No acute bone abnormality.   Electronically Signed   By: Richarda Overlie M.D.   On: 11/02/2013 10:13      MDM   1. Contusion of calf, right, initial encounter   2. Ankle sprain, right, initial encounter   3. Fall down steps, initial encounter    REchk RLE at D/C with good circulation, pulse, color and warmth.   RICE  Limit wt bearing for a few days but dont remain immobile.  ACE to R LE  ASO L ankle Discussed sx's of compartment syndrome and to seek medical attention urgently for these sx's.  Norco 5mg  #10  Hayden Rasmussen,  NP 11/02/13 1036

## 2013-11-02 NOTE — ED Notes (Signed)
Patient fell this morning letting the dog out, now has right leg pain (previous surgery on that leg /has a plate in it) and left ankle pain from today's fall

## 2013-11-05 ENCOUNTER — Ambulatory Visit: Payer: 59 | Admitting: Psychology

## 2013-11-08 ENCOUNTER — Ambulatory Visit (INDEPENDENT_AMBULATORY_CARE_PROVIDER_SITE_OTHER): Payer: 59 | Admitting: Internal Medicine

## 2013-11-08 ENCOUNTER — Encounter: Payer: Self-pay | Admitting: Internal Medicine

## 2013-11-08 VITALS — BP 118/82 | HR 80 | Temp 97.8°F | Wt 246.5 lb

## 2013-11-08 DIAGNOSIS — S8011XA Contusion of right lower leg, initial encounter: Secondary | ICD-10-CM

## 2013-11-08 DIAGNOSIS — S8010XA Contusion of unspecified lower leg, initial encounter: Secondary | ICD-10-CM

## 2013-11-08 NOTE — Assessment & Plan Note (Signed)
No secondary infection Has worsened Reassured Discussed typical time course---at least a month

## 2013-11-08 NOTE — Progress Notes (Signed)
Subjective:    Patient ID: Crystal Houston, female    DOB: January 12, 1971, 42 y.o.   MRN: [id number]  HPI Was carrying injury dog down steps Slid on steps and feet came out from under her Hit the right calf on concrete step and had pain in left ankle The knot on right leg got so big---so she went to urgent care---notes reviewed  Used ace on right calf and brace on left ankle--she has now stopped this Now having increased pain--like stabbing or burning in right calf Right ankle is turning black and feels "on fire" at times  Started heating pad on right calf last night per instructions Then had throbbing pain from wrap Knot on calf is still tight  Current Outpatient Prescriptions on File Prior to Visit  Medication Sig Dispense Refill  . cetirizine (ZYRTEC) 10 MG tablet Take 10 mg by mouth daily.      . clonazePAM (KLONOPIN) 0.5 MG tablet Take 1 tablet (0.5 mg total) by mouth 3 (three) times daily as needed. Take as needed for anxiety  90 tablet  0  . HYDROcodone-acetaminophen (NORCO/VICODIN) 5-325 MG per tablet Take 1 tablet by mouth every 4 (four) hours as needed.  10 tablet  0  . ibuprofen (ADVIL,MOTRIN) 200 MG tablet Take 200 mg by mouth every 6 (six) hours as needed.      Marland Kitchen levonorgestrel (MIRENA) 20 MCG/24HR IUD 1 each by Intrauterine route once.      . sertraline (ZOLOFT) 100 MG tablet Take 1 tablet (100 mg total) by mouth daily.  90 tablet  0   No current facility-administered medications on file prior to visit.    No Known Allergies  Past Medical History  Diagnosis Date  . Anxiety   . Depression   . Anxiety     Past Surgical History  Procedure Laterality Date  . Fracture surgery    . Orif ankle fracture  11/10/2011    Procedure: OPEN REDUCTION INTERNAL FIXATION (ORIF) ANKLE FRACTURE;  Surgeon: Loanne Drilling;  Location: [facility];  Service: Orthopedics;  Laterality: Right;  with screw applicaton  . I&d extremity  11/10/2011    Procedure: IRRIGATION AND DEBRIDEMENT  EXTREMITY;  Surgeon: Loanne Drilling;  Location: [facility];  Service: Orthopedics;  Laterality: Right;    Family History  Problem Relation Age of Onset  . Cancer Daughter     osteosarcoma of leg  . Cancer Paternal Grandfather     colon    History   Social History  . Marital Status: Married    Spouse Name: N/A    Number of Children: 3  . 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 Smoking status: Never Smoker   . Smokeless tobacco: Never Used  . Alcohol Use: 1.2 oz/week    2 Glasses of wine per week     Comment: Very rare  . Drug Use: No  . Sexual Activity: No   Other Topics Concern  . Not on file   Social History Narrative   Daughter with osteosarcoma of leg.   Review of Systems No skin break on leg No fever    Objective:   Physical Exam  Constitutional: She appears well-developed and well-nourished. No distress.  Musculoskeletal:  Normal sensation and pulses in feet  Skin:  Ecchymoses on medial right ankle---no warmth or tenderness  Hematoma on medial right calf---a little below middle of calf Not warm  or tender          Assessment & Plan:

## 2013-11-08 NOTE — Progress Notes (Signed)
Pre-visit discussion using our clinic review tool. No additional management support is needed unless otherwise documented below in the visit note.  

## 2014-02-14 ENCOUNTER — Ambulatory Visit (INDEPENDENT_AMBULATORY_CARE_PROVIDER_SITE_OTHER): Payer: 59 | Admitting: Family Medicine

## 2014-02-14 ENCOUNTER — Encounter: Payer: Self-pay | Admitting: Family Medicine

## 2014-02-14 VITALS — BP 118/62 | HR 71 | Temp 98.1°F | Ht 67.0 in | Wt 249.0 lb

## 2014-02-14 DIAGNOSIS — F4321 Adjustment disorder with depressed mood: Secondary | ICD-10-CM

## 2014-02-14 DIAGNOSIS — E669 Obesity, unspecified: Secondary | ICD-10-CM

## 2014-02-14 DIAGNOSIS — Z634 Disappearance and death of family member: Secondary | ICD-10-CM

## 2014-02-14 DIAGNOSIS — Z1231 Encounter for screening mammogram for malignant neoplasm of breast: Secondary | ICD-10-CM

## 2014-02-14 NOTE — Progress Notes (Signed)
Subjective:    Patient ID: Crystal Houston, female    DOB: May 31, 1971, 43 y.o.   MRN: [id number]  HPI  Very pleasant 43 yo female here for:  Follow up depression- daughter died of cancer last year.  She is coming up on the year anniversary.  When I saw her in September, she had worsening symptoms of depression, we increased her Zoloft to 100 mg daily and referred her to Dr. Evalina Field for psychotherapy.  Insurance would not cover it so had to stop seeing Erskine Squibb.  No SI or HI.  Feels she is doing ok- found a new normal.  Still having some sleep issues- klonipin does seem to help.  Was told at work that she needed to lose 10% of her body weight or the cost of her insurance would increase.  She has already tried phentermine in past without success and was told she "was not fat enough for bariatric surgery."   Patient Active Problem List   Diagnosis Date Noted  . Traumatic hematoma of right lower leg 11/08/2013  . Grief at loss of child 07/02/2013  . Obesity 05/03/2011  . ANXIETY 12/21/2010  . ADJ DISORDER WITH MIXED ANXIETY & DEPRESSED MOOD 12/21/2010  . DEPRESSION 12/21/2010   Past Medical History  Diagnosis Date  . Anxiety   . Depression   . Anxiety    Past Surgical History  Procedure Laterality Date  . Fracture surgery    . Orif ankle fracture  11/10/2011    Procedure: OPEN REDUCTION INTERNAL FIXATION (ORIF) ANKLE FRACTURE;  Surgeon: Loanne Drilling;  Location: [facility];  Service: Orthopedics;  Laterality: Right;  with screw applicaton  . I&d extremity  11/10/2011    Procedure: IRRIGATION AND DEBRIDEMENT EXTREMITY;  Surgeon: Loanne Drilling;  Location: [facility];  Service: Orthopedics;  Laterality: Right;   History  Substance Use Topics  . Smoking status: Never Smoker   . Smokeless tobacco: Never Used  . Alcohol Use: 1.2 oz/week    2 Glasses of wine per week     Comment: Very rare   Family History  Problem Relation Age of Onset  . Cancer Daughter     osteosarcoma of leg  .  Cancer Paternal Grandfather     colon   No Known Allergies Current Outpatient Prescriptions on File Prior to Visit  Medication Sig Dispense Refill  . cetirizine (ZYRTEC) 10 MG tablet Take 10 mg by mouth daily.      . clonazePAM (KLONOPIN) 0.5 MG tablet Take 1 tablet (0.5 mg total) by mouth 3 (three) times daily as needed. Take as needed for anxiety  90 tablet  0  . ibuprofen (ADVIL,MOTRIN) 200 MG tablet Take 200 mg by mouth every 6 (six) hours as needed.      Marland Kitchen levonorgestrel (MIRENA) 20 MCG/24HR IUD 1 each by Intrauterine route once.      . sertraline (ZOLOFT) 100 MG tablet Take 1 tablet (100 mg total) by mouth daily.  90 tablet  0   No current facility-administered medications on file prior to visit.   The PMH, PSH, Social History, Family History, Medications, and allergies have been reviewed in [facility], and have been updated if relevant.     Review of Systems    See HPI Objective:   Physical Exam BP 118/62  Pulse 71  Temp(Src) 98.1 F (36.7 C) (Oral)  Ht 5\' 7"  (1.702 m)  Wt 249 lb (112.946 kg)  BMI 38.99 kg/m2  SpO2 91% Gen:  Alert, pleasant,  NAD Psych:   Good eye contact, tearful but appropriate     Assessment & Plan:

## 2014-02-14 NOTE — Assessment & Plan Note (Signed)
She is inquiring about Belviq but unfortunately she is not a candidate to take this because she is taking an SSRI- would put her at risk for serotonin syndrome. Offered referral to nutritionist but she declined. She wants to rejoin weight watchers because her employer is offering discounted rates.

## 2014-02-14 NOTE — Assessment & Plan Note (Signed)
Coming up on the 1 year anniversary. She has good support and wants to continue current dose of zoloft and prn klonipin. She will continue hospice grief counseling if her symptoms deteriorate.

## 2014-03-10 ENCOUNTER — Encounter: Payer: Self-pay | Admitting: Family Medicine

## 2014-03-10 ENCOUNTER — Ambulatory Visit (INDEPENDENT_AMBULATORY_CARE_PROVIDER_SITE_OTHER): Payer: 59 | Admitting: Family Medicine

## 2014-03-10 ENCOUNTER — Other Ambulatory Visit (HOSPITAL_COMMUNITY)
Admission: RE | Admit: 2014-03-10 | Discharge: 2014-03-10 | Disposition: A | Discharge status: Home / Self Care | Payer: 59 | Source: Ambulatory Visit | Attending: Family Medicine | Admitting: Family Medicine

## 2014-03-10 VITALS — BP 128/70 | HR 89 | Temp 98.0°F | Ht 67.75 in | Wt 248.0 lb

## 2014-03-10 DIAGNOSIS — Z136 Encounter for screening for cardiovascular disorders: Secondary | ICD-10-CM

## 2014-03-10 DIAGNOSIS — Z0000 Encounter for general adult medical examination without abnormal findings: Secondary | ICD-10-CM | POA: Insufficient documentation

## 2014-03-10 DIAGNOSIS — Z113 Encounter for screening for infections with a predominantly sexual mode of transmission: Secondary | ICD-10-CM | POA: Insufficient documentation

## 2014-03-10 DIAGNOSIS — N760 Acute vaginitis: Secondary | ICD-10-CM | POA: Insufficient documentation

## 2014-03-10 DIAGNOSIS — R5383 Other fatigue: Secondary | ICD-10-CM

## 2014-03-10 DIAGNOSIS — Z01419 Encounter for gynecological examination (general) (routine) without abnormal findings: Secondary | ICD-10-CM

## 2014-03-10 DIAGNOSIS — R5381 Other malaise: Secondary | ICD-10-CM

## 2014-03-10 MED ORDER — VALACYCLOVIR HCL 1 G PO TABS: 1000.0000 mg | ORAL_TABLET | Freq: Two times a day (BID) | ORAL | Status: DC

## 2014-03-10 NOTE — Assessment & Plan Note (Signed)
Check labs today.

## 2014-03-10 NOTE — Addendum Note (Signed)
Addended by: Baldomero LamyHAVERS, Tyreque Finken C on: 03/10/2014 09:44 AM   Modules accepted: Orders

## 2014-03-10 NOTE — Patient Instructions (Signed)
Great to see you. Have a wonderful trip. We will call you with your lab results and you can view them online. 

## 2014-03-10 NOTE — Assessment & Plan Note (Signed)
Pap smear today. Mammogram scheduled.

## 2014-03-10 NOTE — Assessment & Plan Note (Signed)
Reviewed preventive care protocols, scheduled due services, and updated immunizations Discussed nutrition, exercise, diet, and healthy lifestyle.  Orders Placed This Encounter  Procedures  . Vitamin B12  . Vitamin D, 25-hydroxy  . TSH  . CBC with Differential  . Comprehensive metabolic panel  . Lipid panel  . HIV Antibody  . RPR

## 2014-03-10 NOTE — Progress Notes (Signed)
Pre visit review using our clinic review tool, if applicable. No additional management support is needed unless otherwise documented below in the visit note. 

## 2014-03-10 NOTE — Assessment & Plan Note (Signed)
Seems to be doing well. Has wonderful support system.  Continue Zoloft at current dose. She will update me as she gets closer to the 1 year anniversary of Taylor's death.

## 2014-03-10 NOTE — Progress Notes (Signed)
Subjective:    Patient ID: Crystal Houston, female    DOB: [birth year], 43 y.o.   MRN: [id number]  HPI  43 yo pleasant female here for CPX. Last pap smear 03/13/12 (done by me) - negative. Mammogram 03/23/2012.  Scheduled for mammogram on 4/9. Having some vulvular itching.  ? If she is about to have a herpes outbreak.  Sometimes she has itching prior to outbreak.  No lesions.  +more vaginal discharge.  Has mirena IUD- placed in October 2014. No h/o breast CA.  Anxiety-  Still taking Zoloft 100 mg daily.  Still using klonipin as needed- not too frequently.   Coming up on the one year anniversary of her daughter's death.  She will be going out of town for the anniversary with friends. Has been a little more fatigued lately. Lab Results  Component Value Date   HDL 39* 03/26/2012   LDLCALC 92 03/26/2012   TRIG 103 03/26/2012   CHOLHDL 3.9 03/26/2012     Patient Active Problem List   Diagnosis Date Noted  . Traumatic hematoma of right lower leg 11/08/2013  . Grief at loss of child 07/02/2013  . Obesity 05/03/2011  . ANXIETY 12/21/2010  . ADJ DISORDER WITH MIXED ANXIETY & DEPRESSED MOOD 12/21/2010  . DEPRESSION 12/21/2010   Past Medical History  Diagnosis Date  . Anxiety   . Depression   . Anxiety    Past Surgical History  Procedure Laterality Date  . Fracture surgery    . Orif ankle fracture  11/10/2011    Procedure: OPEN REDUCTION INTERNAL FIXATION (ORIF) ANKLE FRACTURE;  Surgeon: Loanne DrillingFrank V Aluisio;  Location: [facility] OR;  Service: Orthopedics;  Laterality: Right;  with screw applicaton  . I&d extremity  11/10/2011    Procedure: IRRIGATION AND DEBRIDEMENT EXTREMITY;  Surgeon: Loanne DrillingFrank V Aluisio;  Location: [facility] OR;  Service: Orthopedics;  Laterality: Right;   History  Substance Use Topics  . Smoking status: Never Smoker   . Smokeless tobacco: Never Used  . Alcohol Use: 1.2 oz/week    2 Glasses of wine per week     Comment: Very rare   Family History  Problem Relation Age of Onset   . Cancer Daughter     osteosarcoma of leg  . Cancer Paternal Grandfather     colon   No Known Allergies Current Outpatient Prescriptions on File Prior to Visit  Medication Sig Dispense Refill  . cetirizine (ZYRTEC) 10 MG tablet Take 10 mg by mouth daily.      . clonazePAM (KLONOPIN) 0.5 MG tablet Take 1 tablet (0.5 mg total) by mouth 3 (three) times daily as needed. Take as needed for anxiety  90 tablet  0  . ibuprofen (ADVIL,MOTRIN) 200 MG tablet Take 200 mg by mouth every 6 (six) hours as needed.      Marland Kitchen. levonorgestrel (MIRENA) 20 MCG/24HR IUD 1 each by Intrauterine route once.      . sertraline (ZOLOFT) 100 MG tablet Take 1 tablet (100 mg total) by mouth daily.  90 tablet  0   No current facility-administered medications on file prior to visit.   The PMH, PSH, Social History, Family History, Medications, and allergies [facility], and have been updated if relevant.   Review of Systems See HPI Patient reports no  vision/ hearing changes,anorexia, weight change, fever ,adenopathy, persistant / recurrent hoarseness, swallowing issues, chest pain, edema,persistant / recurrent cough, hemoptysis, dyspnea(rest, exertional, paroxysmal nocturnal), gastrointestinal  bleeding (melena, rectal bleeding), abdominal pain, excessive heart  burn, GU symptoms(dysuria, hematuria, pyuria, voiding/incontinence  Issues) syncope, focal weakness, severe memory loss, concerning skin lesions, depression, anxiety, abnormal bruising/bleeding, major joint swelling, breast masses or abnormal vaginal bleeding.       Objective:   Physical Exam  BP 128/70  Pulse 89  Temp(Src) 98 F (36.7 C) (Oral)  Ht 5' 7.75" (1.721 m)  Wt 248 lb (112.492 kg)  BMI 37.98 kg/m2  SpO2 94%  General:  Well-developed,well-nourished,in no acute distress; alert,appropriate and cooperative throughout examination Head:  normocephalic and atraumatic.   Eyes:  vision grossly intact, pupils equal, pupils round, and pupils  reactive to light.   Ears:  R ear normal and L ear normal.   Nose:  no external deformity.   Mouth:  good dentition.   Neck:  No deformities, masses, or tenderness noted. Breasts:  No mass, nodules, thickening, tenderness, bulging, retraction, inflamation, nipple discharge or skin changes noted.   Lungs:  Normal respiratory effort, chest expands symmetrically. Lungs are clear to auscultation, no crackles or wheezes. Heart:  Normal rate and regular rhythm. S1 and S2 normal without gallop, murmur, click, rub or other extra sounds. Abdomen:  Bowel sounds positive,abdomen soft and non-tender without masses, organomegaly or hernias noted. Rectal:  no external abnormalities.   Genitalia:  Pelvic Exam:        External: normal female genitalia without lesions or masses        Vagina: normal without lesions or masses        Cervix: normal without lesions or masses, IUD strings visualized, copious white discharge in vault        Adnexa: normal bimanual exam without masses or fullness        Uterus: normal by palpation        Pap smear: performed Msk:  No deformity or scoliosis noted of thoracic or lumbar spine.   Extremities:  No clubbing, cyanosis, edema, or deformity noted with normal full range of motion of all joints.   Neurologic:  alert & oriented X3 and gait normal.   Skin:  Intact without suspicious lesions or rashes Cervical Nodes:  No lymphadenopathy noted Axillary Nodes:  No palpable lymphadenopathy Psych:  Cognition and judgment appear intact. Alert and cooperative with normal attention span and concentration. No apparent delusions, illusions, hallucinations       Assessment & Plan:

## 2014-03-12 ENCOUNTER — Encounter: Payer: Self-pay | Admitting: Family Medicine

## 2014-03-12 LAB — CERVICOVAGINAL ANCILLARY ONLY
Bacterial vaginitis: POSITIVE — AB
CANDIDA VAGINITIS: NEGATIVE

## 2014-03-13 ENCOUNTER — Ambulatory Visit: Payer: Self-pay | Admitting: Family Medicine

## 2014-03-13 LAB — LIPID PANEL
CHOL/HDL RATIO: 4.9 ratio — AB (ref 0.0–4.4)
Cholesterol, Total: 181 mg/dL (ref 100–199)
HDL: 37 mg/dL — ABNORMAL LOW (ref >39)
LDL CALC: 117 mg/dL — AB (ref 0–99)
TRIGLYCERIDES: 135 mg/dL (ref 0–149)
VLDL Cholesterol Cal: 27 mg/dL (ref 5–40)

## 2014-03-13 LAB — COMPREHENSIVE METABOLIC PANEL
ALK PHOS: 87 IU/L (ref 39–117)
ALT: 16 IU/L (ref 0–32)
AST: 18 IU/L (ref 0–40)
Albumin/Globulin Ratio: 1.7 (ref 1.1–2.5)
Albumin: 4.4 g/dL (ref 3.5–5.5)
BUN / CREAT RATIO: 17 (ref 9–23)
BUN: 14 mg/dL (ref 6–24)
CO2: 21 mmol/L (ref 18–29)
Calcium: 9.3 mg/dL (ref 8.7–10.2)
Chloride: 101 mmol/L (ref 97–108)
Creatinine, Ser: 0.83 mg/dL (ref 0.57–1.00)
GFR calc Af Amer: 100 mL/min/{1.73_m2} (ref >59)
GFR calc non Af Amer: 87 mL/min/{1.73_m2} (ref >59)
GLOBULIN, TOTAL: 2.6 g/dL (ref 1.5–4.5)
Glucose: 100 mg/dL — ABNORMAL HIGH (ref 65–99)
POTASSIUM: 4.6 mmol/L (ref 3.5–5.2)
Sodium: 142 mmol/L (ref 134–144)
Total Bilirubin: 0.3 mg/dL (ref 0.0–1.2)
Total Protein: 7 g/dL (ref 6.0–8.5)

## 2014-03-13 LAB — CBC WITH DIFFERENTIAL/PLATELET
BASOS ABS: 0.1 10*3/uL (ref 0.0–0.2)
Basos: 1 %
Eos: 2 %
Eosinophils Absolute: 0.1 10*3/uL (ref 0.0–0.4)
HEMATOCRIT: 41 % (ref 34.0–46.6)
Hemoglobin: 13.7 g/dL (ref 11.1–15.9)
Immature Grans (Abs): 0 10*3/uL (ref 0.0–0.1)
Immature Granulocytes: 0 %
LYMPHS ABS: 1 10*3/uL (ref 0.7–3.1)
Lymphs: 21 %
MCH: 29 pg (ref 26.6–33.0)
MCHC: 33.4 g/dL (ref 31.5–35.7)
MCV: 87 fL (ref 79–97)
MONOS ABS: 0.4 10*3/uL (ref 0.1–0.9)
Monocytes: 8 %
Neutrophils Absolute: 3.4 10*3/uL (ref 1.4–7.0)
Neutrophils Relative %: 68 %
RBC: 4.72 x10E6/uL (ref 3.77–5.28)
RDW: 13.4 % (ref 12.3–15.4)
WBC: 4.9 10*3/uL (ref 3.4–10.8)

## 2014-03-13 LAB — TSH: TSH: 2.4 u[IU]/mL (ref 0.450–4.500)

## 2014-03-13 LAB — VITAMIN B12: Vitamin B-12: 523 pg/mL (ref 211–946)

## 2014-03-13 LAB — HIV ANTIBODY (ROUTINE TESTING W REFLEX)
HIV 1/HIV 2 AB: NONREACTIVE
HIV 1/O/2 Abs-Index Value: <1 (ref <1.00)

## 2014-03-13 LAB — VITAMIN D 25 HYDROXY (VIT D DEFICIENCY, FRACTURES): VIT D 25 HYDROXY: 15.5 ng/mL — AB (ref 30.0–100.0)

## 2014-03-13 LAB — RPR: RPR: NONREACTIVE

## 2014-03-14 ENCOUNTER — Other Ambulatory Visit: Payer: Self-pay | Admitting: *Deleted

## 2014-03-14 ENCOUNTER — Encounter: Payer: Self-pay | Admitting: Family Medicine

## 2014-03-14 DIAGNOSIS — E559 Vitamin D deficiency, unspecified: Secondary | ICD-10-CM

## 2014-03-14 MED ORDER — VITAMIN D (ERGOCALCIFEROL) 1.25 MG (50000 UNIT) PO CAPS: 50000.0000 [IU] | ORAL_CAPSULE | ORAL | Status: DC

## 2014-03-17 LAB — CERVICOVAGINAL ANCILLARY ONLY: Herpes: NEGATIVE

## 2014-03-18 ENCOUNTER — Telehealth: Payer: Self-pay | Admitting: *Deleted

## 2014-03-18 NOTE — Telephone Encounter (Signed)
Patient left a voicemail that she has gotten a call from the radiology department that she needs to go back for additional views and possible U/S. Patient has concerns and wants a call back as to what they are looking for?

## 2014-03-18 NOTE — Telephone Encounter (Signed)
Returned patient's call.  Discussed this with pt- reassurance provided. She will proceed with additional views and ultrasound on Thursday.

## 2014-03-20 ENCOUNTER — Encounter: Payer: Self-pay | Admitting: Family Medicine

## 2014-03-20 ENCOUNTER — Ambulatory Visit: Payer: Self-pay | Admitting: Family Medicine

## 2014-04-24 ENCOUNTER — Ambulatory Visit (INDEPENDENT_AMBULATORY_CARE_PROVIDER_SITE_OTHER): Payer: 59 | Admitting: Internal Medicine

## 2014-04-24 ENCOUNTER — Encounter: Payer: Self-pay | Admitting: Internal Medicine

## 2014-04-24 VITALS — BP 106/74 | HR 89 | Temp 98.1°F | Wt 248.5 lb

## 2014-04-24 DIAGNOSIS — S30860A Insect bite (nonvenomous) of lower back and pelvis, initial encounter: Secondary | ICD-10-CM

## 2014-04-24 DIAGNOSIS — W57XXXA Bitten or stung by nonvenomous insect and other nonvenomous arthropods, initial encounter: Principal | ICD-10-CM

## 2014-04-24 MED ORDER — DOXYCYCLINE HYCLATE 100 MG PO TABS: 100.0000 mg | ORAL_TABLET | Freq: Two times a day (BID) | ORAL | Status: DC

## 2014-04-24 NOTE — Progress Notes (Signed)
Subjective:    Patient ID: Crystal CooperWendolyn M Johansson, female    DOB: 03-10-1971, 43 y.o.   MRN: [id number]  HPI  Pt presents to the clinic today with c/o a tick bite. This occurred possibly yesterday. She noticed the tick today but reports she has not felt well since yesterday. She c/o fatigue and nausea.  Review of Systems      Past Medical History  Diagnosis Date  . Anxiety   . Depression   . Anxiety     Current Outpatient Prescriptions  Medication Sig Dispense Refill  . cetirizine (ZYRTEC) 10 MG tablet Take 10 mg by mouth daily.      . clonazePAM (KLONOPIN) 0.5 MG tablet Take 1 tablet (0.5 mg total) by mouth 3 (three) times daily as needed. Take as needed for anxiety  90 tablet  0  . ibuprofen (ADVIL,MOTRIN) 200 MG tablet Take 200 mg by mouth every 6 (six) hours as needed.      Marland Kitchen. levonorgestrel (MIRENA) 20 MCG/24HR IUD 1 each by Intrauterine route once.      . sertraline (ZOLOFT) 100 MG tablet Take 1 tablet (100 mg total) by mouth daily.  90 tablet  0  . Vitamin D, Ergocalciferol, (DRISDOL) 50000 UNITS CAPS capsule Take 1 capsule (50,000 Units total) by mouth every 7 (seven) days.  6 capsule  0   No current facility-administered medications for this visit.    No Known Allergies  Family History  Problem Relation Age of Onset  . Cancer Daughter     osteosarcoma of leg  . Cancer Paternal Grandfather     colon    History   Social History  . Marital Status: Married    Spouse Name: N/A    Number of Children: 3  . 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 Smoking status: Never Smoker   . Smokeless tobacco: Never Used  . Alcohol Use: 1.2 oz/week    2 Glasses of wine per week     Comment: Very rare  . Drug Use: No  . Sexual Activity: No   Other Topics Concern  . Not on file   Social History Narrative   Daughter with osteosarcoma of leg.     Constitutional: Pt reports fatigue. Denies fever, malaise,  fatigue, headache or abrupt weight changes.  Skin: Pt reports tick bite. Denies  rashes, lesions or ulcercations.    No other specific complaints in a complete review of systems (except as listed in HPI above).  Objective:   Physical Exam   BP 106/74  Pulse 89  Temp(Src) 98.1 F (36.7 C) (Oral)  Wt 248 lb 8 oz (112.719 kg)  SpO2 98% Wt Readings from Last 3 Encounters:  04/24/14 248 lb 8 oz (112.719 kg)  03/10/14 248 lb (112.492 kg)  02/14/14 249 lb (112.946 kg)    General: Appears her stated age, well developed, well nourished in NAD. Skin: Warm, dry and intact. Small area of redness noted around tick bite. No erythema migrans. Cardiovascular: Normal rate and rhythm. S1,S2 noted.  No murmur, rubs or gallops noted. No JVD or BLE edema. No carotid bruits noted. Pulmonary/Chest: Normal effort and positive vesicular breath sounds. No respiratory distress. No wheezes, rales or ronchi noted.  Neurological: Alert and oriented. Cranial nerves grossly intact.   BMET    Component Value Date/Time   NA 142 03/10/2014 0944   NA 141 11/10/2011 2055  K 4.6 03/10/2014 0944   CL 101 03/10/2014 0944   CO2 21 03/10/2014 0944   GLUCOSE 100* 03/10/2014 0944   GLUCOSE 107* 11/10/2011 2055   BUN 14 03/10/2014 0944   BUN 11 11/10/2011 2055   CREATININE 0.83 03/10/2014 0944   CALCIUM 9.3 03/10/2014 0944   GFRNONAA 87 03/10/2014 0944   GFRAA 100 03/10/2014 0944    Lipid Panel     Component Value Date/Time   TRIG 135 03/10/2014 0944   HDL 37* 03/10/2014 0944   CHOLHDL 4.9* 03/10/2014 0944   LDLCALC 117* 03/10/2014 0944    CBC    Component Value Date/Time   WBC 4.9 03/10/2014 0944   WBC 8.7 11/10/2011 2039   RBC 4.72 03/10/2014 0944   RBC 4.37 11/10/2011 2039   HGB 13.7 03/10/2014 0944   HCT 41.0 03/10/2014 0944   PLT 323 11/10/2011 2039   MCV 87 03/10/2014 0944   MCH 29.0 03/10/2014 0944   MCH 29.1 11/10/2011 2039   MCHC 33.4 03/10/2014 0944   MCHC 33.4 11/10/2011 2039   RDW 13.4 03/10/2014 0944   RDW 12.5 11/10/2011 2039     LYMPHSABS 1.0 03/10/2014 0944   EOSABS 0.1 03/10/2014 0944   BASOSABS 0.1 03/10/2014 0944    Hgb A1C No results found for this basename: HGBA1C        Assessment & Plan:   Tick bite:  eRx for Doxycycline BID x 10 days Tick was completely pulled out Covered with triple antibiotic ointment and bandaid  RTC as needed or if symptoms persist or worsen

## 2014-04-24 NOTE — Patient Instructions (Addendum)
Tick Bite Information Ticks are insects that attach themselves to the skin and draw blood for food. There are various types of ticks. Common types include wood ticks and deer ticks. Most ticks live in shrubs and grassy areas. Ticks can climb onto your body when you make contact with leaves or grass where the tick is waiting. The most common places on the body for ticks to attach themselves are the scalp, neck, armpits, waist, and groin. Most tick bites are harmless, but sometimes ticks carry germs that cause diseases. These germs can be spread to a person during the tick's feeding process. The chance of a disease spreading through a tick bite depends on:   The type of tick.  Time of year.   How long the tick is attached.   Geographic location.  HOW CAN YOU PREVENT TICK BITES? Take these steps to help prevent tick bites when you are outdoors:  Wear protective clothing. Long sleeves and long pants are best.   Wear white clothes so you can see ticks more easily.  Tuck your pant legs into your socks.   If walking on a trail, stay in the middle of the trail to avoid brushing against bushes.  Avoid walking through areas with long grass.  Put insect repellent on all exposed skin and along boot tops, pant legs, and sleeve cuffs.   Check clothing, hair, and skin repeatedly and before going inside.   Brush off any ticks that are not attached.  Take a shower or bath as soon as possible after being outdoors.  WHAT IS THE PROPER WAY TO REMOVE A TICK? Ticks should be removed as soon as possible to help prevent diseases caused by tick bites. 1. If latex gloves are available, put them on before trying to remove a tick.  2. Using fine-point tweezers, grasp the tick as close to the skin as possible. You may also use curved forceps or a tick removal tool. Grasp the tick as close to its head as possible. Avoid grasping the tick on its body. 3. Pull gently with steady upward pressure until  the tick lets go. Do not twist the tick or jerk it suddenly. This may break off the tick's head or mouth parts. 4. Do not squeeze or crush the tick's body. This could force disease-carrying fluids from the tick into your body.  5. After the tick is removed, wash the bite area and your hands with soap and water or other disinfectant such as alcohol. 6. Apply a small amount of antiseptic cream or ointment to the bite site.  7. Wash and disinfect any instruments that were used.  Do not try to remove a tick by applying a hot match, petroleum jelly, or fingernail polish to the tick. These methods do not work and may increase the chances of disease being spread from the tick bite.  WHEN SHOULD YOU SEEK MEDICAL CARE? Contact your health care provider if you are unable to remove a tick from your skin or if a part of the tick breaks off and is stuck in the skin.  After a tick bite, you need to be aware of signs and symptoms that could be related to diseases spread by ticks. Contact your health care provider if you develop any of the following in the days or weeks after the tick bite:  Unexplained fever.  Rash. A circular rash that appears days or weeks after the tick bite may indicate the possibility of Lyme disease. The rash may resemble   a target with a bull's-eye and may occur at a different part of your body than the tick bite.  Redness and swelling in the area of the tick bite.   Tender, swollen lymph glands.   Diarrhea.   Weight loss.   Cough.   Fatigue.   Muscle, joint, or bone pain.   Abdominal pain.   Headache.   Lethargy or a change in your level of consciousness.  Difficulty walking or moving your legs.   Numbness in the legs.   Paralysis.  Shortness of breath.   Confusion.   Repeated vomiting.  Document Released: 11/18/2000 Document Revised: 09/11/2013 Document Reviewed: 05/01/2013 [employer] Patient Information 2014 [employer].  

## 2014-04-24 NOTE — Progress Notes (Signed)
Pre visit review using our clinic review tool, if applicable. No additional management support is needed unless otherwise documented below in the visit note. 

## 2014-05-23 ENCOUNTER — Other Ambulatory Visit (INDEPENDENT_AMBULATORY_CARE_PROVIDER_SITE_OTHER): Payer: 59

## 2014-05-23 DIAGNOSIS — E550 Rickets, active: Secondary | ICD-10-CM

## 2014-05-23 DIAGNOSIS — E559 Vitamin D deficiency, unspecified: Secondary | ICD-10-CM

## 2014-05-24 LAB — VITAMIN D 25 HYDROXY (VIT D DEFICIENCY, FRACTURES): Vit D, 25-Hydroxy: 19.3 ng/mL — ABNORMAL LOW (ref 30.0–100.0)

## 2014-05-28 MED ORDER — VITAMIN D (ERGOCALCIFEROL) 1.25 MG (50000 UNIT) PO CAPS: 50000.0000 [IU] | ORAL_CAPSULE | ORAL | Status: DC

## 2014-05-28 NOTE — Addendum Note (Signed)
Addended by: Desmond DikeKNIGHT, Prerana Strayer H on: 05/28/2014 12:15 PM   Modules accepted: Orders

## 2014-06-11 ENCOUNTER — Ambulatory Visit (INDEPENDENT_AMBULATORY_CARE_PROVIDER_SITE_OTHER): Payer: 59 | Admitting: Family Medicine

## 2014-06-11 ENCOUNTER — Encounter: Payer: Self-pay | Admitting: Family Medicine

## 2014-06-11 VITALS — BP 106/80 | HR 91 | Temp 98.4°F | Ht 67.75 in | Wt 250.2 lb

## 2014-06-11 DIAGNOSIS — M545 Low back pain, unspecified: Secondary | ICD-10-CM

## 2014-06-11 MED ORDER — TIZANIDINE HCL 4 MG PO TABS: 4.0000 mg | ORAL_TABLET | Freq: Every evening | ORAL | Status: DC

## 2014-06-11 MED ORDER — DICLOFENAC SODIUM 75 MG PO TBEC: 75.0000 mg | DELAYED_RELEASE_TABLET | Freq: Two times a day (BID) | ORAL | Status: DC

## 2014-06-11 NOTE — Progress Notes (Signed)
Pre visit review using our clinic review tool, if applicable. No additional management support is needed unless otherwise documented below in the visit note. 

## 2014-06-11 NOTE — Progress Notes (Signed)
[address] [place] [address] Phone: [phone] Fax: [phone]  Patient ID: Crystal Houston MRN: [id number], DOB: 12/11/1970, 43 y.o. Date of Encounter: 06/11/2014  Primary Physician:  Ruthe Mannanalia Aron, MD   Chief Complaint: Back Pain  Subjective:   History of Present Illness:  Crystal Houston is a 43 y.o. very pleasant female patient who presents with the following: Back Pain  ongoing for approximately: 3-4 weeks The patient has had back pain before. The back pain is localized into the lumbar spine area. They also describe no radiculopathy.  The past several weeks, it has gotten worse, when going to bed Would start hurting so bad in her lower back. Then not but only on one side. Sleeping with several pillows. Got up at around three in the morning. Used some lidoderm patches. Had some fibromyalgia.   With sleep. 3-4 weeks.   No numbness or tingling. No bowel or bladder incontinence. No focal weakness. 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  Review of Systems  GEN: No fevers, chills. Nontoxic. Primarily MSK c/o today. MSK: Detailed in the HPI GI: tolerating PO intake without difficulty Neuro: As above  Otherwise the pertinent positives of the ROS are noted above.     Objective:   Physical Exam Filed Vitals:   06/11/14 1601  BP: 106/80  Pulse: 91  Temp: 98.4 F (36.9 C)  TempSrc: Oral  Height: 5' 7.75" (1.721 m)  Weight: 250 lb 4 oz (113.513 kg)    Gen: Well-developed,well-nourished,in no acute distress; alert,appropriate and cooperative throughout examination HEENT: Normocephalic and atraumatic without obvious abnormalities.  Ears, externally no deformities Pulm: Breathing comfortably in no  respiratory distress Range of motion at  the waist: Flexion, rotation and lateral bending: full ROM  No echymosis or edema Rises to examination table with no difficulty Gait: minimally antalgic  Inspection/Deformity: No abnormality Paraspinus T:  TTP b  B Ankle Dorsiflexion (L5,4): 5/5 B Great Toe Dorsiflexion (L5,4): 5/5 Heel Walk (L5): WNL Toe Walk (S1): WNL Rise/Squat (L4): WNL, mild pain  SENSORY B Medial Foot (L4): WNL B Dorsum (L5): WNL B Lateral (S1): WNL Light Touch: WNL Pinprick: WNL  REFLEXES Knee (L4): 2+ Ankle (S1): 2+  B SLR, seated: neg B SLR, supine: neg B FABER: neg B Reverse FABER: neg B Greater Troch: NT B Log Roll: neg B Stork: NT B Sciatic Notch: NT  Radiology: No results found.  Assessment & Plan:   Back pain  I reviewed with the patient the anatomical structures involved.  Conservative algorithms for acute back pain generally begin with the following: NSAIDS, Muscle Relaxants, Mild pain medication I reviewed with the patient a handout from [employer] [facility] and [facility] regarding their condition and a set of home exercises.    Start with medications, core rehab, and progress from there following low back pain algorithm. No red flags are present.  New Prescriptions   DICLOFENAC (VOLTAREN) 75 MG EC TABLET    Take 1 tablet (75 mg total) by mouth 2 (two) times daily.   TIZANIDINE (ZANAFLEX) 4 MG TABLET    Take 1 tablet (4 mg total) by mouth Nightly.   Modified Medications   No medications on file   No orders of the defined types were placed in this encounter.   Follow-up: No  Follow-up on file. Unless noted above, the patient is to follow-up if symptoms worsen. Red flags were reviewed with the patient.  Signed,  Elpidio GaleaSpencer T. Tria Noguera, MD, CAQ Sports Medicine   Back pain:  Suggest moist heat: like a low level heating pad You can also use a water bottle Or a warm moist towel  A warm bath can often help Or a warm  shower.  Massage is very helpful with back pain associated with muscle pain. If you have someone who lives with you who could give you a back massage, it would be a good idea.  Discontinued Medications   DOXYCYCLINE (VIBRA-TABS) 100 MG TABLET    Take 1 tablet (100 mg total) by mouth 2 (two) times daily.   Current Medications at Discharge:   Medication List       This list is accurate as of: 06/11/14 11:59 PM.  Always use your most recent med list.               cetirizine 10 MG tablet  Commonly known as:  ZYRTEC  Take 10 mg by mouth as needed.     clonazePAM 0.5 MG tablet  Commonly known as:  KLONOPIN  Take 1 tablet (0.5 mg total) by mouth 3 (three) times daily as needed. Take as needed for anxiety     diclofenac 75 MG EC tablet  Commonly known as:  VOLTAREN  Take 1 tablet (75 mg total) by mouth 2 (two) times daily.     ibuprofen 200 MG tablet  Commonly known as:  ADVIL,MOTRIN  Take 200 mg by mouth every 6 (six) hours as needed.     levonorgestrel 20 MCG/24HR IUD  Commonly known as:  MIRENA  1 each by Intrauterine route once.     sertraline 100 MG tablet  Commonly known as:  ZOLOFT  Take 1 tablet (100 mg total) by mouth daily.     tiZANidine 4 MG tablet  Commonly known as:  ZANAFLEX  Take 1 tablet (4 mg total) by mouth Nightly.     Vitamin D (Ergocalciferol) 50000 UNITS Caps capsule  Commonly known as:  DRISDOL  Take 1 capsule (50,000 Units total) by mouth every 7 (seven) days.

## 2014-07-21 ENCOUNTER — Encounter: Payer: Self-pay | Admitting: Family Medicine

## 2014-07-21 ENCOUNTER — Ambulatory Visit (INDEPENDENT_AMBULATORY_CARE_PROVIDER_SITE_OTHER): Payer: 59 | Admitting: Family Medicine

## 2014-07-21 VITALS — BP 118/68 | HR 80 | Temp 98.1°F | Ht 67.0 in | Wt 247.5 lb

## 2014-07-21 DIAGNOSIS — F3289 Other specified depressive episodes: Secondary | ICD-10-CM

## 2014-07-21 DIAGNOSIS — F4321 Adjustment disorder with depressed mood: Secondary | ICD-10-CM

## 2014-07-21 DIAGNOSIS — F329 Major depressive disorder, single episode, unspecified: Secondary | ICD-10-CM

## 2014-07-21 DIAGNOSIS — E669 Obesity, unspecified: Secondary | ICD-10-CM

## 2014-07-21 DIAGNOSIS — F4323 Adjustment disorder with mixed anxiety and depressed mood: Secondary | ICD-10-CM

## 2014-07-21 DIAGNOSIS — Z634 Disappearance and death of family member: Secondary | ICD-10-CM

## 2014-07-21 NOTE — Patient Instructions (Signed)
Great to see you. I am so proud of you. Come see me in 1 month.

## 2014-07-21 NOTE — Progress Notes (Signed)
Subjective:   Patient ID: Crystal Houston, female    DOB: Sep 25, 1971, 43 y.o.   MRN: [id number]  Crystal Houston is a pleasant 43 y.o. year old female who presents to clinic today with Follow-up  on 07/21/2014  HPI: Anxiety-  Wastaking Zoloft 100 mg daily.   Stopped taking Zoloft 4 weeks ago.   Still using klonipin as needed- not too frequently, has only taken it once in past month.     Feels she is coping with daughter's death the best she can and did not feel she needed medication to cope anymore.   Also felt it was working against her weight loss efforts.  Obesity- started going to [employer] (herbal) last week- year long program.  She is following up once a week with their nutritionist-low fat diet per pt. Has already lost 3 pounds and feels better. Wt Readings from Last 3 Encounters:  07/21/14 247 lb 8 oz (112.265 kg)  06/11/14 250 lb 4 oz (113.513 kg)  04/24/14 248 lb 8 oz (112.719 kg)   Current Outpatient Prescriptions on File Prior to Visit  Medication Sig Dispense Refill  . cetirizine (ZYRTEC) 10 MG tablet Take 10 mg by mouth as needed.       . clonazePAM (KLONOPIN) 0.5 MG tablet Take 1 tablet (0.5 mg total) by mouth 3 (three) times daily as needed. Take as needed for anxiety  90 tablet  0  . diclofenac (VOLTAREN) 75 MG EC tablet Take 1 tablet (75 mg total) by mouth 2 (two) times daily.  60 tablet  3  . ibuprofen (ADVIL,MOTRIN) 200 MG tablet Take 200 mg by mouth every 6 (six) hours as needed.      Marland Kitchen levonorgestrel (MIRENA) 20 MCG/24HR IUD 1 each by Intrauterine route once.      Marland Kitchen tiZANidine (ZANAFLEX) 4 MG tablet Take 1 tablet (4 mg total) by mouth Nightly.  30 tablet  2  . Vitamin D, Ergocalciferol, (DRISDOL) 50000 UNITS CAPS capsule Take 1 capsule (50,000 Units total) by mouth every 7 (seven) days.  6 capsule  0   No current facility-administered medications on file prior to visit.    No Known Allergies  Past Medical History  Diagnosis Date  . Anxiety     . Depression     Past Surgical History  Procedure Laterality Date  . Fracture surgery    . Orif ankle fracture  11/10/2011    Procedure: OPEN REDUCTION INTERNAL FIXATION (ORIF) ANKLE FRACTURE;  Surgeon: Loanne Drilling;  Location: [facility] OR;  Service: Orthopedics;  Laterality: Right;  with screw applicaton  . I&d extremity  11/10/2011    Procedure: IRRIGATION AND DEBRIDEMENT EXTREMITY;  Surgeon: Loanne Drilling;  Location: [facility] OR;  Service: Orthopedics;  Laterality: Right;    Family History  Problem Relation Age of Onset  . Cancer Daughter     osteosarcoma of leg  . Cancer Paternal Grandfather     colon    History   Social History  . Marital Status: Married    Spouse Name: N/A    Number of Children: 3  . 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 Smoking status: Never Smoker   . Smokeless tobacco: Never Used  . Alcohol Use: 1.2 oz/week    2 Glasses of wine per week     Comment: Very rare  . Drug Use: No  . Sexual Activity: No  Other Topics Concern  . Not on file   Social History Narrative   Daughter with osteosarcoma of leg.   The PMH, PSH, Social History, Family History, Medications, and allergies [facility], and have been updated if relevant.  Review of Systems    see HPI No CP or SOB with exertion No syncope Denies feeling depressed or anxious No SI or HI Objective:    BP 118/68  Pulse 80  Temp(Src) 98.1 F (36.7 C) (Oral)  Ht 5\' 7"  (1.702 m)  Wt 247 lb 8 oz (112.265 kg)  BMI 38.75 kg/m2  SpO2 97%   Physical Exam   General:  Well-developed,well-nourished,in no acute distress; alert,appropriate and cooperative throughout examination Head:  normocephalic and atraumatic.   Psych:  Cognition and judgment appear intact. Alert and cooperative with normal attention span and concentration. No apparent delusions, illusions, hallucinations       Assessment & Plan:    DEPRESSION  Grief at loss of child No Follow-up on file.

## 2014-07-21 NOTE — Assessment & Plan Note (Signed)
>  25 minutes spent in face to face time with patient, >50% spent in counselling or coordination of care Tearful when she talks about her daughter but very appropriate.  Feels coping well off SSRI.  Will not restart.  Ok to use prn klonopin (only used it once in 4 weeks).

## 2014-07-21 NOTE — Assessment & Plan Note (Signed)
Advised her to be cautious with herbal supplements.  Continue exercise and diet. She will follow up with me in one month.

## 2014-07-21 NOTE — Progress Notes (Signed)
Pre visit review using our clinic review tool, if applicable. No additional management support is needed unless otherwise documented below in the visit note. 

## 2014-07-30 ENCOUNTER — Other Ambulatory Visit: Payer: Self-pay

## 2014-09-01 ENCOUNTER — Telehealth: Payer: Self-pay | Admitting: *Deleted

## 2014-09-01 MED ORDER — VALACYCLOVIR HCL 1 G PO TABS: 1000.0000 mg | ORAL_TABLET | Freq: Two times a day (BID) | ORAL | Status: DC

## 2014-09-01 NOTE — Telephone Encounter (Signed)
Pt requesting medication refill of valacyclovir. Med on Hx list only. pls advise

## 2014-09-01 NOTE — Telephone Encounter (Signed)
eRx sent

## 2014-10-09 ENCOUNTER — Encounter: Payer: Self-pay | Admitting: Family Medicine

## 2014-10-10 ENCOUNTER — Other Ambulatory Visit: Payer: Self-pay | Admitting: Family Medicine

## 2014-10-10 MED ORDER — SERTRALINE HCL 50 MG PO TABS: ORAL_TABLET | ORAL | Status: DC

## 2014-12-01 ENCOUNTER — Ambulatory Visit (INDEPENDENT_AMBULATORY_CARE_PROVIDER_SITE_OTHER)
Admission: RE | Admit: 2014-12-01 | Discharge: 2014-12-01 | Disposition: A | Discharge status: Home / Self Care | Payer: 59 | Source: Ambulatory Visit | Attending: Family Medicine | Admitting: Family Medicine

## 2014-12-01 ENCOUNTER — Ambulatory Visit (INDEPENDENT_AMBULATORY_CARE_PROVIDER_SITE_OTHER): Payer: 59 | Admitting: Family Medicine

## 2014-12-01 ENCOUNTER — Encounter: Payer: Self-pay | Admitting: Family Medicine

## 2014-12-01 VITALS — BP 116/72 | HR 88 | Temp 97.9°F | Wt 248.5 lb

## 2014-12-01 DIAGNOSIS — M5441 Lumbago with sciatica, right side: Secondary | ICD-10-CM

## 2014-12-01 MED ORDER — PREDNISONE 20 MG PO TABS: ORAL_TABLET | ORAL | Status: DC

## 2014-12-01 MED ORDER — PREGABALIN 75 MG PO CAPS: 75.0000 mg | ORAL_CAPSULE | Freq: Two times a day (BID) | ORAL | Status: DC

## 2014-12-01 MED ORDER — TIZANIDINE HCL 4 MG PO TABS: 4.0000 mg | ORAL_TABLET | Freq: Every evening | ORAL | Status: DC

## 2014-12-01 NOTE — Progress Notes (Signed)
[address] [place] [address] Phone: [phone] Fax: [phone]  Patient ID: Crystal Houston MRN: [id number], DOB: 02-03-1971, 43 y.o. Date of Encounter: 12/01/2014  Primary Physician:  Ruthe Mannan, MD   Chief Complaint: Back Pain  Subjective:   History of Present Illness:  Crystal Houston is a 43 y.o. very pleasant female patient who presents with the following: Back Pain  Back pain: pleasant patient who I saw approximately 6 months ago has been intermittently having back pain for approximately 7 months. Prior then, she has had back pain previous to this for least last 2 or 3 years intermittently. She has had some chiropractic manipulations a few years ago which seemed to help quite a bit. Over the last 2 or 3 months her symptoms seem to have changed and she is now having radicular sensations down her right leg with some numbness and tingling. She has not have any frank numbness or weakness. She is not having any bowel or bladder incontinence.  She did have some relief of symptoms when she was taking some oral Voltaren as well as some Zanaflex.  She has never done any physical therapy for her back. She has never done any acupuncture.  Intermittently has had back pain for 7 months.  2 children in bed with her, but now none, and having a lot of pain.   Last night, got up between 2 or 3 and almost in tears. Lower back.  Has also found some tingling in R leg.   Went to the chiropractor. Went to [employer] for over a year.   ? R leg is longer.  Pain with laying down.   06/11/2014 Last OV with Hannah Beat, MD  ongoing for approximately: 3-4 weeks The patient has had back pain before. The back pain is localized into the lumbar spine area. They also describe no radiculopathy.  The past several weeks, it has gotten worse, when going to bed Would start hurting so bad in her lower back. Then not but only on one side. Sleeping with several pillows. Got up at around  three in the morning. Used some lidoderm patches. Had some fibromyalgia.   With sleep. 3-4 weeks.   No numbness or tingling. No bowel or bladder incontinence. No focal weakness. 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  Review of Systems  GEN: No fevers, chills. Nontoxic. Primarily MSK c/o today. MSK: Detailed in the HPI GI: tolerating PO intake without difficulty Neuro: As above  Otherwise the pertinent positives of the ROS are noted above.     Objective:   Physical Exam Filed Vitals:   12/01/14 1551  BP: 116/72  Pulse: 88  Temp: 97.9 F (36.6 C)  TempSrc: Oral  Weight: 248 lb 8 oz (112.719 kg)    Gen: Well-developed,well-nourished,in no acute distress; alert,appropriate and cooperative throughout examination HEENT: Normocephalic and atraumatic without obvious abnormalities.  Ears, externally no deformities Pulm: Breathing comfortably in no respiratory distress Range of motion at  the waist: Flexion, rotation and lateral bending: full ROM  No echymosis or edema Rises to examination table with no difficulty Gait: minimally antalgic  Inspection/Deformity: No abnormality Paraspinus T:  TTP b  B Ankle Dorsiflexion (L5,4): 5/5 B Great Toe Dorsiflexion (L5,4): 5/5 Heel Walk (L5): WNL Toe Walk (S1): WNL Rise/Squat (L4):  WNL, mild pain On the left side, both dorsiflexion of the ankle as well as toe elevation or mildly decreased, but this is long-standing from an old injury many decades ago.  SENSORY B Medial Foot (L4): WNL B Dorsum (L5): WNL B Lateral (S1): WNL Light Touch: WNL Pinprick: WNL Patient does have one area at the point of her right sided lateral incision on her lower extremity on the right which is  mildly numb, but this is long-standing.  REFLEXES Knee (L4): 2+ Ankle (S1): 2+  B SLR, seated: neg B SLR, supine: neg B FABER: neg B Reverse FABER: neg B Greater Troch: NT B Log Roll: neg B Stork: NT B Sciatic Notch: NT  Radiology: Dg Lumbar Spine Complete  12/01/2014   CLINICAL DATA:  Right-sided low back pain with sciatic symptoms  EXAM: LUMBAR SPINE - COMPLETE 4+ VIEW  COMPARISON:  Abdominal flat plate of March 26, 2012.  FINDINGS: The lumbar vertebral bodies are preserved in height. The intervertebral disc space heights are well maintained. There is no spondylolisthesis. No pars defects are demonstrated. The pedicles and transverse processes are unremarkable. The observed portions of the sacrum are normal.  IMPRESSION: There is no acute or significant chronic bony abnormality of the lumbar spine.   Electronically Signed   By: David  SwazilandJordan   On: 12/01/2014 17:02     Assessment & Plan:   Right-sided low back pain with right-sided sciatica - Plan: DG Lumbar Spine Complete  By history, this is radicular pain with neuropathic sensations, more likely caused by nerve being pinched on the right side. I think it is reasonable to treat this conservatively initially, we will do a 10 day prednisone taper, and I am going to place her on some neuropathic pain medication.  She did well on Lyrica him about a decade ago, and she has failed gabapentin previously.  New Prescriptions   PREDNISONE (DELTASONE) 20 MG TABLET    2 tabs po daily for 5 days, then 1 po daily   PREGABALIN (LYRICA) 75 MG CAPSULE    Take 1 capsule (75 mg total) by mouth 2 (two) times daily.   Modified Medications   Modified Medication Previous Medication   TIZANIDINE (ZANAFLEX) 4 MG TABLET tiZANidine (ZANAFLEX) 4 MG tablet      Take 1 tablet (4 mg total) by mouth Nightly.    Take 1 tablet (4 mg total) by mouth Nightly.   Orders Placed This Encounter  Procedures  . DG Lumbar Spine Complete   Follow-up: No Follow-up on  file. Unless noted above, the patient is to follow-up if symptoms worsen. Red flags were reviewed with the patient.  Signed,  Elpidio GaleaSpencer T. Shaine Mount, MD, CAQ Sports Medicine    Discontinued Medications   VITAMIN D, ERGOCALCIFEROL, (DRISDOL) 50000 UNITS CAPS CAPSULE    Take 1 capsule (50,000 Units total) by mouth every 7 (seven) days.   Current Medications at Discharge:   Medication List       This list is accurate as of: 12/01/14  5:55 PM.  Always use your most recent med list.               cetirizine 10 MG tablet  Commonly known as:  ZYRTEC  Take 10 mg by mouth as needed.     clonazePAM 0.5 MG tablet  Commonly known as:  KLONOPIN  Take 1 tablet (0.5 mg total) by mouth 3 (three) times daily as needed. Take as needed for anxiety     diclofenac 75  MG EC tablet  Commonly known as:  VOLTAREN  Take 1 tablet (75 mg total) by mouth 2 (two) times daily.     ibuprofen 200 MG tablet  Commonly known as:  ADVIL,MOTRIN  Take 200 mg by mouth every 6 (six) hours as needed.     levonorgestrel 20 MCG/24HR IUD  Commonly known as:  MIRENA  1 each by Intrauterine route once.     predniSONE 20 MG tablet  Commonly known as:  DELTASONE  2 tabs po daily for 5 days, then 1 po daily     pregabalin 75 MG capsule  Commonly known as:  LYRICA  Take 1 capsule (75 mg total) by mouth 2 (two) times daily.     sertraline 50 MG tablet  Commonly known as:  ZOLOFT  1 tab by mouth daily.  Ok to increase to 2 tabs by mouth daily in 1-2 weeks if tolerating well     tiZANidine 4 MG tablet  Commonly known as:  ZANAFLEX  Take 1 tablet (4 mg total) by mouth Nightly.

## 2014-12-01 NOTE — Progress Notes (Signed)
Pre visit review using our clinic review tool, if applicable. No additional management support is needed unless otherwise documented below in the visit note. 

## 2014-12-08 ENCOUNTER — Other Ambulatory Visit: Payer: Self-pay | Admitting: *Deleted

## 2014-12-08 MED ORDER — PREGABALIN 75 MG PO CAPS
75.0000 mg | ORAL_CAPSULE | Freq: Two times a day (BID) | ORAL | Status: DC
Start: 2014-12-08 — End: 2015-03-04

## 2014-12-08 NOTE — Telephone Encounter (Signed)
Rx faxed to [facility] [phone].

## 2015-01-15 ENCOUNTER — Other Ambulatory Visit: Payer: Self-pay | Admitting: Dermatology

## 2015-03-03 ENCOUNTER — Encounter: Payer: Self-pay | Admitting: Family Medicine

## 2015-03-03 NOTE — Telephone Encounter (Signed)
Please have the patient follow-up with me.

## 2015-03-04 ENCOUNTER — Encounter: Payer: Self-pay | Admitting: Family Medicine

## 2015-03-04 ENCOUNTER — Ambulatory Visit (INDEPENDENT_AMBULATORY_CARE_PROVIDER_SITE_OTHER): Payer: 59 | Admitting: Family Medicine

## 2015-03-04 VITALS — BP 106/70 | HR 80 | Temp 98.1°F | Ht 67.0 in | Wt 248.5 lb

## 2015-03-04 DIAGNOSIS — M5441 Lumbago with sciatica, right side: Secondary | ICD-10-CM | POA: Diagnosis not present

## 2015-03-04 MED ORDER — PREGABALIN 100 MG PO CAPS
100.0000 mg | ORAL_CAPSULE | Freq: Two times a day (BID) | ORAL | Status: DC
Start: 2015-03-04 — End: 2015-03-05

## 2015-03-04 NOTE — Progress Notes (Signed)
[address] [place] [address] Phone: [phone] Fax: [phone]  Patient ID: Crystal Houston MRN: [id number], DOB: 10-24-1971, 44 y.o. Date of Encounter: 03/04/2015  Primary Physician:  Ruthe Mannan, MD   Chief Complaint: Follow-up  Subjective:   History of Present Illness:  Crystal Houston is a 44 y.o. very pleasant female patient who presents with the following: Back Pain  Thought going ok, but still gets some pain even on Lyrica, and forgot her medication last weekend. Woke up about 2 or 3 AM, really was hurting to breath. Went to [facility], got some lidocaine / [facility] and also got some [facility]. Loaded her back up with the lidocaine. Still was not great. But then the top started to hurt. Then put heat on.   Back where bra is hurting.  B hands are tingling.   Now doing Lyrica only.   12/01/2014 Last OV with Hannah Beat, MD  Back pain: pleasant patient who I saw approximately 6 months ago has been intermittently having back pain for approximately 7 months. Prior then, she has had back pain previous to this for least last 2 or 3 years intermittently. She has had some chiropractic manipulations a few years ago which seemed to help quite a bit. Over the last 2 or 3 months her symptoms seem to have changed and she is now having radicular sensations down her right leg with some numbness and tingling. She has not have any frank numbness or weakness. She is not having any bowel or bladder incontinence.  She did have some relief of symptoms when she was taking some oral Voltaren as well as some Zanaflex.  She has never done any physical therapy for her back. She has never done any acupuncture.  Intermittently has had back pain for 7 months.  2 children in bed with her, but now none, and having a lot of pain.   Last night, got up between 2 or 3 and almost in tears. Lower back.  Has also found some tingling in R leg.   Went to the chiropractor. Went to [employer] for  over a year.   ? R leg is longer.  Pain with laying down.   06/11/2014 Last OV with Hannah Beat, MD  ongoing for approximately: 3-4 weeks The patient has had back pain before. The back pain is localized into the lumbar spine area. They also describe no radiculopathy.  The past several weeks, it has gotten worse, when going to bed Would start hurting so bad in her lower back. Then not but only on one side. Sleeping with several pillows. Got up at around three in the morning. Used some lidoderm patches. Had some fibromyalgia.   With sleep. 3-4 weeks.   No numbness or tingling. No bowel or bladder incontinence. No focal weakness. 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  Review of Systems  GEN: No fevers, chills. Nontoxic. Primarily MSK c/o today. MSK: Detailed in the HPI GI: tolerating PO intake without difficulty Neuro: As above  Otherwise the pertinent positives of the ROS are noted above.     Objective:   Physical Exam Filed Vitals:   03/04/15 1102  BP: 106/70  Pulse: 80  Temp: 98.1 F (36.7 C)  TempSrc: Oral  Height:  (1.702 m)  Weight:  248 lb 8 oz (112.719 kg)    Gen: Well-developed,well-nourished,in no acute distress; alert,appropriate and cooperative throughout examination HEENT: Normocephalic and atraumatic without obvious abnormalities.  Ears, externally no deformities Pulm: Breathing comfortably in no respiratory distress Range of motion at  the waist: Flexion, rotation and lateral bending: full ROM  No echymosis or edema Rises to examination table with no difficulty Gait: minimally antalgic  Inspection/Deformity: No abnormality Paraspinus T:  TTP b  B Ankle Dorsiflexion (L5,4): 5/5 B Great  Toe Dorsiflexion (L5,4): 5/5 Heel Walk (L5): WNL Toe Walk (S1): WNL Rise/Squat (L4): WNL, mild pain On the left side, both dorsiflexion of the ankle as well as toe elevation or mildly decreased, but this is long-standing from an old injury many decades ago.  SENSORY B Medial Foot (L4): WNL B Dorsum (L5): WNL B Lateral (S1): WNL Light Touch: WNL Pinprick: WNL Patient does have one area at the point of her right sided lateral incision on her lower extremity on the right which is mildly numb, but this is long-standing.  REFLEXES Knee (L4): 2+ Ankle (S1): 2+  B SLR, seated: neg B SLR, supine: neg B FABER: neg B Reverse FABER: neg B Greater Troch: NT B Log Roll: neg B Stork: NT B Sciatic Notch: NT  Radiology: No results found.   Assessment & Plan:   Right-sided low back pain with right-sided sciatica  She continues to have intermittent right-sided symptoms. We discussed various options. She is going to try to do some traction. She is going to check with her chiropractor to see if this will be an option, if not we can set her up with physical therapy.  Increase her Lyrica dosing.  If she persistently has symptoms, then we can obtain advanced imaging for her spine. Consider intervention.  Signed,  Elpidio GaleaSpencer T. Khalee Mazo, MD   Patient's Medications  New Prescriptions   No medications on file  Previous Medications   CETIRIZINE (ZYRTEC) 10 MG TABLET    Take 10 mg by mouth as needed.    CLONAZEPAM (KLONOPIN) 0.5 MG TABLET    Take 1 tablet (0.5 mg total) by mouth 3 (three) times daily as needed. Take as needed for anxiety   IBUPROFEN (ADVIL,MOTRIN) 200 MG TABLET    Take 200 mg by mouth every 6 (six) hours as needed.   LEVONORGESTREL (MIRENA) 20 MCG/24HR IUD    1 each by Intrauterine route once.   SERTRALINE (ZOLOFT) 50 MG TABLET    1 tab by mouth daily.  Ok to increase to 2 tabs by mouth daily in 1-2 weeks if tolerating well   TIZANIDINE (ZANAFLEX) 4 MG TABLET    Take 1 tablet  (4 mg total) by mouth Nightly.  Modified Medications   Modified Medication Previous Medication   PREGABALIN (LYRICA) 100 MG CAPSULE pregabalin (LYRICA) 75 MG capsule      Take 1 capsule (100 mg total) by mouth 2 (two) times daily.    Take 1 capsule (75 mg total) by mouth 2 (two) times daily.  Discontinued Medications   DICLOFENAC (VOLTAREN) 75 MG EC TABLET    Take 1 tablet (75 mg total) by mouth 2 (two) times daily.   PREDNISONE (DELTASONE) 20 MG TABLET    2 tabs po daily for 5 days, then 1 po daily

## 2015-03-04 NOTE — Telephone Encounter (Signed)
We can burn a disc and she can pick it up - that would be the only way to get them there by tomorrow  Electronically Signed  By: Hannah BeatSpencer Traniece Boffa, MD On: 03/04/2015 2:52 PM

## 2015-03-04 NOTE — Telephone Encounter (Signed)
I will fax report now.  We will have to get you to pick up the disc because we don't have a way to send that to him.  It will just be at the front desk for you to pick up whenever you can.  Lupita Leashonna

## 2015-03-04 NOTE — Progress Notes (Signed)
Pre visit review using our clinic review tool, if applicable. No additional management support is needed unless otherwise documented below in the visit note. 

## 2015-03-05 MED ORDER — GABAPENTIN 300 MG PO CAPS: ORAL_CAPSULE | ORAL | Status: DC

## 2015-03-05 NOTE — Addendum Note (Signed)
Addended by: Hannah BeatOPLAND, Zephyr Ridley on: 03/05/2015 01:48 PM   Modules accepted: Orders, Medications

## 2015-03-05 NOTE — Patient Instructions (Signed)

## 2015-03-18 ENCOUNTER — Other Ambulatory Visit: Payer: Self-pay | Admitting: Family Medicine

## 2015-03-18 MED ORDER — CLONAZEPAM 0.5 MG PO TABS: 0.5000 mg | ORAL_TABLET | Freq: Three times a day (TID) | ORAL | Status: DC | PRN

## 2015-03-18 NOTE — Telephone Encounter (Signed)
Rx called in to requested pharmacy 

## 2015-03-18 NOTE — Telephone Encounter (Signed)
Last f/u appt 07/2014 

## 2015-03-31 ENCOUNTER — Encounter: Payer: Self-pay | Admitting: Family Medicine

## 2015-04-27 ENCOUNTER — Encounter: Payer: Self-pay | Admitting: Family Medicine

## 2015-04-27 ENCOUNTER — Ambulatory Visit (INDEPENDENT_AMBULATORY_CARE_PROVIDER_SITE_OTHER): Payer: 59 | Admitting: Family Medicine

## 2015-04-27 VITALS — BP 122/78 | HR 82 | Temp 98.1°F | Ht 68.0 in | Wt 250.8 lb

## 2015-04-27 DIAGNOSIS — F411 Generalized anxiety disorder: Secondary | ICD-10-CM

## 2015-04-27 DIAGNOSIS — Z1239 Encounter for other screening for malignant neoplasm of breast: Secondary | ICD-10-CM | POA: Diagnosis not present

## 2015-04-27 DIAGNOSIS — Z0000 Encounter for general adult medical examination without abnormal findings: Secondary | ICD-10-CM

## 2015-04-27 DIAGNOSIS — Z01419 Encounter for gynecological examination (general) (routine) without abnormal findings: Secondary | ICD-10-CM | POA: Diagnosis not present

## 2015-04-27 DIAGNOSIS — Z114 Encounter for screening for human immunodeficiency virus [HIV]: Secondary | ICD-10-CM

## 2015-04-27 DIAGNOSIS — Z113 Encounter for screening for infections with a predominantly sexual mode of transmission: Secondary | ICD-10-CM

## 2015-04-27 MED ORDER — VALACYCLOVIR HCL 1 G PO TABS: 1000.0000 mg | ORAL_TABLET | Freq: Two times a day (BID) | ORAL | Status: DC

## 2015-04-27 NOTE — Assessment & Plan Note (Signed)
Discussed USPSTF recommendations of cervical cancer screening.  She is aware that interval of 3 years is recommended but pt would prefer to have pap smear done today.  Pap smear done. Mammogram ordered- pt to schedule

## 2015-04-27 NOTE — Addendum Note (Signed)
Addended by: Desmond DikeKNIGHT, Teralyn Mullins H on: 04/27/2015 12:31 PM   Modules accepted: Orders

## 2015-04-27 NOTE — Progress Notes (Signed)
Subjective:   Patient ID: Crystal Houston, female    DOB: 1971/03/14, 44 y.o.   MRN: [id number]  Crystal Houston is a pleasant 44 y.o. year old female who presents to clinic today with Annual Exam  on 04/27/2015  HPI:  Last pap 03/10/14- done by me. Mammogram 03/20/14. No h/o abnormal pap smears. Has mirena IUD.  Anxiety-  Currently taking zoloft 50 mg daily.   Still using klonipin as needed- not too frequently, has only taken it once in past month.     Obesity-deteriorated.  No longer going to [employer]. Know she needs to walk more.  Wt Readings from Last 3 Encounters:  04/27/15 250 lb 12 oz (113.739 kg)  03/04/15 248 lb 8 oz (112.719 kg)  12/01/14 248 lb 8 oz (112.719 kg)   Current Outpatient Prescriptions on File Prior to Visit  Medication Sig Dispense Refill  . cetirizine (ZYRTEC) 10 MG tablet Take 10 mg by mouth as needed.     . clonazePAM (KLONOPIN) 0.5 MG tablet Take 1 tablet (0.5 mg total) by mouth 3 (three) times daily as needed. Take as needed for anxiety 90 tablet 0  . gabapentin (NEURONTIN) 300 MG capsule Follow titration as directed. 270 capsule 3  . ibuprofen (ADVIL,MOTRIN) 200 MG tablet Take 200 mg by mouth every 6 (six) hours as needed.    Marland Kitchen. levonorgestrel (MIRENA) 20 MCG/24HR IUD 1 each by Intrauterine route once.    . sertraline (ZOLOFT) 50 MG tablet 1 tab by mouth daily.  Ok to increase to 2 tabs by mouth daily in 1-2 weeks if tolerating well 60 tablet 3   No current facility-administered medications on file prior to visit.    No Known Allergies  Past Medical History  Diagnosis Date  . Anxiety   . Depression     Past Surgical History  Procedure Laterality Date  . Fracture surgery    . Orif ankle fracture  11/10/2011    Procedure: OPEN REDUCTION INTERNAL FIXATION (ORIF) ANKLE FRACTURE;  Surgeon: Loanne DrillingFrank V Aluisio;  Location: [facility] OR;  Service: Orthopedics;  Laterality: Right;  with screw applicaton  . I&d extremity  11/10/2011    Procedure:  IRRIGATION AND DEBRIDEMENT EXTREMITY;  Surgeon: Loanne DrillingFrank V Aluisio;  Location: [facility] OR;  Service: Orthopedics;  Laterality: Right;    Family History  Problem Relation Age of Onset  . Cancer Daughter     osteosarcoma of leg  . Cancer Paternal Grandfather     colon    History   Social History  . Marital Status: Married    Spouse Name: N/A  . Number of Children: 3  . 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 Smoking status: Never Smoker   . Smokeless tobacco: Never Used  . Alcohol Use: 1.2 oz/week    2 Glasses of wine per week     Comment: Very rare  . Drug Use: No  . Sexual Activity: No   Other Topics Concern  . Not on file   Social History Narrative   Daughter died in 2014 of osteosarcoma   The PMH, PSH, Social History, Family History, Medications, and allergies [facility], and have been updated if relevant.  Review of Systems  Constitutional: Negative.   HENT: Negative.   Respiratory: Negative.   Cardiovascular: Negative.   Gastrointestinal: Negative.   Endocrine: Negative.   Genitourinary: Negative.   Musculoskeletal:  Negative.   Skin: Negative.   Allergic/Immunologic: Negative.   Neurological: Negative.   Hematological: Negative.   Psychiatric/Behavioral: Negative.   All other systems reviewed and are negative.   Objective:    BP 122/78 mmHg  Pulse 82  Temp(Src) 98.1 F (36.7 C) (Oral)  Ht  (1.727 m)  Wt 250 lb 12 oz (113.739 kg)  BMI 38.14 kg/m2  SpO2 98%   Physical Exam    General:  Well-developed,well-nourished,in no acute distress; alert,appropriate and cooperative throughout examination Head:  normocephalic and atraumatic.   Eyes:  vision grossly intact, pupils equal, pupils round, and pupils reactive to light.   Ears:  R ear normal and L ear normal.   Nose:  no external deformity.   Mouth:  good dentition.   Neck:  No deformities, masses, or tenderness  noted. Breasts:  No mass, nodules, thickening, tenderness, bulging, retraction, inflamation, nipple discharge or skin changes noted.   Lungs:  Normal respiratory effort, chest expands symmetrically. Lungs are clear to auscultation, no crackles or wheezes. Heart:  Normal rate and regular rhythm. S1 and S2 normal without gallop, murmur, click, rub or other extra sounds. Abdomen:  Bowel sounds positive,abdomen soft and non-tender without masses, organomegaly or hernias noted. Rectal:  no external abnormalities.   Genitalia:  Pelvic Exam:        External: normal female genitalia without lesions or masses        Vagina: normal without lesions or masses        Cervix: normal without lesions or masses        Adnexa: normal bimanual exam without masses or fullness        Uterus: normal by palpation        Pap smear: performed Msk:  No deformity or scoliosis noted of thoracic or lumbar spine.   Extremities:  No clubbing, cyanosis, edema, or deformity noted with normal full range of motion of all joints.   Neurologic:  alert & oriented X3 and gait normal.   Skin:  Intact without suspicious lesions or rashes Cervical Nodes:  No lymphadenopathy noted Axillary Nodes:  No palpable lymphadenopathy Psych:  Cognition and judgment appear intact. Alert and cooperative with normal attention span and concentration. No apparent delusions, illusions, hallucinations       Assessment & Plan:   Routine general medical examination at a health care facility  Encounter for routine gynecological examination No Follow-up on file.

## 2015-04-27 NOTE — Patient Instructions (Signed)
Good to see you. I would be honored to see Crystal Houston.  We will call you with your lab results and you can see them online.  Please call [facility] to set up your mammogram.

## 2015-04-27 NOTE — Progress Notes (Signed)
Pre visit review using our clinic review tool, if applicable. No additional management support is needed unless otherwise documented below in the visit note. 

## 2015-04-27 NOTE — Addendum Note (Signed)
Addended by: Liane ComberHAVERS, NATASHA C on: 04/27/2015 01:40 PM   Modules accepted: Orders, SmartSet

## 2015-04-27 NOTE — Assessment & Plan Note (Signed)
Reviewed preventive care protocols, scheduled due services, and updated immunizations Discussed nutrition, exercise, diet, and healthy lifestyle.  Orders Placed This Encounter  Procedures  . MM Digital Screening  . CBC with Differential/Platelet  . Comprehensive metabolic panel  . Lipid panel  . TSH  . RPR  . HIV antibody (with reflex)

## 2015-04-28 ENCOUNTER — Other Ambulatory Visit: Payer: Self-pay | Admitting: *Deleted

## 2015-04-28 LAB — CBC WITH DIFFERENTIAL/PLATELET
Basophils Absolute: 0.1 10*3/uL (ref 0.0–0.2)
Basos: 1 %
EOS (ABSOLUTE): 0.1 10*3/uL (ref 0.0–0.4)
EOS: 2 %
Hematocrit: 39.3 % (ref 34.0–46.6)
Hemoglobin: 13.4 g/dL (ref 11.1–15.9)
Immature Grans (Abs): 0 10*3/uL (ref 0.0–0.1)
Immature Granulocytes: 0 %
LYMPHS: 23 %
Lymphocytes Absolute: 1.2 10*3/uL (ref 0.7–3.1)
MCH: 28.9 pg (ref 26.6–33.0)
MCHC: 34.1 g/dL (ref 31.5–35.7)
MCV: 85 fL (ref 79–97)
MONOCYTES: 7 %
Monocytes Absolute: 0.4 10*3/uL (ref 0.1–0.9)
NEUTROS ABS: 3.6 10*3/uL (ref 1.4–7.0)
NEUTROS PCT: 67 %
PLATELETS: 372 10*3/uL (ref 150–379)
RBC: 4.63 x10E6/uL (ref 3.77–5.28)
RDW: 13.2 % (ref 12.3–15.4)
WBC: 5.4 10*3/uL (ref 3.4–10.8)

## 2015-04-28 LAB — RPR: RPR Ser Ql: NONREACTIVE

## 2015-04-28 LAB — COMPREHENSIVE METABOLIC PANEL
ALT: 16 IU/L (ref 0–32)
AST: 17 IU/L (ref 0–40)
Albumin/Globulin Ratio: 1.6 (ref 1.1–2.5)
Albumin: 4.2 g/dL (ref 3.5–5.5)
Alkaline Phosphatase: 75 IU/L (ref 39–117)
BUN/Creatinine Ratio: 13 (ref 9–23)
BUN: 9 mg/dL (ref 6–24)
Bilirubin Total: 0.4 mg/dL (ref 0.0–1.2)
CALCIUM: 9 mg/dL (ref 8.7–10.2)
CO2: 23 mmol/L (ref 18–29)
Chloride: 99 mmol/L (ref 97–108)
Creatinine, Ser: 0.71 mg/dL (ref 0.57–1.00)
GFR, EST AFRICAN AMERICAN: 120 mL/min/{1.73_m2} (ref >59)
GFR, EST NON AFRICAN AMERICAN: 104 mL/min/{1.73_m2} (ref >59)
GLUCOSE: 79 mg/dL (ref 65–99)
Globulin, Total: 2.6 g/dL (ref 1.5–4.5)
POTASSIUM: 4.4 mmol/L (ref 3.5–5.2)
SODIUM: 139 mmol/L (ref 134–144)
TOTAL PROTEIN: 6.8 g/dL (ref 6.0–8.5)

## 2015-04-28 LAB — LIPID PANEL
Chol/HDL Ratio: 5.7 ratio units — ABNORMAL HIGH (ref 0.0–4.4)
Cholesterol, Total: 199 mg/dL (ref 100–199)
HDL: 35 mg/dL — ABNORMAL LOW (ref >39)
LDL Calculated: 137 mg/dL — ABNORMAL HIGH (ref 0–99)
TRIGLYCERIDES: 136 mg/dL (ref 0–149)
VLDL Cholesterol Cal: 27 mg/dL (ref 5–40)

## 2015-04-28 LAB — TSH: TSH: 2.89 u[IU]/mL (ref 0.450–4.500)

## 2015-04-28 LAB — HIV ANTIBODY (ROUTINE TESTING W REFLEX): HIV Screen 4th Generation wRfx: NONREACTIVE

## 2015-04-28 MED ORDER — GABAPENTIN 300 MG PO CAPS: ORAL_CAPSULE | ORAL | Status: DC

## 2015-04-28 NOTE — Telephone Encounter (Signed)
Last office visit 04/27/2015.  Last refilled 03/05/2015 for #[id number] with 3 refills.  Ok to refill?

## 2015-04-29 ENCOUNTER — Encounter: Payer: Self-pay | Admitting: Family Medicine

## 2015-04-29 ENCOUNTER — Other Ambulatory Visit: Payer: Self-pay | Admitting: Family Medicine

## 2015-04-29 ENCOUNTER — Encounter: Payer: Self-pay | Admitting: *Deleted

## 2015-04-29 LAB — PAP LB (LIQUID-BASED): PAP Smear Comment: 0

## 2015-04-29 MED ORDER — PHENTERMINE HCL 15 MG PO CAPS
ORAL_CAPSULE | ORAL | Status: DC
Start: 2015-04-29 — End: 2015-05-27

## 2015-05-02 ENCOUNTER — Encounter: Payer: Self-pay | Admitting: Family Medicine

## 2015-05-02 MED ORDER — GABAPENTIN 300 MG PO CAPS: 900.0000 mg | ORAL_CAPSULE | Freq: Three times a day (TID) | ORAL | Status: DC

## 2015-05-12 ENCOUNTER — Ambulatory Visit
Admission: RE | Admit: 2015-05-12 | Discharge: 2015-05-12 | Disposition: A | Discharge status: Home / Self Care | Payer: 59 | Source: Ambulatory Visit | Attending: Family Medicine | Admitting: Family Medicine

## 2015-05-12 DIAGNOSIS — Z1231 Encounter for screening mammogram for malignant neoplasm of breast: Secondary | ICD-10-CM | POA: Diagnosis present

## 2015-05-12 DIAGNOSIS — Z1239 Encounter for other screening for malignant neoplasm of breast: Secondary | ICD-10-CM

## 2015-05-18 ENCOUNTER — Encounter: Payer: Self-pay | Admitting: Family Medicine

## 2015-05-18 NOTE — Telephone Encounter (Signed)
Please help set up a follow-up appointment and evaluation.

## 2015-05-27 ENCOUNTER — Ambulatory Visit (INDEPENDENT_AMBULATORY_CARE_PROVIDER_SITE_OTHER): Payer: 59 | Admitting: Family Medicine

## 2015-05-27 ENCOUNTER — Encounter: Payer: Self-pay | Admitting: Family Medicine

## 2015-05-27 VITALS — BP 106/64 | HR 78 | Temp 98.4°F | Ht 68.0 in | Wt 252.8 lb

## 2015-05-27 DIAGNOSIS — M5441 Lumbago with sciatica, right side: Secondary | ICD-10-CM

## 2015-05-27 NOTE — Progress Notes (Signed)
[address] [place] [address] Phone: [phone] Fax: [phone]  Patient ID: Crystal Houston MRN: [id number], DOB: [birth year], 44 y.o. Date of Encounter: 05/27/2015  Primary Physician:  Ruthe Mannan, MD   Chief Complaint: Back Pain  Subjective:   History of Present Illness:  Crystal Houston is a 44 y.o. very pleasant female patient who presents with the following: Back Pain  The patient has been doing conservative management for her back pain for approximately one year.  She is intermittently been doing okay, and she had been doing reasonably when she was taking some Lyrica, but she was even getting some pain at baseline.  She has taken steroids, and she is also been under the management of a chiropractic physician for some time intermittently.  She has been doing home rehabilitation as well.  She has failed to improve with manipulation and traction.  She has tried multiple oral medications including NSAIDs, steroids, multiple neuropathic pain medications, and muscle relaxants without much relief.  Initially, she had been doing reasonably okay, but she has persistent back pain that is limiting her daily function.  03/04/2015 Last OV with Hannah Beat, MD  When started gabapentin had been going well.  Was going to the chiropractor and doing pretty well at that point. Was going once a month and seeing Cherre Huger.   When cut back, she got worse. Could not go a whole month. If she wakes up at night, and has to put some lidocaine. That is about the only way to make it feel better.   Laying down just and with standing for more than one time.  Mostly the laying down at night.  Will use a lidocaine patch or icy hot with lidocaine.   03/04/2015 Last OV with Hannah Beat, MD  Thought going ok, but still gets some pain even on Lyrica, and forgot her medication last weekend. Woke up about 2 or 3 AM, really was hurting to breath. Went to [facility], got some lidocaine / [facility] and  also got some [facility]acare. Loaded her back up with the lidocaine. Still was not great. But then the top started to hurt. Then put heat on.   Back where bra is hurting.  B hands are tingling.   Now doing Lyrica only.   12/01/2014 Last OV with Hannah Beat, MD  Back pain: pleasant patient who I saw approximately 6 months ago has been intermittently having back pain for approximately 7 months. Prior then, she has had back pain previous to this for least last 2 or 3 years intermittently. She has had some chiropractic manipulations a few years ago which seemed to help quite a bit. Over the last 2 or 3 months her symptoms seem to have changed and she is now having radicular sensations down her right leg with some numbness and tingling. She has not have any frank numbness or weakness. She is not having any bowel or bladder incontinence.  She did have some relief of symptoms when she was taking some oral Voltaren as well as some Zanaflex.  She has never done any physical therapy for her back. She has never done any acupuncture.  Intermittently has had back pain for 7 months.  2 children in bed with her, but now none, and having a lot of pain.   Last night, got up between 2 or 3 and almost in tears. Lower back.  Has also found some tingling in R leg.   Went to the chiropractor. [employer]  to Cherre Huger for over a year.   ? R leg is longer.  Pain with laying down.   06/11/2014 Last OV with Hannah Beat, MD  ongoing for approximately: 3-4 weeks The patient has had back pain before. The back pain is localized into the lumbar spine area. They also describe no radiculopathy.  The past several weeks, it has gotten worse, when going to bed Would start hurting so bad in her lower back. Then not but only on one side. Sleeping with several pillows. Got up at around three in the morning. Used some lidoderm patches. Had some fibromyalgia.   With sleep. 3-4 weeks.   No numbness or tingling. No bowel or  bladder incontinence. No focal weakness. 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  Patient Active Problem List   Diagnosis Date Noted  . Routine general medical examination at a health care facility 03/10/2014  . Encounter for routine gynecological examination 03/10/2014  . Other malaise and fatigue 03/10/2014  . Traumatic hematoma of right lower leg 11/08/2013  . Grief at loss of child 07/02/2013  . Obesity 05/03/2011  . Anxiety state 12/21/2010  . ADJ DISORDER WITH MIXED ANXIETY & DEPRESSED MOOD 12/21/2010  . DEPRESSION 12/21/2010    Past Medical History  Diagnosis Date  . Anxiety   . Depression     Past Surgical History  Procedure Laterality Date  . Fracture surgery    . Orif ankle fracture  11/10/2011    Procedure: OPEN REDUCTION INTERNAL FIXATION (ORIF) ANKLE FRACTURE;  Surgeon: Loanne Drilling;  Location: [facility] OR;  Service: Orthopedics;  Laterality: Right;  with screw applicaton  . I&d extremity  11/10/2011    Procedure: IRRIGATION AND DEBRIDEMENT EXTREMITY;  Surgeon: Loanne Drilling;  Location: [facility] OR;  Service: Orthopedics;  Laterality: Right;    History   Social History  . Marital Status: Married    Spouse Name: N/A  . Number of Children: 3  . 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 Smoking status: Never Smoker   . Smokeless tobacco: Never Used  . Alcohol Use: 1.2 oz/week    2 Glasses of wine per week     Comment: Very rare  . Drug Use: No  . Sexual Activity: No   Other Topics Concern  . Not on file   Social History Narrative   Daughter died in 08-Apr-2013 of osteosarcoma    Family History  Problem Relation Age of Onset  . Cancer Daughter      osteosarcoma of leg  . Cancer Paternal Grandfather     colon    No Known Allergies  Medication list reviewed and updated in full in [facility].   Review of Systems  GEN: No fevers, chills. Nontoxic. Primarily MSK c/o today. MSK: Detailed in the HPI GI: tolerating PO intake without difficulty Neuro: As above  Otherwise the pertinent positives of the ROS are noted above.     Objective:   Physical Exam Filed Vitals:   05/27/15 1056  BP: 106/64  Pulse: 78  Temp: 98.4 F (36.9 C)  TempSrc: Oral  Height: 5\' 8"  (1.727 m)  Weight: 252 lb 12 oz (114.647 kg)  SpO2: 95%    Gen: Well-developed,well-nourished,in no  acute distress; alert,appropriate and cooperative throughout examination HEENT: Normocephalic and atraumatic without obvious abnormalities.  Ears, externally no deformities Pulm: Breathing comfortably in no respiratory distress Range of motion at  the waist: Flexion, rotation and lateral bending: full ROM, mild restriction flexion and ext  No echymosis or edema Rises to examination table with no difficulty Gait: minimally antalgic  Inspection/Deformity: No abnormality Paraspinus T:  TTP b, l3-s1  B Ankle Dorsiflexion (L5,4): 5/5 B Great Toe Dorsiflexion (L5,4): 5/5 Heel Walk (L5): WNL Toe Walk (S1): WNL Rise/Squat (L4): WNL, mild pain On the left side, both dorsiflexion of the ankle as well as toe elevation or mildly decreased, but this is long-standing from an old injury many decades ago.  SENSORY B Medial Foot (L4): WNL B Dorsum (L5): WNL B Lateral (S1): WNL Light Touch: WNL Pinprick: WNL Patient does have one area at the point of her right sided lateral incision on her lower extremity on the right which is mildly numb, but this is long-standing.  REFLEXES Knee (L4): 2+ Ankle (S1): 2+  B SLR, seated: neg B SLR, supine: neg B FABER: neg B Reverse FABER: neg B Greater Troch: NT B Log Roll: neg B Stork: NT B Sciatic Notch:  NT  Radiology: CLINICAL DATA: Right-sided low back pain with sciatic symptoms  EXAM: LUMBAR SPINE - COMPLETE 4+ VIEW  COMPARISON: Abdominal flat plate of March 26, 2012.  FINDINGS: The lumbar vertebral bodies are preserved in height. The intervertebral disc space heights are well maintained. There is no spondylolisthesis. No pars defects are demonstrated. The pedicles and transverse processes are unremarkable. The observed portions of the sacrum are normal.  IMPRESSION: There is no acute or significant chronic bony abnormality of the lumbar spine.   Electronically Signed  By: David Swaziland  On: 12/01/2014 17:02   Assessment & Plan:   Right-sided low back pain with right-sided sciatica - Plan: MR Lumbar Spine Wo Contrast  Obtain an MRI of the lumbar spine without contrast to evaluate for herniated disc, spinal stenosis, spinal cord impingement, cord edema, or other occult process that may be causing the patient's unrelenting back pain.  She has failed traditional conservative management, all except interventional treatments.  Imaging will help guide potential spinal interventions versus potential neurosurgery.  Follow-up: depending on MRI results  New Prescriptions   No medications on file   Orders Placed This Encounter  Procedures  . MR Lumbar Spine Wo Contrast    Signed,  Sidharth Leverette T. Marija Calamari, MD   Patient's Medications  New Prescriptions   No medications on file  Previous Medications   CETIRIZINE (ZYRTEC) 10 MG TABLET    Take 10 mg by mouth as needed.    CLONAZEPAM (KLONOPIN) 0.5 MG TABLET    Take 1 tablet (0.5 mg total) by mouth 3 (three) times daily as needed. Take as needed for anxiety   GABAPENTIN (NEURONTIN) 300 MG CAPSULE    Take 3 capsules (900 mg total) by mouth 3 (three) times daily.   IBUPROFEN (ADVIL,MOTRIN) 200 MG TABLET    Take 200 mg by mouth every 6 (six) hours as needed.   LEVONORGESTREL (MIRENA) 20 MCG/24HR IUD    1 each by  Intrauterine route once.   SERTRALINE (ZOLOFT) 50 MG TABLET    1 tab by mouth daily.  Ok to increase to 2 tabs by mouth daily in 1-2 weeks if tolerating well  Modified Medications   No medications on file  Discontinued Medications   GABAPENTIN (NEURONTIN) 300 MG CAPSULE  Follow titration as directed.   PHENTERMINE 15 MG CAPSULE    1 tab by mouth twice daily as directed.

## 2015-05-27 NOTE — Progress Notes (Signed)
Pre visit review using our clinic review tool, if applicable. No additional management support is needed unless otherwise documented below in the visit note. 

## 2015-05-27 NOTE — Patient Instructions (Signed)

## 2015-05-30 ENCOUNTER — Ambulatory Visit
Admission: RE | Admit: 2015-05-30 | Discharge: 2015-05-30 | Disposition: A | Discharge status: Home / Self Care | Payer: 59 | Source: Ambulatory Visit | Attending: Family Medicine | Admitting: Family Medicine

## 2015-05-30 DIAGNOSIS — M5137 Other intervertebral disc degeneration, lumbosacral region: Secondary | ICD-10-CM | POA: Diagnosis not present

## 2015-05-30 DIAGNOSIS — M5441 Lumbago with sciatica, right side: Secondary | ICD-10-CM

## 2015-05-30 DIAGNOSIS — M2548 Effusion, other site: Secondary | ICD-10-CM | POA: Insufficient documentation

## 2015-05-30 DIAGNOSIS — M47896 Other spondylosis, lumbar region: Secondary | ICD-10-CM | POA: Diagnosis not present

## 2015-05-30 DIAGNOSIS — M5136 Other intervertebral disc degeneration, lumbar region: Secondary | ICD-10-CM | POA: Diagnosis not present

## 2015-06-01 ENCOUNTER — Other Ambulatory Visit: Payer: Self-pay | Admitting: Family Medicine

## 2015-06-01 DIAGNOSIS — M47816 Spondylosis without myelopathy or radiculopathy, lumbar region: Secondary | ICD-10-CM

## 2015-06-01 DIAGNOSIS — M545 Low back pain: Principal | ICD-10-CM

## 2015-06-01 DIAGNOSIS — G8929 Other chronic pain: Secondary | ICD-10-CM

## 2015-06-04 ENCOUNTER — Other Ambulatory Visit: Payer: Self-pay | Admitting: *Deleted

## 2015-06-04 MED ORDER — SERTRALINE HCL 50 MG PO TABS: ORAL_TABLET | ORAL | Status: DC

## 2015-06-18 ENCOUNTER — Ambulatory Visit: Payer: 59 | Admitting: Family Medicine

## 2015-06-18 ENCOUNTER — Other Ambulatory Visit: Payer: 59 | Admitting: Family Medicine

## 2015-06-18 DIAGNOSIS — Z0000 Encounter for general adult medical examination without abnormal findings: Secondary | ICD-10-CM

## 2015-06-18 NOTE — Addendum Note (Signed)
Addended by: Baldomero LamyHAVERS, NATASHA C on: 06/18/2015 11:37 AM   Modules accepted: Orders

## 2015-06-21 LAB — NICOTINE/COTININE METABOLITES
Cotinine: NOT DETECTED ng/mL
NICOTINE: NOT DETECTED ng/mL

## 2015-06-22 ENCOUNTER — Encounter: Payer: Self-pay | Admitting: Family Medicine

## 2015-06-23 NOTE — Telephone Encounter (Signed)
This information was included on the form, and I made a copy, which also indicates a negative result

## 2015-06-23 NOTE — Telephone Encounter (Signed)
i just noticed that the form indicates that the actual results have to be faxed along with the form. Form resent.

## 2015-07-03 DIAGNOSIS — Z0279 Encounter for issue of other medical certificate: Secondary | ICD-10-CM

## 2015-07-16 ENCOUNTER — Encounter: Payer: Self-pay | Admitting: Family Medicine

## 2015-07-22 ENCOUNTER — Encounter: Payer: Self-pay | Admitting: Family Medicine

## 2015-07-23 NOTE — Telephone Encounter (Signed)
[name] pt not [place], forms placed in Dr. Elmer Sow inbox

## 2015-07-23 NOTE — Telephone Encounter (Signed)
Pt dropped off form for work. Placing in [place]'s inbox. Please call [phone] when ready to be picked up. Thanks

## 2015-08-13 ENCOUNTER — Encounter: Payer: Self-pay | Admitting: Family Medicine

## 2015-08-13 ENCOUNTER — Other Ambulatory Visit: Payer: Self-pay | Admitting: Family Medicine

## 2015-08-13 MED ORDER — FLUCONAZOLE 150 MG PO TABS: 150.0000 mg | ORAL_TABLET | Freq: Once | ORAL | Status: AC

## 2015-09-10 ENCOUNTER — Encounter: Payer: Self-pay | Admitting: Family Medicine

## 2015-09-17 ENCOUNTER — Ambulatory Visit: Payer: Self-pay | Admitting: Family Medicine

## 2015-09-17 ENCOUNTER — Encounter: Payer: Self-pay | Admitting: Family Medicine

## 2015-09-17 ENCOUNTER — Ambulatory Visit (INDEPENDENT_AMBULATORY_CARE_PROVIDER_SITE_OTHER): Payer: 59 | Admitting: Family Medicine

## 2015-09-17 VITALS — BP 114/62 | HR 73 | Temp 98.0°F | Wt 245.8 lb

## 2015-09-17 DIAGNOSIS — Z30432 Encounter for removal of intrauterine contraceptive device: Secondary | ICD-10-CM | POA: Diagnosis not present

## 2015-09-17 DIAGNOSIS — N760 Acute vaginitis: Secondary | ICD-10-CM | POA: Diagnosis not present

## 2015-09-17 MED ORDER — FLUCONAZOLE 150 MG PO TABS: ORAL_TABLET | ORAL | Status: DC

## 2015-09-17 NOTE — Addendum Note (Signed)
Addended by: Alvina ChouWALSH, Rema Lievanos J on: 09/17/2015 12:42 PM   Modules accepted: Orders

## 2015-09-17 NOTE — Assessment & Plan Note (Signed)
IUD strings visualized Cervix prepped with betadine and IUD removed with ringed foreceps. Tolerated procedure well without discomfort or bleeding.

## 2015-09-17 NOTE — Addendum Note (Signed)
Addended by: Dianne DunARON, Sevag Shearn M on: 09/17/2015 12:51 PM   Modules accepted: Orders

## 2015-09-17 NOTE — Assessment & Plan Note (Signed)
Wet prep sent.   

## 2015-09-17 NOTE — Progress Notes (Signed)
Pre visit review using our clinic review tool, if applicable. No additional management support is needed unless otherwise documented below in the visit note. 

## 2015-09-17 NOTE — Addendum Note (Signed)
Addended by: Dianne DunARON, Syliva Mee M on: 09/17/2015 12:31 PM   Modules accepted: Orders

## 2015-09-17 NOTE — Progress Notes (Signed)
SUBJECTIVE:  44 y.o. female complains of white and copious vaginal discharge for 2 week(s). Denies abnormal vaginal bleeding or significant pelvic pain or fever. No UTI symptoms. Denies history of known exposure to STD.  Also would like her IUD removed today.  Mirena IUD placed 3 years ago.  Now with a partner- long term who has had a vasectomy.  She feels mirena is keeping her from losing weight.  Current Outpatient Prescriptions on File Prior to Visit  Medication Sig Dispense Refill  . cetirizine (ZYRTEC) 10 MG tablet Take 10 mg by mouth as needed.     . clonazePAM (KLONOPIN) 0.5 MG tablet Take 1 tablet (0.5 mg total) by mouth 3 (three) times daily as needed. Take as needed for anxiety 90 tablet 0  . gabapentin (NEURONTIN) 300 MG capsule Take 3 capsules (900 mg total) by mouth 3 (three) times daily. 810 capsule 3  . ibuprofen (ADVIL,MOTRIN) 200 MG tablet Take 200 mg by mouth every 6 (six) hours as needed.    Marland Kitchen. levonorgestrel (MIRENA) 20 MCG/24HR IUD 1 each by Intrauterine route once.    . sertraline (ZOLOFT) 50 MG tablet 1 tab by mouth daily.  Ok to increase to 2 tabs by mouth daily in 1-2 weeks if tolerating well 180 tablet 1   No current facility-administered medications on file prior to visit.    No Known Allergies  Past Medical History  Diagnosis Date  . Anxiety   . Depression     Past Surgical History  Procedure Laterality Date  . Fracture surgery    . Orif ankle fracture  11/10/2011    Procedure: OPEN REDUCTION INTERNAL FIXATION (ORIF) ANKLE FRACTURE;  Surgeon: Loanne DrillingFrank V Aluisio;  Location: [facility] OR;  Service: Orthopedics;  Laterality: Right;  with screw applicaton  . I&d extremity  11/10/2011    Procedure: IRRIGATION AND DEBRIDEMENT EXTREMITY;  Surgeon: Loanne DrillingFrank V Aluisio;  Location: [facility] OR;  Service: Orthopedics;  Laterality: Right;    Family History  Problem Relation Age of Onset  . Cancer Daughter     osteosarcoma of leg  . Cancer Paternal Grandfather     colon    Social  History   Social History  . Marital Status: Married    Spouse Name: N/A  . Number of Children: 3  . 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 Smoking status: Never Smoker   . Smokeless tobacco: Never Used  . Alcohol Use: 1.2 oz/week    2 Glasses of wine per week     Comment: Very rare  . Drug Use: No  . Sexual Activity: No   Other Topics Concern  . Not on file   Social History Narrative   Daughter died in 2014 of osteosarcoma   The PMH, PSH, Social History, Family History, Medications, and allergies [facility], and have been updated if relevant.   No LMP recorded. Patient is not currently having periods (Reason: IUD).  OBJECTIVE:  BP 114/62 mmHg  Pulse 73  Temp(Src) 98 F (36.7 C) (Oral)  Wt 245 lb 12 oz (111.471 kg)  SpO2 96%  She appears well, afebrile. Abdomen: benign, soft, nontender, no masses. Pelvic Exam: VAGINA: normal appearing vagina with normal color and discharge, no lesions, vaginal discharge - white and creamy, WET M[place] IUD string visualized

## 2015-10-04 ENCOUNTER — Other Ambulatory Visit: Payer: Self-pay | Admitting: Family Medicine

## 2015-11-18 ENCOUNTER — Encounter: Payer: Self-pay | Admitting: Family Medicine

## 2015-11-18 ENCOUNTER — Ambulatory Visit (INDEPENDENT_AMBULATORY_CARE_PROVIDER_SITE_OTHER): Payer: 59 | Admitting: Family Medicine

## 2015-11-18 VITALS — BP 108/72 | HR 65 | Temp 97.7°F | Wt 251.5 lb

## 2015-11-18 DIAGNOSIS — M2550 Pain in unspecified joint: Secondary | ICD-10-CM | POA: Insufficient documentation

## 2015-11-18 MED ORDER — MELOXICAM 15 MG PO TABS: 15.0000 mg | ORAL_TABLET | Freq: Every day | ORAL | Status: DC

## 2015-11-18 NOTE — Progress Notes (Signed)
Pre visit review using our clinic review tool, if applicable. No additional management support is needed unless otherwise documented below in the visit note. 

## 2015-11-18 NOTE — Patient Instructions (Addendum)
Good to see you. I will call you with your lab results.  Please STOP taking Diclofenac, start Mobic (meloxicam) with food.

## 2015-11-18 NOTE — Addendum Note (Signed)
Addended by: Liane ComberHAVERS, NATASHA C on: 11/18/2015 11:40 AM   Modules accepted: Orders

## 2015-11-18 NOTE — Assessment & Plan Note (Signed)
New- Check rheum panel today to rule out other forms of arthritis. Could be OA as well. Change anti inflammatory to see if this may help with pain- ok to use tylenol as well. D/c diclofenac, start mobic- WITH FOOD. Orders Placed This Encounter  Procedures  . TSH  . Cyclic Citrul Peptide Antibody, IGG  . Rheumatoid factor  . Sedimentation rate  . C-reactive protein  . Comprehensive metabolic panel  . CBC

## 2015-11-18 NOTE — Progress Notes (Signed)
Subjective:   Patient ID: Crystal CooperWendolyn M Mccann, female    DOB: [birth year], 44 y.o.   MRN: [id number]  Crystal Houston is a pleasant 44 y.o. year old female who presents to clinic today with Arthritis  on 11/18/2015  HPI:  Has been followed by Dr. Patsy Lageropland and Delbert HarnessMurphy Wainer for back pain. Has been taking Diclofenac.  This had been helping but over past several weeks, she feels "all of her joints hurt."  Bilateral knees, ankles, wrists, shoulder hurt most of the day.  Very stiff in the morning.  She feels they are swollen. Hands have not been effected.  Joints have not been red or warm.  Current Outpatient Prescriptions on File Prior to Visit  Medication Sig Dispense Refill  . cetirizine (ZYRTEC) 10 MG tablet Take 10 mg by mouth as needed.     . clonazePAM (KLONOPIN) 0.5 MG tablet Take 1 tablet (0.5 mg total) by mouth 3 (three) times daily as needed. Take as needed for anxiety 90 tablet 0  . gabapentin (NEURONTIN) 300 MG capsule Take 3 capsules (900 mg total) by mouth 3 (three) times daily. 810 capsule 3  . ibuprofen (ADVIL,MOTRIN) 200 MG tablet Take 200 mg by mouth every 6 (six) hours as needed.    Marland Kitchen. levonorgestrel (MIRENA) 20 MCG/24HR IUD 1 each by Intrauterine route once.    . sertraline (ZOLOFT) 50 MG tablet Take 1 tablet by mouth  daily. Ok to increase to 2  tablets by mouth daily in  1-2 weeks if tolerating  well. 180 tablet 1   No current facility-administered medications on file prior to visit.    No Known Allergies  Past Medical History  Diagnosis Date  . Anxiety   . Depression     Past Surgical History  Procedure Laterality Date  . Fracture surgery    . Orif ankle fracture  11/10/2011    Procedure: OPEN REDUCTION INTERNAL FIXATION (ORIF) ANKLE FRACTURE;  Surgeon: Loanne DrillingFrank V Aluisio;  Location: [facility] OR;  Service: Orthopedics;  Laterality: Right;  with screw applicaton  . I&d extremity  11/10/2011    Procedure: IRRIGATION AND DEBRIDEMENT EXTREMITY;  Surgeon: Loanne DrillingFrank V Aluisio;   Location: [facility] OR;  Service: Orthopedics;  Laterality: Right;    Family History  Problem Relation Age of Onset  . Cancer Daughter     osteosarcoma of leg  . Cancer Paternal Grandfather     colon    Social History   Social History  . Marital Status: Married    Spouse Name: N/A  . Number of Children: 3  . 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 Smoking status: Never Smoker   . Smokeless tobacco: Never Used  . Alcohol Use: 1.2 oz/week    2 Glasses of wine per week     Comment: Very rare  . Drug Use: No  . Sexual Activity: No   Other Topics Concern  . Not on file   Social History Narrative   Daughter died in 2014 of osteosarcoma   The PMH, PSH, Social History, Family History, Medications, and allergies [facility], and have been updated if relevant.   Review of Systems  Constitutional: Positive for fatigue. Negative for fever.  Cardiovascular: Positive for leg swelling.  Endocrine: Negative.   Musculoskeletal: Positive for back pain, joint swelling and arthralgias. Negative for myalgias and neck pain.  Skin: Negative.   Allergic/Immunologic: Negative.  Neurological: Negative.   Hematological: Negative.   All other systems reviewed and are negative.      Objective:    BP 108/72 mmHg  Pulse 65  Temp(Src) 97.7 F (36.5 C) (Oral)  Wt 251 lb 8 oz (114.08 kg)  SpO2 98%   Physical Exam  Constitutional: She is oriented to person, place, and time. She appears well-developed and well-nourished. No distress.  HENT:  Head: Normocephalic.  Eyes: Conjunctivae are normal.  Cardiovascular: Normal rate.   Pulmonary/Chest: Effort normal.  Musculoskeletal:  Some decreased ROM of left shoulder, otherwise joints unremarkable on exam. Some mild bilateral LE edema  Neurological: She is alert and oriented to person, place, and time. No cranial nerve deficit.  Skin: Skin is warm and dry.    Psychiatric: She has a normal mood and affect. Her behavior is normal. Thought content normal.  Nursing note and vitals reviewed.         Assessment & Plan:   Polyarthralgia - Plan: TSH, Cyclic Citrul Peptide Antibody, IGG, Rheumatoid factor, Sedimentation rate, C-reactive protein, Comprehensive metabolic panel, CBC, CANCELED: Rheumatoid Factor, CANCELED: Cyclic Citrul Peptide Antibody, IGG, CANCELED: Sedimentation Rate, CANCELED: C-reactive protein, CANCELED: Comprehensive metabolic panel No Follow-up on file.

## 2015-11-19 LAB — CBC
Hematocrit: 39.7 % (ref 34.0–46.6)
Hemoglobin: 13.3 g/dL (ref 11.1–15.9)
MCH: 29.4 pg (ref 26.6–33.0)
MCHC: 33.5 g/dL (ref 31.5–35.7)
MCV: 88 fL (ref 79–97)
PLATELETS: 366 10*3/uL (ref 150–379)
RBC: 4.52 x10E6/uL (ref 3.77–5.28)
RDW: 12.7 % (ref 12.3–15.4)
WBC: 5.5 10*3/uL (ref 3.4–10.8)

## 2015-11-19 LAB — COMPREHENSIVE METABOLIC PANEL
A/G RATIO: 1.4 (ref 1.1–2.5)
ALBUMIN: 4 g/dL (ref 3.5–5.5)
ALT: 34 IU/L — ABNORMAL HIGH (ref 0–32)
AST: 21 IU/L (ref 0–40)
Alkaline Phosphatase: 80 IU/L (ref 39–117)
BUN / CREAT RATIO: 16 (ref 9–23)
BUN: 12 mg/dL (ref 6–24)
Bilirubin Total: 0.3 mg/dL (ref 0.0–1.2)
CALCIUM: 9.4 mg/dL (ref 8.7–10.2)
CO2: 24 mmol/L (ref 18–29)
CREATININE: 0.76 mg/dL (ref 0.57–1.00)
Chloride: 100 mmol/L (ref 96–106)
GFR, EST AFRICAN AMERICAN: 110 mL/min/{1.73_m2} (ref >59)
GFR, EST NON AFRICAN AMERICAN: 96 mL/min/{1.73_m2} (ref >59)
GLOBULIN, TOTAL: 2.8 g/dL (ref 1.5–4.5)
Glucose: 78 mg/dL (ref 65–99)
POTASSIUM: 4.9 mmol/L (ref 3.5–5.2)
SODIUM: 139 mmol/L (ref 134–144)
TOTAL PROTEIN: 6.8 g/dL (ref 6.0–8.5)

## 2015-11-19 LAB — SEDIMENTATION RATE: SED RATE: 7 mm/h (ref 0–32)

## 2015-11-19 LAB — TSH: TSH: 2.75 u[IU]/mL (ref 0.450–4.500)

## 2015-11-19 LAB — RHEUMATOID FACTOR: Rhuematoid fact SerPl-aCnc: <10 IU/mL (ref 0.0–13.9)

## 2015-11-19 LAB — C-REACTIVE PROTEIN: CRP: 9.8 mg/L — ABNORMAL HIGH (ref 0.0–4.9)

## 2015-11-20 LAB — CYCLIC CITRUL PEPTIDE ANTIBODY, IGG/IGA: Cyclic Citrullin Peptide Ab: 7 units (ref 0–19)

## 2015-12-17 ENCOUNTER — Other Ambulatory Visit: Payer: Self-pay | Admitting: *Deleted

## 2015-12-17 MED ORDER — MELOXICAM 15 MG PO TABS: 15.0000 mg | ORAL_TABLET | Freq: Every day | ORAL | Status: DC

## 2015-12-22 ENCOUNTER — Encounter: Payer: Self-pay | Admitting: Family Medicine

## 2015-12-29 ENCOUNTER — Ambulatory Visit (INDEPENDENT_AMBULATORY_CARE_PROVIDER_SITE_OTHER): Payer: 59 | Admitting: Family Medicine

## 2015-12-29 ENCOUNTER — Encounter: Payer: Self-pay | Admitting: Family Medicine

## 2015-12-29 VITALS — BP 114/76 | HR 78 | Temp 97.9°F | Wt 244.5 lb

## 2015-12-29 DIAGNOSIS — Z309 Encounter for contraceptive management, unspecified: Secondary | ICD-10-CM | POA: Diagnosis not present

## 2015-12-29 MED ORDER — NORETHIN-ETH ESTRAD-FE BIPHAS 1 MG-10 MCG / 10 MCG PO TABS: 1.0000 | ORAL_TABLET | Freq: Every day | ORAL | Status: DC

## 2015-12-29 NOTE — Progress Notes (Signed)
Pre visit review using our clinic review tool, if applicable. No additional management support is needed unless otherwise documented below in the visit note. 

## 2015-12-29 NOTE — Assessment & Plan Note (Signed)
>  15 minutes spent in face to face time with patient, >50% spent in counselling or coordination of care Education given regarding options for contraception, including barrier methods, injectable contraception, IUD placement, oral contraceptives. She would like to try OCPs again- eRx sent for LoLoestrin.  Discussed risks including blood clots and stroke. The patient indicates understanding of these issues and agrees with the plan.

## 2015-12-29 NOTE — Progress Notes (Signed)
Subjective:   Patient ID: Crystal Houston, female    DOB: 1971/02/14, 45 y.o.   MRN: [id number]  Crystal Houston is a pleasant 45 y.o. year old female who presents to clinic today with Contraception  on 12/29/2015  HPI:  She is now sexually active with one partner.  Had IUD removed months ago to help with weight loss and she thinks it is helping. Was receiving IM depo years ago- does not want to try that again as it did cause weight gains.  Periods are now regular, not too heavy.  She is a non smoker.  Current Outpatient Prescriptions on File Prior to Visit  Medication Sig Dispense Refill  . cetirizine (ZYRTEC) 10 MG tablet Take 10 mg by mouth as needed.     . clonazePAM (KLONOPIN) 0.5 MG tablet Take 1 tablet (0.5 mg total) by mouth 3 (three) times daily as needed. Take as needed for anxiety 90 tablet 0  . gabapentin (NEURONTIN) 300 MG capsule Take 3 capsules (900 mg total) by mouth 3 (three) times daily. 810 capsule 3  . ibuprofen (ADVIL,MOTRIN) 200 MG tablet Take 200 mg by mouth every 6 (six) hours as needed.    . meloxicam (MOBIC) 15 MG tablet Take 1 tablet (15 mg total) by mouth daily. 30 tablet 4  . sertraline (ZOLOFT) 50 MG tablet Take 1 tablet by mouth  daily. Ok to increase to 2  tablets by mouth daily in  1-2 weeks if tolerating  well. 180 tablet 1   No current facility-administered medications on file prior to visit.    No Known Allergies  Past Medical History  Diagnosis Date  . Anxiety   . Depression     Past Surgical History  Procedure Laterality Date  . Fracture surgery    . Orif ankle fracture  11/10/2011    Procedure: OPEN REDUCTION INTERNAL FIXATION (ORIF) ANKLE FRACTURE;  Surgeon: Loanne Drilling;  Location: [facility];  Service: Orthopedics;  Laterality: Right;  with screw applicaton  . I&d extremity  11/10/2011    Procedure: IRRIGATION AND DEBRIDEMENT EXTREMITY;  Surgeon: Loanne Drilling;  Location: [facility] OR;  Service: Orthopedics;  Laterality: Right;     Family History  Problem Relation Age of Onset  . Cancer Daughter     osteosarcoma of leg  . Cancer Paternal Grandfather     colon    Social History   Social History  . Marital Status: Married    Spouse Name: N/A  . Number of Children: 3  . 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 Smoking status: Never Smoker   . Smokeless tobacco: Never Used  . Alcohol Use: 1.2 oz/week    2 Glasses of wine per week     Comment: Very rare  . Drug Use: No  . Sexual Activity: No   Other Topics Concern  . Not on file   Social History Narrative   Daughter died in 02-May-2013 of osteosarcoma   The PMH, PSH, Social History, Family History, Medications, and allergies have been reviewed in [facility], and have been updated if relevant.   Review of Systems  Constitutional: Negative.   Eyes: Negative.   Respiratory: Negative.   Endocrine: Negative.   Genitourinary: Negative.   Musculoskeletal: Negative.   Skin: Negative.   Neurological: Negative.   Hematological: Negative.  Does not bruise/bleed easily.  Psychiatric/Behavioral: Negative.   All other systems reviewed  and are negative.      Objective:    BP 114/76 mmHg  Pulse 78  Temp(Src) 97.9 F (36.6 C) (Oral)  Wt 244 lb 8 oz (110.904 kg)  SpO2 97%  LMP 12/02/2015   Physical Exam  Constitutional: She is oriented to person, place, and time. She appears well-developed and well-nourished. No distress.  HENT:  Head: Normocephalic.  Cardiovascular: Normal rate.   Pulmonary/Chest: Effort normal.  Musculoskeletal: Normal range of motion.  Neurological: She is alert and oriented to person, place, and time. No cranial nerve deficit.  Skin: Skin is warm and dry. She is not diaphoretic.  Psychiatric: She has a normal mood and affect. Her behavior is normal. Judgment and thought content normal.  Nursing note and vitals reviewed.         Assessment & Plan:   Encounter for  contraceptive management, unspecified encounter No Follow-up on file.

## 2015-12-30 ENCOUNTER — Encounter: Payer: Self-pay | Admitting: Family Medicine

## 2015-12-30 ENCOUNTER — Other Ambulatory Visit: Payer: Self-pay | Admitting: Family Medicine

## 2015-12-30 MED ORDER — NORETHINDRONE ACET-ETHINYL EST 1-20 MG-MCG PO TABS: 1.0000 | ORAL_TABLET | Freq: Every day | ORAL | Status: DC

## 2016-04-28 ENCOUNTER — Other Ambulatory Visit: Payer: Self-pay | Admitting: Family Medicine

## 2016-05-03 ENCOUNTER — Encounter: Payer: Self-pay | Admitting: Family Medicine

## 2016-05-03 ENCOUNTER — Ambulatory Visit (INDEPENDENT_AMBULATORY_CARE_PROVIDER_SITE_OTHER): Payer: 59 | Admitting: Family Medicine

## 2016-05-03 VITALS — BP 118/82 | HR 82 | Temp 98.3°F | Ht 68.0 in | Wt 243.2 lb

## 2016-05-03 DIAGNOSIS — Z0000 Encounter for general adult medical examination without abnormal findings: Secondary | ICD-10-CM

## 2016-05-03 DIAGNOSIS — R5383 Other fatigue: Secondary | ICD-10-CM | POA: Diagnosis not present

## 2016-05-03 DIAGNOSIS — Z01419 Encounter for gynecological examination (general) (routine) without abnormal findings: Secondary | ICD-10-CM | POA: Insufficient documentation

## 2016-05-03 MED ORDER — GABAPENTIN 300 MG PO CAPS: 900.0000 mg | ORAL_CAPSULE | Freq: Three times a day (TID) | ORAL | Status: DC

## 2016-05-03 NOTE — Assessment & Plan Note (Signed)
Likely due to poor sleep but will check labs to rule out other possible contributing factors.

## 2016-05-03 NOTE — Addendum Note (Signed)
Addended by: Dianne DunARON, TALIA M on: 05/03/2016 09:00 AM   Modules accepted: Orders, SmartSet

## 2016-05-03 NOTE — Progress Notes (Signed)
Subjective:   Patient ID: Crystal Houston, female    DOB: 03/16/1971, 45 y.o.   MRN: [id number]  Crystal Houston is a pleasant 45 y.o. year old female who presents to clinic today with Annual Exam  and follow up of chronic medical conditions on 05/03/2016  HPI:  Mammogram 05/12/15  No h/o abnormal pap smears in past 5 year. Last pap smear 04/27/15  Having more fatigue. Not sleeping well due to her back pain.  Has been getting injections.   Lab Results  Component Value Date   CHOL 199 04/27/2015   HDL 35* 04/27/2015   LDLCALC 137* 04/27/2015   TRIG 136 04/27/2015   CHOLHDL 5.7* 04/27/2015   Lab Results  Component Value Date   CREATININE 0.76 11/18/2015   Lab Results  Component Value Date   TSH 2.750 11/18/2015   Lab Results  Component Value Date   ALT 34* 11/18/2015   AST 21 11/18/2015   ALKPHOS 80 11/18/2015   BILITOT 0.3 11/18/2015   Current Outpatient Prescriptions on File Prior to Visit  Medication Sig Dispense Refill  . cetirizine (ZYRTEC) 10 MG tablet Take 10 mg by mouth as needed.     Marland Kitchen ibuprofen (ADVIL,MOTRIN) 200 MG tablet Take 200 mg by mouth every 6 (six) hours as needed.    . meloxicam (MOBIC) 15 MG tablet Take 1 tablet (15 mg total) by mouth daily. 30 tablet 4  . norethindrone-ethinyl estradiol (MICROGESTIN,JUNEL,LOESTRIN) 1-20 MG-MCG tablet Take 1 tablet by mouth daily. 1 Package 11  . sertraline (ZOLOFT) 50 MG tablet Take 1 tablet by mouth  daily .Ok to increase to 2  tablets by mouth daily in  1-2 weeks if tolerating  well 180 tablet 0   No current facility-administered medications on file prior to visit.    No Known Allergies  Past Medical History  Diagnosis Date  . Anxiety   . Depression     Past Surgical History  Procedure Laterality Date  . Fracture surgery    . Orif ankle fracture  11/10/2011    Procedure: OPEN REDUCTION INTERNAL FIXATION (ORIF) ANKLE FRACTURE;  Surgeon: Loanne Drilling;  Location: [facility];  Service: Orthopedics;   Laterality: Right;  with screw applicaton  . I&d extremity  11/10/2011    Procedure: IRRIGATION AND DEBRIDEMENT EXTREMITY;  Surgeon: Loanne Drilling;  Location: [facility] OR;  Service: Orthopedics;  Laterality: Right;    Family History  Problem Relation Age of Onset  . Cancer Daughter     osteosarcoma of leg  . Cancer Paternal Grandfather     colon    Social History   Social History  . Marital Status: Married    Spouse Name: N/A  . Number of Children: 3  . 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 Smoking status: Never Smoker   . Smokeless tobacco: Never Used  . Alcohol Use: 1.2 oz/week    2 Glasses of wine per week     Comment: Very rare  . Drug Use: No  . Sexual Activity: No   Other Topics Concern  . Not on file   Social History Narrative   Daughter died in 04/28/13 of osteosarcoma   The PMH, PSH, Social History, Family History, Medications, and allergies have been reviewed in [facility], and have been updated if relevant.  Review of Systems  Constitutional: Positive for fatigue.  HENT: Negative.   Respiratory: Negative.  Cardiovascular: Negative.   Gastrointestinal: Negative.   Endocrine: Negative.   Genitourinary: Negative.   Musculoskeletal: Negative.   Skin: Negative.   Allergic/Immunologic: Negative.   Neurological: Negative.   Hematological: Negative.   Psychiatric/Behavioral: Negative.   All other systems reviewed and are negative.      Objective:    BP 118/82 mmHg  Pulse 82  Temp(Src) 98.3 F (36.8 C) (Oral)  Ht 5\' 8"  (1.727 m)  Wt 243 lb 4 oz (110.337 kg)  BMI 36.99 kg/m2  SpO2 96%  LMP 04/01/2016 (Approximate)   Physical Exam   General:  Well-developed,well-nourished,in no acute distress; alert,appropriate and cooperative throughout examination Head:  normocephalic and atraumatic.   Eyes:  vision grossly intact, pupils equal, pupils round, and pupils reactive to light.   Ears:  R ear  normal and L ear normal.   Nose:  no external deformity.   Mouth:  good dentition.   Neck:  No deformities, masses, or tenderness noted. Breasts:  No mass, nodules, thickening, tenderness, bulging, retraction, inflamation, nipple discharge or skin changes noted.   Lungs:  Normal respiratory effort, chest expands symmetrically. Lungs are clear to auscultation, no crackles or wheezes. Heart:  Normal rate and regular rhythm. S1 and S2 normal without gallop, murmur, click, rub or other extra sounds. Abdomen:  Bowel sounds positive,abdomen soft and non-tender without masses, organomegaly or hernias noted. Rectal:  no external abnormalities.   Genitalia:  Pelvic Exam:        External: normal female genitalia without lesions or masses        Vagina: normal without lesions or masses        Cervix: normal without lesions or masses        Adnexa: normal bimanual exam without masses or fullness        Uterus: normal by palpation        Pap smear: performed Msk:  No deformity or scoliosis noted of thoracic or lumbar spine.   Extremities:  No clubbing, cyanosis, edema, or deformity noted with normal full range of motion of all joints.   Neurologic:  alert & oriented X3 and gait normal.   Skin:  Intact without suspicious lesions or rashes Cervical Nodes:  No lymphadenopathy noted Axillary Nodes:  No palpable lymphadenopathy Psych:  Cognition and judgment appear intact. Alert and cooperative with normal attention span and concentration. No apparent delusions, illusions, hallucinations       Assessment & Plan:   Well woman exam No Follow-up on file.

## 2016-05-03 NOTE — Addendum Note (Signed)
Addended by: Desmond DikeKNIGHT, Shanon Seawright H on: 05/03/2016 08:59 AM   Modules accepted: Orders

## 2016-05-03 NOTE — Assessment & Plan Note (Addendum)
Reviewed preventive care protocols, scheduled due services, and updated immunizations Discussed nutrition, exercise, diet, and healthy lifestyle.  Discussed USPSTF recommendations of cervical cancer screening.  She is aware that interval of 3 years is recommended but pt would prefer to have pap smear done today.  Orders Placed This Encounter  Procedures  . CBC with Differential/Platelet  . Comprehensive metabolic panel  . Lipid panel  . TSH  . Vitamin D, 25-hydroxy  . Vitamin B12

## 2016-05-03 NOTE — Addendum Note (Signed)
Addended by: CHAVERS, NATASHA C on: 05/03/2016 02:48 PM   Modules accepted: SmartSet  

## 2016-05-03 NOTE — Patient Instructions (Signed)
Good to see you.  We will call you with your results and you can view them online. 

## 2016-05-03 NOTE — Addendum Note (Signed)
Addended by: Margurette Brener C on: 05/03/2016 02:47 PM   Modules accepted: SmartSet  

## 2016-05-03 NOTE — Addendum Note (Signed)
Addended by: Josph MachoANCE, Dayzee Trower A on: 05/03/2016 09:05 AM   Modules accepted: Orders

## 2016-05-03 NOTE — Progress Notes (Signed)
Pre visit review using our clinic review tool, if applicable. No additional management support is needed unless otherwise documented below in the visit note. 

## 2016-05-03 NOTE — Addendum Note (Signed)
Addended by: Dianne DunARON, Irfan Veal M on: 05/03/2016 08:58 AM   Modules accepted: Orders, SmartSet

## 2016-05-04 LAB — COMPREHENSIVE METABOLIC PANEL
ALBUMIN: 3.8 g/dL (ref 3.5–5.5)
ALK PHOS: 63 IU/L (ref 39–117)
ALT: 13 IU/L (ref 0–32)
AST: 16 IU/L (ref 0–40)
Albumin/Globulin Ratio: 1.3 (ref 1.2–2.2)
BUN / CREAT RATIO: 17 (ref 9–23)
BUN: 15 mg/dL (ref 6–24)
Bilirubin Total: 0.2 mg/dL (ref 0.0–1.2)
CO2: 22 mmol/L (ref 18–29)
CREATININE: 0.86 mg/dL (ref 0.57–1.00)
Calcium: 8.8 mg/dL (ref 8.7–10.2)
Chloride: 101 mmol/L (ref 96–106)
GFR calc non Af Amer: 82 mL/min/{1.73_m2} (ref >59)
GFR, EST AFRICAN AMERICAN: 94 mL/min/{1.73_m2} (ref >59)
GLOBULIN, TOTAL: 3 g/dL (ref 1.5–4.5)
Glucose: 90 mg/dL (ref 65–99)
Potassium: 4.8 mmol/L (ref 3.5–5.2)
SODIUM: 137 mmol/L (ref 134–144)
TOTAL PROTEIN: 6.8 g/dL (ref 6.0–8.5)

## 2016-05-04 LAB — LIPID PANEL
CHOLESTEROL TOTAL: 199 mg/dL (ref 100–199)
Chol/HDL Ratio: 4.2 ratio units (ref 0.0–4.4)
HDL: 47 mg/dL (ref >39)
LDL CALC: 119 mg/dL — AB (ref 0–99)
Triglycerides: 164 mg/dL — ABNORMAL HIGH (ref 0–149)
VLDL CHOLESTEROL CAL: 33 mg/dL (ref 5–40)

## 2016-05-04 LAB — CBC WITH DIFFERENTIAL/PLATELET
BASOS: 1 %
Basophils Absolute: 0 10*3/uL (ref 0.0–0.2)
EOS (ABSOLUTE): 0.2 10*3/uL (ref 0.0–0.4)
Eos: 3 %
HEMATOCRIT: 40.6 % (ref 34.0–46.6)
HEMOGLOBIN: 13.3 g/dL (ref 11.1–15.9)
IMMATURE GRANS (ABS): 0 10*3/uL (ref 0.0–0.1)
Immature Granulocytes: 0 %
LYMPHS ABS: 1.4 10*3/uL (ref 0.7–3.1)
Lymphs: 20 %
MCH: 28.4 pg (ref 26.6–33.0)
MCHC: 32.8 g/dL (ref 31.5–35.7)
MCV: 87 fL (ref 79–97)
MONOS ABS: 0.5 10*3/uL (ref 0.1–0.9)
Monocytes: 7 %
NEUTROS PCT: 69 %
Neutrophils Absolute: 4.6 10*3/uL (ref 1.4–7.0)
Platelets: 390 10*3/uL — ABNORMAL HIGH (ref 150–379)
RBC: 4.68 x10E6/uL (ref 3.77–5.28)
RDW: 12.8 % (ref 12.3–15.4)
WBC: 6.7 10*3/uL (ref 3.4–10.8)

## 2016-05-04 LAB — TSH: TSH: 2.46 u[IU]/mL (ref 0.450–4.500)

## 2016-05-04 LAB — VITAMIN D 25 HYDROXY (VIT D DEFICIENCY, FRACTURES): VIT D 25 HYDROXY: 20.2 ng/mL — AB (ref 30.0–100.0)

## 2016-05-04 LAB — VITAMIN B12: VITAMIN B 12: 359 pg/mL (ref 211–946)

## 2016-05-04 MED ORDER — VITAMIN D (ERGOCALCIFEROL) 1.25 MG (50000 UNIT) PO CAPS: 50000.0000 [IU] | ORAL_CAPSULE | ORAL | Status: DC

## 2016-05-04 NOTE — Addendum Note (Signed)
Addended by: Desmond DikeKNIGHT, Larico Dimock H on: 05/04/2016 08:59 AM   Modules accepted: Orders

## 2016-05-06 ENCOUNTER — Other Ambulatory Visit: Payer: Self-pay | Admitting: Family Medicine

## 2016-05-06 ENCOUNTER — Encounter: Payer: Self-pay | Admitting: Family Medicine

## 2016-05-06 ENCOUNTER — Encounter: Payer: Self-pay | Admitting: *Deleted

## 2016-05-06 LAB — PAP LB (LIQUID-BASED): PAP Smear Comment: 0

## 2016-05-06 MED ORDER — FLUCONAZOLE 150 MG PO TABS: 150.0000 mg | ORAL_TABLET | Freq: Every day | ORAL | Status: DC

## 2016-05-06 MED ORDER — VITAMIN D (ERGOCALCIFEROL) 1.25 MG (50000 UNIT) PO CAPS
50000.0000 [IU] | ORAL_CAPSULE | ORAL | Status: DC
Start: 2016-05-06 — End: 2017-05-30

## 2016-05-24 ENCOUNTER — Other Ambulatory Visit: Payer: Self-pay | Admitting: Family Medicine

## 2016-05-24 DIAGNOSIS — Z1231 Encounter for screening mammogram for malignant neoplasm of breast: Secondary | ICD-10-CM

## 2016-06-09 ENCOUNTER — Ambulatory Visit
Admission: RE | Admit: 2016-06-09 | Discharge: 2016-06-09 | Disposition: A | Discharge status: Home / Self Care | Payer: 59 | Source: Ambulatory Visit | Attending: Family Medicine | Admitting: Family Medicine

## 2016-06-09 ENCOUNTER — Other Ambulatory Visit: Payer: Self-pay | Admitting: Family Medicine

## 2016-06-09 DIAGNOSIS — Z1231 Encounter for screening mammogram for malignant neoplasm of breast: Secondary | ICD-10-CM

## 2016-06-27 ENCOUNTER — Other Ambulatory Visit: Payer: Self-pay | Admitting: *Deleted

## 2016-06-27 MED ORDER — MELOXICAM 15 MG PO TABS: 15.0000 mg | ORAL_TABLET | Freq: Every day | ORAL | 2 refills | Status: DC

## 2016-07-17 ENCOUNTER — Other Ambulatory Visit: Payer: Self-pay | Admitting: Family Medicine

## 2016-07-22 MED ORDER — SERTRALINE HCL 100 MG PO TABS: 100.0000 mg | ORAL_TABLET | Freq: Every day | ORAL | 2 refills | Status: DC

## 2016-07-22 NOTE — Telephone Encounter (Signed)
Pt states she is taking Zoloft 50mg  2 tabs daily. She states that per her ins company the med will cost her 50% less if it is changed to 100mg  tab to take once daily, and she request to have that sent in. I will send in new rx.

## 2016-07-22 NOTE — Telephone Encounter (Signed)
Spoke with [facility] pharmacist regard specific directions on medication. sertraline (ZOLOFT) 50 MG tablet 180 tablet 0 07/19/2016    Sig: Take 1 tablet by mouth daily .Ok to increase to 2 tablets by mouth daily in 1-2 weeks if tolerating well   E-Prescribing Status: Receipt confirmed by pharmacy (07/19/2016 11:19 AM EDT    Pharmacist states that patient has already been on this medication since 2016 and prescription was written the same at that time.   Has patient been on 2 tablets daily?  If so, please call and confirm with the [facility] pharmacy.  They state they have previously faxed 2 times, this is their final attempt.   [phone] reference [phone]#[id number]

## 2016-07-22 NOTE — Addendum Note (Signed)
Addended by: Baldomero LamyHAVERS, Fenna Semel C on: 07/22/2016 01:54 PM   Modules accepted: Orders

## 2016-07-22 NOTE — Telephone Encounter (Signed)
Please call pt to verify how she is taking.  Ok to refill based on how she is taking this.

## 2016-08-10 ENCOUNTER — Other Ambulatory Visit: Payer: Self-pay | Admitting: Family Medicine

## 2016-09-19 ENCOUNTER — Ambulatory Visit: Payer: Self-pay | Admitting: Family Medicine

## 2016-09-30 ENCOUNTER — Encounter: Payer: Self-pay | Admitting: Family Medicine

## 2016-11-30 ENCOUNTER — Other Ambulatory Visit: Payer: Self-pay | Admitting: Family Medicine

## 2016-12-22 DIAGNOSIS — R05 Cough: Secondary | ICD-10-CM | POA: Diagnosis not present

## 2016-12-27 DIAGNOSIS — M25472 Effusion, left ankle: Secondary | ICD-10-CM | POA: Diagnosis not present

## 2016-12-27 DIAGNOSIS — M25471 Effusion, right ankle: Secondary | ICD-10-CM | POA: Diagnosis not present

## 2016-12-27 DIAGNOSIS — M6790 Unspecified disorder of synovium and tendon, unspecified site: Secondary | ICD-10-CM | POA: Diagnosis not present

## 2017-01-17 DIAGNOSIS — M4690 Unspecified inflammatory spondylopathy, site unspecified: Secondary | ICD-10-CM | POA: Diagnosis not present

## 2017-01-17 DIAGNOSIS — M25472 Effusion, left ankle: Secondary | ICD-10-CM | POA: Diagnosis not present

## 2017-01-19 DIAGNOSIS — J301 Allergic rhinitis due to pollen: Secondary | ICD-10-CM | POA: Diagnosis not present

## 2017-02-06 DIAGNOSIS — M7061 Trochanteric bursitis, right hip: Secondary | ICD-10-CM | POA: Diagnosis not present

## 2017-02-28 DIAGNOSIS — M4690 Unspecified inflammatory spondylopathy, site unspecified: Secondary | ICD-10-CM | POA: Diagnosis not present

## 2017-03-22 DIAGNOSIS — H40003 Preglaucoma, unspecified, bilateral: Secondary | ICD-10-CM | POA: Diagnosis not present

## 2017-03-30 ENCOUNTER — Other Ambulatory Visit: Payer: Self-pay | Admitting: Family Medicine

## 2017-04-13 DIAGNOSIS — H40003 Preglaucoma, unspecified, bilateral: Secondary | ICD-10-CM | POA: Diagnosis not present

## 2017-04-15 ENCOUNTER — Encounter: Payer: Self-pay | Admitting: Cardiovascular Disease

## 2017-04-18 DIAGNOSIS — M4690 Unspecified inflammatory spondylopathy, site unspecified: Secondary | ICD-10-CM | POA: Diagnosis not present

## 2017-04-18 DIAGNOSIS — M456 Ankylosing spondylitis lumbar region: Secondary | ICD-10-CM | POA: Diagnosis not present

## 2017-04-27 DIAGNOSIS — M5137 Other intervertebral disc degeneration, lumbosacral region: Secondary | ICD-10-CM | POA: Diagnosis not present

## 2017-04-27 DIAGNOSIS — M545 Low back pain: Secondary | ICD-10-CM | POA: Diagnosis not present

## 2017-04-27 DIAGNOSIS — M47817 Spondylosis without myelopathy or radiculopathy, lumbosacral region: Secondary | ICD-10-CM | POA: Diagnosis not present

## 2017-05-05 ENCOUNTER — Telehealth: Payer: Self-pay

## 2017-05-05 MED ORDER — CLONAZEPAM 0.5 MG PO TABS: 0.5000 mg | ORAL_TABLET | Freq: Three times a day (TID) | ORAL | 0 refills | Status: DC | PRN

## 2017-05-05 NOTE — Addendum Note (Signed)
Addended by: Barrington EllisonWAGONER, Coner Gibbard C on: 05/05/2017 05:25 PM   Modules accepted: Orders

## 2017-05-05 NOTE — Telephone Encounter (Signed)
Patient calls stating, "my nerves are a mess and I need some help from Dr. Dayton MartesAron".  She is noticeably distraught on the phone and appears to be crying.    Patient is requesting a refill on her clonazepam.    Last OV 05/03/16 - clonazepam d/c'ed due to rare use.    Last fill in history for #90 in 2016.  I do not see where she has had since.    Please advise if okay to send in a supply and I assume she will need appointment with you to follow up in the very near future.  Thanks.

## 2017-05-05 NOTE — Telephone Encounter (Signed)
Yes ok to send in 30 day supply of clonazepam and schedule a 30 min OV with me next week. Thank you.

## 2017-05-05 NOTE — Telephone Encounter (Addendum)
Noted.  R/X phoned in for #30 pills to [facility] on [address] per patient request and patient notified.   Appointment made for 05/11/17 at 8:00am.

## 2017-05-05 NOTE — Telephone Encounter (Signed)
I am unsure if Dr. Dayton MartesAron will see this before 5pm today and patient very anxious for a response.  Will forward to R. Baity NP for advice.  Thanks.

## 2017-05-08 DIAGNOSIS — L400 Psoriasis vulgaris: Secondary | ICD-10-CM | POA: Diagnosis not present

## 2017-05-08 DIAGNOSIS — L821 Other seborrheic keratosis: Secondary | ICD-10-CM | POA: Diagnosis not present

## 2017-05-11 ENCOUNTER — Ambulatory Visit (INDEPENDENT_AMBULATORY_CARE_PROVIDER_SITE_OTHER): Payer: 59 | Admitting: Family Medicine

## 2017-05-11 ENCOUNTER — Encounter: Payer: Self-pay | Admitting: Family Medicine

## 2017-05-11 DIAGNOSIS — F418 Other specified anxiety disorders: Secondary | ICD-10-CM | POA: Diagnosis not present

## 2017-05-11 NOTE — Assessment & Plan Note (Signed)
Deteriorated. >25 minutes spent in face to face time with patient, >50% spent in counselling or coordination of care We agreed to use prn klonipin for now as she is feeling better. At this time, she does not want to restart an SSRI. Call or return to clinic prn if these symptoms worsen or fail to improve as anticipated. The patient indicates understanding of these issues and agrees with the plan.

## 2017-05-11 NOTE — Progress Notes (Signed)
Subjective:   Patient ID: Crystal Houston, female    DOB: [birth year], 46 y.o.   MRN: [id number]  MARYMARGARET Houston is a pleasant 46 y.o. year old female who presents to clinic today with Anxiety  on 05/11/2017  HPI:  Anxiety- deteriorated. She was taking zoloft 100 mg daily but stopped taking it a few days.  "It made me feel drugged."  Called office to get refill of Klonipin last week (note reviewed from Syliva Overman, RN)-   Patient calls stating, "my nerves are a mess and I need some help from [name]".  She is noticeably distraught on the phone and appears to be crying.    Patient is requesting a refill on her clonazepam.    Last OV 05/03/16 - clonazepam d/c'ed due to rare use.    Last fill in history for #90 in 2016.  I do not see where she has had since.    Please advise if okay to send in a supply and I assume she will need appointment with you to follow up in the very near future.  Thanks.   She feels better now.  Was going through some acute stressors with her teenage daughter.  Current Outpatient Prescriptions on File Prior to Visit  Medication Sig Dispense Refill  . cetirizine (ZYRTEC) 10 MG tablet Take 10 mg by mouth as needed.     . clonazePAM (KLONOPIN) 0.5 MG tablet Take 1 tablet (0.5 mg total) by mouth 3 (three) times daily as needed for anxiety. 30 tablet 0  . fluconazole (DIFLUCAN) 150 MG tablet Take 1 tablet (150 mg total) by mouth daily. 1 tablet 0  . gabapentin (NEURONTIN) 300 MG capsule Take 3 capsules (900 mg total) by mouth 3 (three) times daily. 810 capsule 3  . ibuprofen (ADVIL,MOTRIN) 200 MG tablet Take 200 mg by mouth every 6 (six) hours as needed.    . norethindrone-ethinyl estradiol (MICROGESTIN,JUNEL,LOESTRIN) 1-20 MG-MCG tablet Take 1 tablet by mouth daily. 1 Package 11  . valACYclovir (VALTREX) 1000 MG tablet TAKE 1 TABLET (1,000 MG TOTAL) BY MOUTH 2 (TWO) TIMES DAILY. 20 tablet 1  . Vitamin D, Ergocalciferol, (DRISDOL) 50000 units CAPS  capsule Take 1 capsule (50,000 Units total) by mouth every 7 (seven) days. 6 capsule 0   No current facility-administered medications on file prior to visit.     No Known Allergies  Past Medical History:  Diagnosis Date  . Anxiety   . Depression     Past Surgical History:  Procedure Laterality Date  . FRACTURE SURGERY    . I&D EXTREMITY  11/10/2011   Procedure: IRRIGATION AND DEBRIDEMENT EXTREMITY;  Surgeon: Loanne Drilling;  Location: [facility] OR;  Service: Orthopedics;  Laterality: Right;  . ORIF ANKLE FRACTURE  11/10/2011   Procedure: OPEN REDUCTION INTERNAL FIXATION (ORIF) ANKLE FRACTURE;  Surgeon: Loanne Drilling;  Location: [facility] OR;  Service: Orthopedics;  Laterality: Right;  with screw applicaton    Family History  Problem Relation Age of Onset  . Cancer Daughter        osteosarcoma of leg  . Cancer Paternal Grandfather        colon    Social History   Social History  . Marital status: Married    Spouse name: N/A  . Number of children: 3  . 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 Smoking status: Never Smoker  . Smokeless tobacco:  Never Used  . Alcohol use 1.2 oz/week    2 Glasses of wine per week     Comment: Very rare  . Drug use: No  . Sexual activity: No   Other Topics Concern  . Not on file   Social History Narrative   Daughter died in 2014 of osteosarcoma   The PMH, PSH, Social History, Family History, Medications, and [facility], and have been updated if relevant.   Review of Systems  Constitutional: Negative.   Psychiatric/Behavioral: Negative for agitation, behavioral problems, confusion, decreased concentration, dysphoric mood, hallucinations, self-injury, sleep disturbance and suicidal ideas. The patient is nervous/anxious. The patient is not hyperactive.   All other systems reviewed and are negative.      Objective:    BP 120/80   Pulse 80   Ht 5\' 8"   (1.727 m)   Wt 247 lb (112 kg)   SpO2 100%   BMI 37.56 kg/m    Physical Exam  Constitutional: She is oriented to person, place, and time. She appears well-developed and well-nourished. No distress.  HENT:  Head: Normocephalic and atraumatic.  Eyes: Conjunctivae are normal.  Cardiovascular: Normal rate.   Pulmonary/Chest: Effort normal.  Neurological: She is alert and oriented to person, place, and time. No cranial nerve deficit.  Skin: Skin is warm and dry. She is not diaphoretic.  Psychiatric: She has a normal mood and affect. Her behavior is normal. Judgment and thought content normal.  Nursing note and vitals reviewed.         Assessment & Plan:   Depression with anxiety No Follow-up on file.

## 2017-05-11 NOTE — Patient Instructions (Signed)
Great to see you.  Have a safe trip.  Please keep me updated.

## 2017-05-12 ENCOUNTER — Other Ambulatory Visit: Payer: Self-pay | Admitting: Family Medicine

## 2017-05-12 ENCOUNTER — Encounter: Payer: Self-pay | Admitting: Family Medicine

## 2017-05-12 DIAGNOSIS — R0683 Snoring: Secondary | ICD-10-CM

## 2017-05-30 ENCOUNTER — Encounter: Payer: Self-pay | Admitting: Internal Medicine

## 2017-05-30 ENCOUNTER — Ambulatory Visit (INDEPENDENT_AMBULATORY_CARE_PROVIDER_SITE_OTHER): Payer: 59 | Admitting: Internal Medicine

## 2017-05-30 VITALS — BP 118/88 | HR 91 | Ht 68.0 in | Wt 252.0 lb

## 2017-05-30 DIAGNOSIS — G4719 Other hypersomnia: Secondary | ICD-10-CM | POA: Diagnosis not present

## 2017-05-30 NOTE — Progress Notes (Signed)
[facility] [place] Pulmonary Medicine Consultation      Assessment and Plan:  Excessive daytime sleepiness. -Symptoms and signs of obstructive sleep apnea, we'll send for sleep study.  Anxiety/depression. -Obstructive sleep apnea can contribute anxiety and depression, therefore, is important treat sleep apnea if present.  Date: 05/30/2017  MRN# [id number] Crystal Houston 1971-02-17    Crystal Houston is a 46 y.o. old female seen in consultation for chief complaint of:    Chief Complaint  Patient presents with  . Advice Only    sleep consult: ref by Dayton Martes: daytime sleepiness: snoring;gaasp for air;     HPI:   The patient is a 46 year old female who comes in today with complaints of being exhausted, snoring, gasping for air. She typically goes to bed between 11 or 12 at night. She falls asleep usually within 30 minutes, she wakes up 0 or 1 times per night. She often wakes up at 8:30, though she would like to wake up at 6:30, but cannot because of fatigue. Her Epworth score is 12 today. She is present with her fiance today who notes that she stops breathing, snores and gasps per breath. She is "exhausted" much of the day, she is ready to sleep by the time she gets home after work. This has been going on for several years.  No sleep attacks, no paralysis.      PMHX:   Past Medical History:  Diagnosis Date  . Anxiety   . Depression    Surgical Hx:  Past Surgical History:  Procedure Laterality Date  . FRACTURE SURGERY    . I&D EXTREMITY  11/10/2011   Procedure: IRRIGATION AND DEBRIDEMENT EXTREMITY;  Surgeon: Loanne Drilling;  Location: [facility] OR;  Service: Orthopedics;  Laterality: Right;  . ORIF ANKLE FRACTURE  11/10/2011   Procedure: OPEN REDUCTION INTERNAL FIXATION (ORIF) ANKLE FRACTURE;  Surgeon: Loanne Drilling;  Location: [facility] OR;  Service: Orthopedics;  Laterality: Right;  with screw applicaton   Family Hx:  Family History  Problem Relation Age of Onset  . Cancer Daughter        osteosarcoma of leg  . Cancer Paternal Grandfather        colon   Social Hx:   Social History  Substance Use Topics  . Smoking status: Never Smoker  . Smokeless tobacco: Never Used  . Alcohol use 1.2 oz/week    2 Glasses of wine per week     Comment: Very rare   Medication:    Current Outpatient Prescriptions:  .  Adalimumab (HUMIRA) 10 MG/0.1ML PSKT, Inject into the skin. Every two weeks, Disp: , Rfl:  .  celecoxib (CELEBREX) 200 MG capsule, Take 200 mg by mouth 2 (two) times daily., Disp: , Rfl:  .  cetirizine (ZYRTEC) 10 MG tablet, Take 10 mg by mouth as needed. , Disp: , Rfl:  .  clonazePAM (KLONOPIN) 0.5 MG tablet, Take 1 tablet (0.5 mg total) by mouth 3 (three) times daily as needed for anxiety., Disp: 30 tablet, Rfl: 0 .  gabapentin (NEURONTIN) 300 MG capsule, Take 3 capsules (900 mg total) by mouth 3 (three) times daily., Disp: 810 capsule, Rfl: 3 .  ibuprofen (ADVIL,MOTRIN) 200 MG tablet, Take 200 mg by mouth every 6 (six) hours as needed., Disp: , Rfl:  .  methotrexate (RHEUMATREX) 2.5 MG tablet, Take 2.5 mg by mouth daily. Takes 6 tablets po once a week, Disp: , Rfl:    Allergies:  Patient has no known allergies.  Review of  Systems: Gen:  Denies  fever, sweats, chills HEENT: Denies blurred vision, double vision. bleeds, sore throat Cvc:  No dizziness, chest pain. Resp:   Denies cough or sputum production, shortness of breath Gi: Denies swallowing difficulty, stomach pain. Gu:  Denies bladder incontinence, burning urine Ext:   No Joint pain, stiffness. Skin: No skin rash,  hives  Endoc:  No polyuria, polydipsia. Psych: No depression, insomnia. Other:  All other systems were reviewed with the patient and were negative other that what is mentioned in the HPI.   Physical Examination:   VS: BP 118/88 (BP Location: Left Arm, Cuff Size: Normal)   Pulse 91   Ht 5\' 8"  (1.727 m)   Wt 252 lb (114.3 kg)   SpO2 99%   BMI 38.32 kg/m   General Appearance: No distress   Neuro:without focal findings,  speech normal,  HEENT: PERRLA, EOM intact.   Pulmonary: normal breath sounds, No wheezing.  CardiovascularNormal S1,S2.  No m/r/g.   Abdomen: Benign, Soft, non-tender. Renal:  No costovertebral tenderness  GU:  No performed at this time. Endoc: No evident thyromegaly, no signs of acromegaly. Skin:   warm, no rashes, no ecchymosis  Extremities: normal, no cyanosis, clubbing.  Other findings:    LABORATORY PANEL:   CBC No results for input(s): WBC, HGB, HCT, PLT in the last 168 hours. ------------------------------------------------------------------------------------------------------------------  Chemistries  No results for input(s): NA, K, CL, CO2, GLUCOSE, BUN, CREATININE, CALCIUM, MG, AST, ALT, ALKPHOS, BILITOT in the last 168 hours.  Invalid input(s): GFRCGP ------------------------------------------------------------------------------------------------------------------  Cardiac Enzymes No results for input(s): TROPONINI in the last 168 hours. ------------------------------------------------------------  RADIOLOGY:  No results found.     Thank  you for the consultation and for allowing [facility] to assist in the care of your patient. Our recommendations are noted above.  Please contact us if we can be of further service.   Wells Guileseep Joren Rehm, MD.  Board Certified in Internal Medicine, Pulmonary Medicine, Critical Care Medicine, and Sleep Medicine.  [place] [facility] Office Number: [phone]  Santiago Gladavid Kasa, M.D.  Billy Fischeravid Simonds, M.D  05/30/2017

## 2017-05-30 NOTE — Patient Instructions (Signed)
  Sleep Apnea Sleep apnea is disorder that affects a person's sleep. A person with sleep apnea has abnormal pauses in their breathing when they sleep. It is hard for them to get a good sleep. This makes a person tired during the day. It also can lead to other physical problems. There are three types of sleep apnea. One type is when breathing stops for a short time because your airway is blocked (obstructive sleep apnea). Another type is when the brain sometimes fails to give the normal signal to breathe to the muscles that control your breathing (central sleep apnea). The third type is a combination of the other two types. HOME CARE   Take all medicine as told by your doctor.  Avoid alcohol, calming medicines (sedatives), and depressant drugs.  Try to lose weight if you are overweight. Talk to your doctor about a healthy weight goal.  Your doctor may have you use a device that helps to open your airway. It can help you get the air that you need. It is called a positive airway pressure (PAP) device.   MAKE SURE YOU:   Understand these instructions.  Will watch your condition.  Will get help right away if you are not doing well or get worse.  It may take approximately 1 month for you to get used to wearing her CPAP every night.  Be sure to work with your machine to get used to it, be patient, it may take time! 

## 2017-06-05 ENCOUNTER — Telehealth: Payer: Self-pay | Admitting: Internal Medicine

## 2017-06-05 NOTE — Telephone Encounter (Signed)
Please advise on message below Thanks

## 2017-06-05 NOTE — Telephone Encounter (Signed)
Pt would like to know when we will be calling her insurance to "pre cert" her home sleep study She called her insurance and they have not heard from us yet She is just checking on this Please call back

## 2017-06-25 ENCOUNTER — Encounter: Payer: Self-pay | Admitting: Internal Medicine

## 2017-06-25 DIAGNOSIS — G4719 Other hypersomnia: Secondary | ICD-10-CM

## 2017-06-26 DIAGNOSIS — G4733 Obstructive sleep apnea (adult) (pediatric): Secondary | ICD-10-CM | POA: Diagnosis not present

## 2017-06-28 ENCOUNTER — Telehealth: Payer: Self-pay | Admitting: *Deleted

## 2017-06-28 DIAGNOSIS — G4733 Obstructive sleep apnea (adult) (pediatric): Secondary | ICD-10-CM

## 2017-06-28 NOTE — Telephone Encounter (Signed)
Pt informed of sleep study results. Order placed for CPAP. Nothing further needed. 

## 2017-07-10 DIAGNOSIS — G4733 Obstructive sleep apnea (adult) (pediatric): Secondary | ICD-10-CM | POA: Diagnosis not present

## 2017-07-10 DIAGNOSIS — S82309A Unspecified fracture of lower end of unspecified tibia, initial encounter for closed fracture: Secondary | ICD-10-CM | POA: Diagnosis not present

## 2017-07-12 DIAGNOSIS — L4050 Arthropathic psoriasis, unspecified: Secondary | ICD-10-CM | POA: Diagnosis not present

## 2017-07-12 DIAGNOSIS — M4690 Unspecified inflammatory spondylopathy, site unspecified: Secondary | ICD-10-CM | POA: Diagnosis not present

## 2017-07-26 ENCOUNTER — Other Ambulatory Visit: Payer: Self-pay | Admitting: Family Medicine

## 2017-07-28 NOTE — Telephone Encounter (Signed)
Last refill 05/03/16 #810 + 3  Last OV 05/11/17  Ok to refill?

## 2017-08-10 ENCOUNTER — Ambulatory Visit (INDEPENDENT_AMBULATORY_CARE_PROVIDER_SITE_OTHER): Payer: 59 | Admitting: Family Medicine

## 2017-08-10 ENCOUNTER — Encounter: Payer: Self-pay | Admitting: Family Medicine

## 2017-08-10 VITALS — BP 120/82 | HR 79 | Temp 98.0°F | Resp 16 | Ht 68.0 in | Wt 255.1 lb

## 2017-08-10 DIAGNOSIS — L4050 Arthropathic psoriasis, unspecified: Secondary | ICD-10-CM | POA: Insufficient documentation

## 2017-08-10 DIAGNOSIS — S82309A Unspecified fracture of lower end of unspecified tibia, initial encounter for closed fracture: Secondary | ICD-10-CM | POA: Diagnosis not present

## 2017-08-10 DIAGNOSIS — Z1239 Encounter for other screening for malignant neoplasm of breast: Secondary | ICD-10-CM

## 2017-08-10 DIAGNOSIS — Z124 Encounter for screening for malignant neoplasm of cervix: Secondary | ICD-10-CM | POA: Diagnosis not present

## 2017-08-10 DIAGNOSIS — G4733 Obstructive sleep apnea (adult) (pediatric): Secondary | ICD-10-CM | POA: Insufficient documentation

## 2017-08-10 DIAGNOSIS — Z9989 Dependence on other enabling machines and devices: Secondary | ICD-10-CM | POA: Insufficient documentation

## 2017-08-10 DIAGNOSIS — Z01419 Encounter for gynecological examination (general) (routine) without abnormal findings: Secondary | ICD-10-CM | POA: Diagnosis not present

## 2017-08-10 DIAGNOSIS — Z23 Encounter for immunization: Secondary | ICD-10-CM

## 2017-08-10 DIAGNOSIS — Z1231 Encounter for screening mammogram for malignant neoplasm of breast: Secondary | ICD-10-CM | POA: Diagnosis not present

## 2017-08-10 HISTORY — DX: Obstructive sleep apnea (adult) (pediatric): G47.33

## 2017-08-10 NOTE — Addendum Note (Signed)
Addended by: Gregery NaVALENCIA, Kyel Purk P on: 08/10/2017 02:31 PM   Modules accepted: Orders

## 2017-08-10 NOTE — Addendum Note (Signed)
Addended by: Osa CraverGUIJOZA, Krina Mraz M on: 08/10/2017 09:23 AM   Modules accepted: Orders

## 2017-08-10 NOTE — Addendum Note (Signed)
Addended by: Alvina ChouWALSH, Adrianah Prophete J on: 08/10/2017 11:03 AM   Modules accepted: Orders

## 2017-08-10 NOTE — Assessment & Plan Note (Signed)
Reviewed preventive care protocols, scheduled due services, and updated immunizations Discussed nutrition, exercise, diet, and healthy lifestyle.  Pap smear done today.  Pt to schedule mammogram.  Orders Placed This Encounter  Procedures  . MM Digital Screening  . CBC with Differential/Platelet  . Comprehensive metabolic panel  . Lipid panel  . TSH

## 2017-08-10 NOTE — Addendum Note (Signed)
Addended by: Dianne DunARON, Zenobia Kuennen M on: 08/10/2017 09:17 AM   Modules accepted: Orders

## 2017-08-10 NOTE — Assessment & Plan Note (Signed)
Followed by rheumatology. On Cimzia.

## 2017-08-10 NOTE — Patient Instructions (Signed)
Good to see you. We will call you with your lab results and you can see them online.  Please call [facility] to schedule your mammogram.

## 2017-08-10 NOTE — Addendum Note (Signed)
Addended by: Osa CraverGUIJOZA, Jadore Mcguffin M on: 08/10/2017 10:52 AM   Modules accepted: Orders

## 2017-08-10 NOTE — Addendum Note (Signed)
Addended by: Alvina ChouWALSH, Sidni Fusco J on: 08/10/2017 09:59 AM   Modules accepted: Orders

## 2017-08-10 NOTE — Progress Notes (Signed)
Subjective:   Patient ID: Crystal CooperWendolyn M Spiering, female    DOB: 06-22-1971, 46 y.o.   MRN: [id number]  Crystal Houston is a pleasant 46 y.o. year old female who presents to clinic today with Annual Exam  and follow up of chronic medical conditions on 08/10/2017  HPI:  Mammogram 06/09/16  No h/o abnormal pap smears in past 5 year.  Psoriatic arthritis- followed by rheumatology, Dr. Lendon ColonelHawks. Was on MTX and Humira.  Now on cimzia and MTX.  Feels pain is much better.  OSA- now on CPAP.  Lab Results  Component Value Date   CHOL 199 05/03/2016   HDL 47 05/03/2016   LDLCALC 119 (H) 05/03/2016   TRIG 164 (H) 05/03/2016   CHOLHDL 4.2 05/03/2016   Lab Results  Component Value Date   CREATININE 0.86 05/03/2016   Lab Results  Component Value Date   TSH 2.460 05/03/2016   Lab Results  Component Value Date   ALT 13 05/03/2016   AST 16 05/03/2016   ALKPHOS 63 05/03/2016   BILITOT 0.2 05/03/2016   Current Outpatient Prescriptions on File Prior to Visit  Medication Sig Dispense Refill  . celecoxib (CELEBREX) 200 MG capsule Take 200 mg by mouth 2 (two) times daily.    . cetirizine (ZYRTEC) 10 MG tablet Take 10 mg by mouth as needed.     . clonazePAM (KLONOPIN) 0.5 MG tablet Take 1 tablet (0.5 mg total) by mouth 3 (three) times daily as needed for anxiety. 30 tablet 0  . gabapentin (NEURONTIN) 300 MG capsule TAKE 3 CAPSULES BY MOUTH 3  TIMES DAILY 810 capsule 3  . ibuprofen (ADVIL,MOTRIN) 200 MG tablet Take 200 mg by mouth every 6 (six) hours as needed.    . methotrexate (RHEUMATREX) 2.5 MG tablet Take 2.5 mg by mouth daily. Takes 6 tablets po once a week     No current facility-administered medications on file prior to visit.     No Known Allergies  Past Medical History:  Diagnosis Date  . Anxiety   . Depression     Past Surgical History:  Procedure Laterality Date  . FRACTURE SURGERY    . I&D EXTREMITY  11/10/2011   Procedure: IRRIGATION AND DEBRIDEMENT EXTREMITY;  Surgeon:  Loanne DrillingFrank V Aluisio;  Location: [facility] OR;  Service: Orthopedics;  Laterality: Right;  . ORIF ANKLE FRACTURE  11/10/2011   Procedure: OPEN REDUCTION INTERNAL FIXATION (ORIF) ANKLE FRACTURE;  Surgeon: Loanne DrillingFrank V Aluisio;  Location: [facility] OR;  Service: Orthopedics;  Laterality: Right;  with screw applicaton    Family History  Problem Relation Age of Onset  . Cancer Daughter        osteosarcoma of leg  . Cancer Paternal Grandfather        colon    Social History   Social History  . Marital status: Married    Spouse name: N/A  . Number of children: 3  . Years of education: N/A   Occupational History  . lab tech [employer]   Social History Main Topics  . Smoking status: Never Smoker  . Smokeless tobacco: Never Used  . Alcohol use 1.2 oz/week    2 Glasses of wine per week     Comment: Very rare  . Drug use: No  . Sexual activity: No   Other Topics Concern  . Not on file   Social History Narrative   Daughter died in 2014 of osteosarcoma   The PMH, PSH, Social History, Family History, Medications, and allergies  have been reviewed in [facility], and have been updated if relevant.  Review of Systems  Constitutional: Negative for fatigue.  HENT: Negative.   Respiratory: Negative.   Cardiovascular: Negative.   Gastrointestinal: Negative.   Endocrine: Negative.   Genitourinary: Negative.   Musculoskeletal: Negative.   Skin: Negative.   Allergic/Immunologic: Negative.   Neurological: Negative.   Hematological: Negative.   Psychiatric/Behavioral: Negative.   All other systems reviewed and are negative.      Objective:    BP 120/82   Pulse 79   Temp 98 F (36.7 C) (Oral)   Resp 16   Ht  (1.727 m)   Wt 255 lb 1.9 oz (115.7 kg)   SpO2 98%   BMI 38.79 kg/m    Physical Exam   General:  Well-developed,well-nourished,in no acute distress; alert,appropriate and cooperative throughout examination Head:  normocephalic and atraumatic.   Eyes:  vision grossly intact, pupils equal,  pupils round, and pupils reactive to light.   Ears:  R ear normal and L ear normal.   Nose:  no external deformity.   Mouth:  good dentition.   Neck:  No deformities, masses, or tenderness noted. Breasts:  No mass, nodules, thickening, tenderness, bulging, retraction, inflamation, nipple discharge or skin changes noted.   Lungs:  Normal respiratory effort, chest expands symmetrically. Lungs are clear to auscultation, no crackles or wheezes. Heart:  Normal rate and regular rhythm. S1 and S2 normal without gallop, murmur, click, rub or other extra sounds. Abdomen:  Bowel sounds positive,abdomen soft and non-tender without masses, organomegaly or hernias noted. Rectal:  no external abnormalities.   Genitalia:  Pelvic Exam:        External: normal female genitalia without lesions or masses        Vagina: normal without lesions or masses        Cervix: normal without lesions or masses        Adnexa: normal bimanual exam without masses or fullness        Uterus: normal by palpation        Pap smear: performed Msk:  No deformity or scoliosis noted of thoracic or lumbar spine.   Extremities:  No clubbing, cyanosis, edema, or deformity noted with normal full range of motion of all joints.   Neurologic:  alert & oriented X3 and gait normal.   Skin:  Intact without suspicious lesions or rashes Cervical Nodes:  No lymphadenopathy noted Axillary Nodes:  No palpable lymphadenopathy Psych:  Cognition and judgment appear intact. Alert and cooperative with normal attention span and concentration. No apparent delusions, illusions, hallucinations       Assessment & Plan:   Screening for breast cancer - Plan: MM Digital Screening  Well woman exam - Plan: CBC with Differential/Platelet, Comprehensive metabolic panel, Lipid panel, TSH  Psoriatic arthritis (HCC) No Follow-up on file.

## 2017-08-11 ENCOUNTER — Encounter: Payer: Self-pay | Admitting: Internal Medicine

## 2017-08-11 LAB — COMPREHENSIVE METABOLIC PANEL
ALT: 24 IU/L (ref 0–32)
AST: 21 IU/L (ref 0–40)
Albumin/Globulin Ratio: 1.5 (ref 1.2–2.2)
Albumin: 4 g/dL (ref 3.5–5.5)
Alkaline Phosphatase: 75 IU/L (ref 39–117)
BILIRUBIN TOTAL: 0.4 mg/dL (ref 0.0–1.2)
BUN/Creatinine Ratio: 24 — ABNORMAL HIGH (ref 9–23)
BUN: 19 mg/dL (ref 6–24)
CHLORIDE: 105 mmol/L (ref 96–106)
CO2: 22 mmol/L (ref 20–29)
Calcium: 9.3 mg/dL (ref 8.7–10.2)
Creatinine, Ser: 0.8 mg/dL (ref 0.57–1.00)
GFR calc non Af Amer: 89 mL/min/{1.73_m2} (ref >59)
GFR, EST AFRICAN AMERICAN: 102 mL/min/{1.73_m2} (ref >59)
GLUCOSE: 96 mg/dL (ref 65–99)
Globulin, Total: 2.7 g/dL (ref 1.5–4.5)
Potassium: 4.9 mmol/L (ref 3.5–5.2)
Sodium: 137 mmol/L (ref 134–144)
TOTAL PROTEIN: 6.7 g/dL (ref 6.0–8.5)

## 2017-08-11 LAB — CBC WITH DIFFERENTIAL/PLATELET
Basophils Absolute: 0.1 10*3/uL (ref 0.0–0.2)
Basos: 2 %
EOS (ABSOLUTE): 0.1 10*3/uL (ref 0.0–0.4)
EOS: 2 %
HEMATOCRIT: 39.1 % (ref 34.0–46.6)
Hemoglobin: 13 g/dL (ref 11.1–15.9)
IMMATURE GRANULOCYTES: 0 %
Immature Grans (Abs): 0 10*3/uL (ref 0.0–0.1)
LYMPHS ABS: 1.3 10*3/uL (ref 0.7–3.1)
Lymphs: 39 %
MCH: 30.1 pg (ref 26.6–33.0)
MCHC: 33.2 g/dL (ref 31.5–35.7)
MCV: 91 fL (ref 79–97)
Monocytes Absolute: 0.3 10*3/uL (ref 0.1–0.9)
Monocytes: 8 %
NEUTROS PCT: 49 %
Neutrophils Absolute: 1.6 10*3/uL (ref 1.4–7.0)
Platelets: 347 10*3/uL (ref 150–379)
RBC: 4.32 x10E6/uL (ref 3.77–5.28)
RDW: 13 % (ref 12.3–15.4)
WBC: 3.4 10*3/uL (ref 3.4–10.8)

## 2017-08-11 LAB — HIV ANTIBODY (ROUTINE TESTING W REFLEX): HIV SCREEN 4TH GENERATION: NONREACTIVE

## 2017-08-11 LAB — LIPID PANEL
CHOLESTEROL TOTAL: 178 mg/dL (ref 100–199)
Chol/HDL Ratio: 4.6 ratio — ABNORMAL HIGH (ref 0.0–4.4)
HDL: 39 mg/dL — ABNORMAL LOW (ref >39)
LDL Calculated: 108 mg/dL — ABNORMAL HIGH (ref 0–99)
Triglycerides: 155 mg/dL — ABNORMAL HIGH (ref 0–149)
VLDL CHOLESTEROL CAL: 31 mg/dL (ref 5–40)

## 2017-08-11 LAB — RPR: RPR: NONREACTIVE

## 2017-08-11 LAB — TSH: TSH: 2.36 u[IU]/mL (ref 0.450–4.500)

## 2017-08-14 LAB — PAPIG, CTNGTVHSV,HPV,RFX16/18
Chlamydia, Nuc. Acid Amp: NEGATIVE
Gonococcus, Nuc. Acid Amp: NEGATIVE
HPV, HIGH-RISK: NEGATIVE
HSV 1 NAA: NEGATIVE
HSV 2 NAA: NEGATIVE
PAP SMEAR COMMENT: 0
Trich vag by NAA: NEGATIVE

## 2017-08-22 DIAGNOSIS — M545 Low back pain: Secondary | ICD-10-CM | POA: Diagnosis not present

## 2017-08-22 DIAGNOSIS — M25551 Pain in right hip: Secondary | ICD-10-CM | POA: Diagnosis not present

## 2017-08-22 DIAGNOSIS — M47817 Spondylosis without myelopathy or radiculopathy, lumbosacral region: Secondary | ICD-10-CM | POA: Diagnosis not present

## 2017-08-22 DIAGNOSIS — M25552 Pain in left hip: Secondary | ICD-10-CM | POA: Diagnosis not present

## 2017-08-25 DIAGNOSIS — M25559 Pain in unspecified hip: Secondary | ICD-10-CM | POA: Diagnosis not present

## 2017-08-27 ENCOUNTER — Encounter: Payer: Self-pay | Admitting: Internal Medicine

## 2017-08-29 ENCOUNTER — Encounter: Payer: Self-pay | Admitting: Internal Medicine

## 2017-08-29 ENCOUNTER — Ambulatory Visit
Admission: RE | Admit: 2017-08-29 | Discharge: 2017-08-29 | Disposition: A | Discharge status: Home / Self Care | Payer: 59 | Source: Ambulatory Visit | Attending: Family Medicine | Admitting: Family Medicine

## 2017-08-29 ENCOUNTER — Ambulatory Visit (INDEPENDENT_AMBULATORY_CARE_PROVIDER_SITE_OTHER): Payer: 59 | Admitting: Internal Medicine

## 2017-08-29 ENCOUNTER — Telehealth: Payer: Self-pay

## 2017-08-29 VITALS — BP 114/80 | HR 84 | Resp 16 | Ht 68.0 in | Wt 260.0 lb

## 2017-08-29 DIAGNOSIS — Z1239 Encounter for other screening for malignant neoplasm of breast: Secondary | ICD-10-CM

## 2017-08-29 DIAGNOSIS — G4733 Obstructive sleep apnea (adult) (pediatric): Secondary | ICD-10-CM | POA: Diagnosis not present

## 2017-08-29 DIAGNOSIS — M25551 Pain in right hip: Secondary | ICD-10-CM | POA: Diagnosis not present

## 2017-08-29 DIAGNOSIS — Z1231 Encounter for screening mammogram for malignant neoplasm of breast: Secondary | ICD-10-CM | POA: Diagnosis present

## 2017-08-29 DIAGNOSIS — M25552 Pain in left hip: Secondary | ICD-10-CM | POA: Diagnosis not present

## 2017-08-29 MED ORDER — ALBUTEROL SULFATE HFA 108 (90 BASE) MCG/ACT IN AERS: 2.0000 | INHALATION_SPRAY | Freq: Four times a day (QID) | RESPIRATORY_TRACT | 2 refills | Status: DC | PRN

## 2017-08-29 MED ORDER — BECLOMETHASONE DIPROP HFA 80 MCG/ACT IN AERB: 2.0000 | INHALATION_SPRAY | Freq: Two times a day (BID) | RESPIRATORY_TRACT | 0 refills | Status: DC

## 2017-08-29 NOTE — Patient Instructions (Addendum)
--  Start qvar 80 2 puff twice daily, rinse mouth after use. Use the sample and if it helps call for a prescription.  --Adjust Auto CPAP setting to 7-15 cm H2O.

## 2017-08-29 NOTE — Telephone Encounter (Signed)
Pt left v/m requesting appt to discuss pt having problem with memory. Unable to reach pt by phone and per DPR left vm on cell for pt to call [facility] for appt.

## 2017-08-29 NOTE — Progress Notes (Signed)
[facility] Consultation      Assessment and Plan:  Excessive daytime sleepiness. -HST 06/25/17; OSA with AHI of 7. Started on Auto-CPAP 5-20.  --Adjust Auto CPAP setting to 7-15 cm H2O.  Anxiety/depression. -Obstructive sleep apnea can contribute to anxiety and depression, therefore, is important treat sleep apnea if present.  Dyspnea, possible allergic asthma, primarily occurring in the fall season. -Symptoms occur twice a week or more, will start on Qvar inhaler. Qvar 80 2 puffs twice daily during the autumn season. She is also given a prescription for Provera rescue inhaler to be used as needed.     Date: 08/29/2017  MRN# [id number] Crystal Houston 03/03/1971    Crystal Houston is a 45 y.o. old female seen in consultation for chief complaint of:    Chief Complaint  Patient presents with  . Sleep Apnea    pt is having mask issues.    HPI:   The patient is a 45-year-old female who comes in today for follow up on recent sleep study which showed positive sleep apnea. She is feeling more awake during the day, she is no longer snoring.  She notices that she has trouble tolerating her full mask and wants to switch to a nasal mask.  She notices that she had a cold with a residual cough and dyspnea and had trouble getting in air. She was put on an inhaler, nasal spray, cough medicine.   She has gotten that sensation again since that time about twice per week, where she can not get in enough air. She takes no regular inhalers currently . She has a 3 dogs at home, occasionally in bed. She does have seasonal allergies with runny nose and takes benadryl in the fall.   **Download data reviewed; 30 days as of 08/27/17; average usage on days used was 4 hours 25 minutes, uses greater than 4 hours is 63%. AutoSet 5-20. 95th percentile pressure was 12, median is 8, maximum of 13.6. Residual AHI 0.9.  Medication:    Current Outpatient Prescriptions:  .  celecoxib  (CELEBREX) 200 MG capsule, Take 200 mg by mouth 2 (two) times daily., Disp: , Rfl:  .  Certolizumab Pegol (CIMZIA) 2 X 200 MG KIT, Inject 200 Units into the skin every 21 ( twenty-one) days., Disp: , Rfl:  .  cetirizine (ZYRTEC) 10 MG tablet, Take 10 mg by mouth as needed. , Disp: , Rfl:  .  clonazePAM (KLONOPIN) 0.5 MG tablet, Take 1 tablet (0.5 mg total) by mouth 3 (three) times daily as needed for anxiety., Disp: 30 tablet, Rfl: 0 .  gabapentin (NEURONTIN) 300 MG capsule, TAKE 3 CAPSULES BY MOUTH 3  TIMES DAILY, Disp: 810 capsule, Rfl: 3 .  ibuprofen (ADVIL,MOTRIN) 200 MG tablet, Take 200 mg by mouth every 6 (six) hours as needed., Disp: , Rfl:  .  methotrexate (RHEUMATREX) 2.5 MG tablet, Take 2.5 mg by mouth daily. Takes 6 tablets po once a week, Disp: , Rfl:    Allergies:  Patient has no known allergies.  Review of Systems: Gen:  Denies  fever, sweats, chills HEENT: Denies blurred vision, double vision. bleeds, sore throat Cvc:  No dizziness, chest pain. Resp:   Denies cough or sputum production, shortness of breath Gi: Denies swallowing difficulty, stomach pain. Gu:  Denies bladder incontinence, burning urine Ext:   No Joint pain, stiffness. Skin: No skin rash,  hives  Endoc:  No polyuria, polydipsia. Psych: No depression, insomnia. Other:  All other systems   were reviewed with the patient and were negative other that what is mentioned in the HPI.   Physical Examination:   VS: BP 114/80 (BP Location: Left Arm, Cuff Size: Normal)   Pulse 84   Resp 16   Ht 5' 8" (1.727 m)   Wt 260 lb (117.9 kg)   SpO2 96%   BMI 39.53 kg/m   General Appearance: No distress  Neuro:without focal findings,  speech normal,  HEENT: PERRLA, EOM intact.   Pulmonary: normal breath sounds, No wheezing.  CardiovascularNormal S1,S2.  No m/r/g.   Abdomen: Benign, Soft, non-tender. Renal:  No costovertebral tenderness  GU:  No performed at this time. Endoc: No evident thyromegaly, no signs of  acromegaly. Skin:   warm, no rashes, no ecchymosis  Extremities: normal, no cyanosis, clubbing.  Other findings:    LABORATORY PANEL:   CBC No results for input(s): WBC, HGB, HCT, PLT in the last 168 hours. ------------------------------------------------------------------------------------------------------------------  Chemistries  No results for input(s): NA, K, CL, CO2, GLUCOSE, BUN, CREATININE, CALCIUM, MG, AST, ALT, ALKPHOS, BILITOT in the last 168 hours.  Invalid input(s): GFRCGP ------------------------------------------------------------------------------------------------------------------  Cardiac Enzymes No results for input(s): TROPONINI in the last 168 hours. ------------------------------------------------------------  RADIOLOGY:  No results found.     Thank  you for the consultation and for allowing [facility] to assist in the care of your patient. Our recommendations are noted above.  Please contact us if we can be of further service.   Deep Ramachandran, MD.  Board Certified in Internal Medicine, Pulmonary Medicine, Critical Care Medicine, and Sleep Medicine.  [place] [facility] Office Number: [phone]  David Kasa, M.D.  David Simonds, M.D  08/29/2017 

## 2017-08-31 ENCOUNTER — Telehealth: Payer: Self-pay | Admitting: *Deleted

## 2017-08-31 DIAGNOSIS — M25552 Pain in left hip: Secondary | ICD-10-CM | POA: Diagnosis not present

## 2017-08-31 DIAGNOSIS — M25551 Pain in right hip: Secondary | ICD-10-CM | POA: Diagnosis not present

## 2017-08-31 MED ORDER — ALBUTEROL SULFATE HFA 108 (90 BASE) MCG/ACT IN AERS: 2.0000 | INHALATION_SPRAY | Freq: Four times a day (QID) | RESPIRATORY_TRACT | 2 refills | Status: DC | PRN

## 2017-08-31 NOTE — Telephone Encounter (Signed)
Pt called back to let us know we can send it to   [facility] [address]

## 2017-08-31 NOTE — Telephone Encounter (Signed)
LM need to know which pharmacy patient would like rescue inhaler sent to. It was sent to [facility]. Denial to fill came back stating unable to verify patient.

## 2017-08-31 NOTE — Telephone Encounter (Signed)
Refill sent [facility] [address]

## 2017-08-31 NOTE — Addendum Note (Signed)
Addended by: Alease Frame on: 08/31/2017 04:21 PM   Modules accepted: Orders

## 2017-09-04 DIAGNOSIS — M25551 Pain in right hip: Secondary | ICD-10-CM | POA: Diagnosis not present

## 2017-09-04 DIAGNOSIS — M25552 Pain in left hip: Secondary | ICD-10-CM | POA: Diagnosis not present

## 2017-09-05 ENCOUNTER — Ambulatory Visit: Payer: 59 | Admitting: Family Medicine

## 2017-09-05 ENCOUNTER — Encounter: Payer: Self-pay | Admitting: Family Medicine

## 2017-09-05 MED ORDER — PREGABALIN 75 MG PO CAPS: 75.0000 mg | ORAL_CAPSULE | Freq: Two times a day (BID) | ORAL | 3 refills | Status: DC

## 2017-09-06 DIAGNOSIS — M25552 Pain in left hip: Secondary | ICD-10-CM | POA: Diagnosis not present

## 2017-09-06 DIAGNOSIS — M25551 Pain in right hip: Secondary | ICD-10-CM | POA: Diagnosis not present

## 2017-09-09 DIAGNOSIS — S82309A Unspecified fracture of lower end of unspecified tibia, initial encounter for closed fracture: Secondary | ICD-10-CM | POA: Diagnosis not present

## 2017-09-09 DIAGNOSIS — G4733 Obstructive sleep apnea (adult) (pediatric): Secondary | ICD-10-CM | POA: Diagnosis not present

## 2017-09-12 ENCOUNTER — Encounter: Payer: Self-pay | Admitting: Family Medicine

## 2017-09-12 ENCOUNTER — Ambulatory Visit (INDEPENDENT_AMBULATORY_CARE_PROVIDER_SITE_OTHER): Payer: 59 | Admitting: Family Medicine

## 2017-09-12 ENCOUNTER — Ambulatory Visit
Admission: RE | Admit: 2017-09-12 | Discharge: 2017-09-12 | Disposition: A | Discharge status: Home / Self Care | Payer: 59 | Source: Ambulatory Visit | Attending: Family Medicine | Admitting: Family Medicine

## 2017-09-12 VITALS — BP 144/80 | HR 87 | Temp 98.1°F | Ht 68.0 in | Wt 264.4 lb

## 2017-09-12 DIAGNOSIS — M79604 Pain in right leg: Secondary | ICD-10-CM | POA: Diagnosis not present

## 2017-09-12 DIAGNOSIS — F418 Other specified anxiety disorders: Secondary | ICD-10-CM | POA: Diagnosis not present

## 2017-09-12 DIAGNOSIS — M7989 Other specified soft tissue disorders: Secondary | ICD-10-CM | POA: Diagnosis not present

## 2017-09-12 MED ORDER — VENLAFAXINE HCL ER 37.5 MG PO CP24: 37.5000 mg | ORAL_CAPSULE | Freq: Every day | ORAL | 3 refills | Status: DC

## 2017-09-12 NOTE — Patient Instructions (Addendum)
We are started effexor- daily with breakfast.  Please keep me updated.    Please stop by to see Crystal Houston on your way out to schedule your ultrasound of your leg.

## 2017-09-12 NOTE — Assessment & Plan Note (Signed)
New- concern for DVT. Right LE doppler ordered. The patient indicates understanding of these issues and agrees with the plan.

## 2017-09-12 NOTE — Progress Notes (Signed)
Subjective:   Patient ID: Crystal Houston, female    DOB: October 15, 1971, 46 y.o.   MRN: [id number]  Crystal Houston is a pleasant 46 y.o. year old female who presents to clinic today with Depression (Patient is here today to discuss Tx for depression.)  on 09/12/2017  HPI:  Depression-  Worsened.  PHQ 9 20 today. Has tried multiple SSRIs, most recently zoloft without much improvement of symptoms. Has tried buspar, lexapro,  She constantly worried about things, cannot sleep, tearful.   No SI or HI.   Right leg pain-  Acute onset 3 days ago of right lower leg pain, warmth. Pain worse with walking. No CP or SOB. No recent travel. No known injury.    Current Outpatient Prescriptions on File Prior to Visit  Medication Sig Dispense Refill  . albuterol (PROVENTIL HFA;VENTOLIN HFA) 108 (90 Base) MCG/ACT inhaler Inhale 2 puffs into the lungs every 6 (six) hours as needed for wheezing or shortness of breath. 3 Inhaler 2  . beclomethasone (QVAR REDIHALER) 80 MCG/ACT inhaler Inhale 2 puffs into the lungs 2 (two) times daily. 10.6 g 0  . celecoxib (CELEBREX) 200 MG capsule Take 200 mg by mouth 2 (two) times daily.    . Certolizumab Pegol (CIMZIA) 2 X 200 MG KIT Inject 200 Units into the skin every 21 ( twenty-one) days.    . cetirizine (ZYRTEC) 10 MG tablet Take 10 mg by mouth as needed.     . clonazePAM (KLONOPIN) 0.5 MG tablet Take 1 tablet (0.5 mg total) by mouth 3 (three) times daily as needed for anxiety. 30 tablet 0  . ibuprofen (ADVIL,MOTRIN) 200 MG tablet Take 200 mg by mouth every 6 (six) hours as needed.    . pregabalin (LYRICA) 75 MG capsule Take 1 capsule (75 mg total) by mouth 2 (two) times daily. 60 capsule 3   No current facility-administered medications on file prior to visit.     No Known Allergies  Past Medical History:  Diagnosis Date  . Anxiety   . Depression   . OSA on CPAP 08/10/2017    Past Surgical History:  Procedure Laterality Date  . FRACTURE SURGERY      . I&D EXTREMITY  11/10/2011   Procedure: IRRIGATION AND DEBRIDEMENT EXTREMITY;  Surgeon: Gearlean Alf;  Location: [facility];  Service: Orthopedics;  Laterality: Right;  . ORIF ANKLE FRACTURE  11/10/2011   Procedure: OPEN REDUCTION INTERNAL FIXATION (ORIF) ANKLE FRACTURE;  Surgeon: Gearlean Alf;  Location: [facility];  Service: Orthopedics;  Laterality: Right;  with screw applicaton    Family History  Problem Relation Age of Onset  . Cancer Daughter        osteosarcoma of leg  . Cancer Paternal Grandfather        colon    Social History   Social History  . Marital status: Married    Spouse name: N/A  . Number of children: 3  . Years of education: N/A   Occupational History  . lab [employer]  . Smoking status: Never Smoker  . Smokeless tobacco: Never Used  . Alcohol use 1.2 oz/week    2 Glasses of wine per week     Comment: Very rare  . Drug use: No  . Sexual activity: No   Other Topics Concern  . Not on file   Social History Narrative   Daughter died in 21-Feb-2013 of osteosarcoma   The PMH, [place], Social  History, Family History, Medications, and allergies have been reviewed in [facility], and have been updated if relevant.   Review of Systems  Psychiatric/Behavioral: Positive for decreased concentration, dysphoric mood and sleep disturbance. Negative for agitation, behavioral problems, confusion, hallucinations, self-injury and suicidal ideas. The patient is nervous/anxious. The patient is not hyperactive.        Objective:    BP (!) 144/80 (BP Location: Right Arm, Patient Position: Sitting, Cuff Size: Large)   Pulse 87   Temp 98.1 F (36.7 C) (Oral)   Ht 5' 8"  (1.727 m)   Wt 264 lb 6.4 oz (119.9 kg)   LMP 09/09/2017   SpO2 98%   BMI 40.20 kg/m    Physical Exam  Constitutional: She is oriented to person, place, and time. She appears well-developed and well-nourished. No distress.  HENT:  Head: Normocephalic and atraumatic.  Eyes:  Conjunctivae are normal.  Cardiovascular: Normal rate.   Pulmonary/Chest: Effort normal.  Musculoskeletal:       Right lower leg: She exhibits tenderness and swelling. She exhibits no bony tenderness, no edema, no deformity and no laceration.  Neurological: She is alert and oriented to person, place, and time. No cranial nerve deficit.  Skin: She is not diaphoretic.  Psychiatric:  tearful  Nursing note and vitals reviewed.         Assessment & Plan:   Depression with anxiety No Follow-up on file.

## 2017-09-12 NOTE — Addendum Note (Signed)
Addended by: Dianne Dun on: 09/12/2017 12:18 PM   Modules accepted: Orders

## 2017-09-12 NOTE — Assessment & Plan Note (Signed)
Deteriorated with PHQ 9 of 20 today. She does not yet want to see a psychiatrist.  Already has a therapist. Wants to try another rx first. eRx sent for effexor.  We discussed possible side effects of headache, GI upset, drowsiness, and SI/HI. If thoughts of SI/HI develop, we discussed to present to the emergency immediately. Patient verbalized understanding.

## 2017-09-13 ENCOUNTER — Ambulatory Visit: Admission: RE | Admit: 2017-09-13 | Payer: 59 | Source: Ambulatory Visit

## 2017-09-13 DIAGNOSIS — M25551 Pain in right hip: Secondary | ICD-10-CM | POA: Diagnosis not present

## 2017-09-13 DIAGNOSIS — L4050 Arthropathic psoriasis, unspecified: Secondary | ICD-10-CM | POA: Diagnosis not present

## 2017-09-13 DIAGNOSIS — M25552 Pain in left hip: Secondary | ICD-10-CM | POA: Diagnosis not present

## 2017-09-13 DIAGNOSIS — M4690 Unspecified inflammatory spondylopathy, site unspecified: Secondary | ICD-10-CM | POA: Diagnosis not present

## 2017-10-04 ENCOUNTER — Encounter: Payer: Self-pay | Admitting: Family Medicine

## 2017-10-04 ENCOUNTER — Other Ambulatory Visit: Payer: Self-pay | Admitting: Family Medicine

## 2017-10-04 MED ORDER — PREGABALIN 150 MG PO CAPS: 150.0000 mg | ORAL_CAPSULE | Freq: Two times a day (BID) | ORAL | 0 refills | Status: DC

## 2017-10-05 ENCOUNTER — Other Ambulatory Visit: Payer: Self-pay

## 2017-10-05 MED ORDER — PREGABALIN 150 MG PO CAPS: 150.0000 mg | ORAL_CAPSULE | Freq: Two times a day (BID) | ORAL | 0 refills | Status: DC

## 2017-10-05 NOTE — Progress Notes (Signed)
Printed/will fax to new [facility] per pt req when TA signs/thx dmf

## 2017-10-09 ENCOUNTER — Telehealth: Payer: Self-pay | Admitting: Family Medicine

## 2017-10-09 MED ORDER — PREGABALIN 150 MG PO CAPS: 150.0000 mg | ORAL_CAPSULE | Freq: Two times a day (BID) | ORAL | 0 refills | Status: DC

## 2017-10-09 NOTE — Telephone Encounter (Signed)
Copied from CRM [phone]#[id number]. Topic: Quick Communication - See Telephone Encounter >> Oct 09, 2017 10:04 AM Eston Mouldavis, Cristie Mckinney B wrote: CRM for notification. See Telephone encounter for:  10/09/17.  Pt asking about refill on lyrica, the dosage was to be changed, she sees changes made in mychart but it hasn't been sent to pharmacy  pt is out of meds and not sure if she should wait or go ahead and get the old dosage .  Pt asked to call her she may need [facility] different [facility].

## 2017-10-09 NOTE — Telephone Encounter (Signed)
This patient's pharmacy reports to the patient they don't have the new increased dosage of lyrica, however, the new 150mg  for twice/day was processed on November 1st.  She is requesting for the increased [facility] [address] [place], as she will be in [place] for the next few hours.  Please respond.

## 2017-10-09 NOTE — Telephone Encounter (Signed)
I printed Rx for Lyrica for 30d and placed on TA's desk with note asking if ok to fax to [facility] [place] to last till MO arrives/will call pt when signed/thx dmf

## 2017-10-09 NOTE — Telephone Encounter (Signed)
[facility]/pt aware/thx dmf

## 2017-10-10 ENCOUNTER — Telehealth: Payer: Self-pay

## 2017-10-10 DIAGNOSIS — G4733 Obstructive sleep apnea (adult) (pediatric): Secondary | ICD-10-CM | POA: Diagnosis not present

## 2017-10-10 DIAGNOSIS — S82309A Unspecified fracture of lower end of unspecified tibia, initial encounter for closed fracture: Secondary | ICD-10-CM | POA: Diagnosis not present

## 2017-10-10 NOTE — Telephone Encounter (Signed)
Yes ok to change as requested.

## 2017-10-10 NOTE — Telephone Encounter (Signed)
Copied from CRM #[id number]. Topic: General - Other >> Oct 10, 2017  2:54 PM Clack, Jessica D wrote: Reason for CRM: Patient states that with the dosage change of her lyrica 150mg her insurance is making her go to mail order and the price went up. She would like to know instead of the 1 tablet of 150mg can she take 2 75mg?  

## 2017-10-10 NOTE — Telephone Encounter (Signed)
Copied from CRM [phone]#[id number]. Topic: General - Other >> Oct 10, 2017  2:54 PM Clack, Princella PellegriniJessica D wrote: Reason for CRM: Patient states that with the dosage change of her lyrica 150mg  her insurance is making her go to mail order and the price went up. She would like to know instead of the 1 tablet of 150mg  can she take 2 75mg ?

## 2017-10-11 ENCOUNTER — Telehealth: Payer: Self-pay | Admitting: Family Medicine

## 2017-10-11 MED ORDER — PREGABALIN 75 MG PO CAPS: ORAL_CAPSULE | ORAL | 0 refills | Status: DC

## 2017-10-11 NOTE — Telephone Encounter (Signed)
Manjot Hinks/C'ed Lyrica 150mg /Start Lyrica 75mg  2bid #120 printed/pt aware/[facility]/thx dmf

## 2017-10-11 NOTE — Telephone Encounter (Signed)
Copied from CRM #[id number]. Topic: Quick Communication - See Telephone Encounter >> Oct 11, 2017  2:10 PM Crystal Houston, Crystal Houston, NT wrote: CRM for notification. See Telephone encounter for: Pt. Called back about not being able to ger her prescription for her pregabalin (LYRICA) 75 MG capsule. [facility] [address]    29/56/2110/06/21.

## 2017-10-11 NOTE — Telephone Encounter (Signed)
Rx refaxed/thx dmf

## 2017-10-11 NOTE — Telephone Encounter (Signed)
See phone note 10/10/17; [facility]; [facility] and [facility] was not received. Request to refax lyrica 75 mg rx.

## 2017-10-18 DIAGNOSIS — G4733 Obstructive sleep apnea (adult) (pediatric): Secondary | ICD-10-CM | POA: Diagnosis not present

## 2017-10-18 DIAGNOSIS — S82309A Unspecified fracture of lower end of unspecified tibia, initial encounter for closed fracture: Secondary | ICD-10-CM | POA: Diagnosis not present

## 2017-11-08 ENCOUNTER — Other Ambulatory Visit (INDEPENDENT_AMBULATORY_CARE_PROVIDER_SITE_OTHER): Payer: Self-pay

## 2017-11-08 ENCOUNTER — Other Ambulatory Visit: Payer: Self-pay | Admitting: *Deleted

## 2017-11-08 DIAGNOSIS — R0600 Dyspnea, unspecified: Secondary | ICD-10-CM | POA: Diagnosis not present

## 2017-11-08 DIAGNOSIS — E559 Vitamin D deficiency, unspecified: Secondary | ICD-10-CM | POA: Diagnosis not present

## 2017-11-08 DIAGNOSIS — R5383 Other fatigue: Secondary | ICD-10-CM | POA: Diagnosis not present

## 2017-11-08 MED ORDER — BECLOMETHASONE DIPROP HFA 80 MCG/ACT IN AERB: 2.0000 | INHALATION_SPRAY | Freq: Two times a day (BID) | RESPIRATORY_TRACT | 1 refills | Status: DC

## 2017-11-09 ENCOUNTER — Other Ambulatory Visit (HOSPITAL_COMMUNITY): Payer: Self-pay | Admitting: Internal Medicine

## 2017-11-09 DIAGNOSIS — R0600 Dyspnea, unspecified: Secondary | ICD-10-CM

## 2017-11-09 DIAGNOSIS — S82309A Unspecified fracture of lower end of unspecified tibia, initial encounter for closed fracture: Secondary | ICD-10-CM | POA: Diagnosis not present

## 2017-11-09 DIAGNOSIS — G4733 Obstructive sleep apnea (adult) (pediatric): Secondary | ICD-10-CM | POA: Diagnosis not present

## 2017-11-10 ENCOUNTER — Ambulatory Visit (HOSPITAL_COMMUNITY)
Admission: RE | Admit: 2017-11-10 | Discharge: 2017-11-10 | Disposition: A | Discharge status: Home / Self Care | Payer: 59 | Source: Ambulatory Visit | Attending: Internal Medicine | Admitting: Internal Medicine

## 2017-11-10 DIAGNOSIS — I340 Nonrheumatic mitral (valve) insufficiency: Secondary | ICD-10-CM | POA: Insufficient documentation

## 2017-11-10 DIAGNOSIS — R0600 Dyspnea, unspecified: Secondary | ICD-10-CM | POA: Diagnosis not present

## 2017-11-10 DIAGNOSIS — E669 Obesity, unspecified: Secondary | ICD-10-CM | POA: Insufficient documentation

## 2017-11-10 DIAGNOSIS — G4733 Obstructive sleep apnea (adult) (pediatric): Secondary | ICD-10-CM | POA: Insufficient documentation

## 2017-11-10 LAB — ECHOCARDIOGRAM COMPLETE
AVLVOTPG: 3 mmHg
CHL CUP MV DEC (S): 173
CHL CUP STROKE VOLUME: 51 mL
EERAT: 8.18
EWDT: 173 ms
FS: 45 % — AB (ref 28–44)
IV/PV OW: 1.14
LA diam end sys: 35 mm
LA diam index: 1.43 cm/m2
LA vol: 54.4 mL
LASIZE: 35 mm
LAVOLA4C: 55.2 mL
LAVOLIN: 22.2 mL/m2
LDCA: 3.46 cm2
LV E/e' medial: 8.18
LV E/e'average: 8.18
LV SIMPSON'S DISK: 62
LV e' LATERAL: 12.1 cm/s
LV sys vol index: 12 mL/m2
LV sys vol: 31 mL
LVDIAVOL: 81 mL (ref 46–106)
LVDIAVOLIN: 33 mL/m2
LVOT SV: 73 mL
LVOT VTI: 21 cm
LVOT diameter: 21 mm
LVOT peak vel: 88.3 cm/s
MV Peak grad: 4 mmHg
MV pk A vel: 66.8 m/s
MV pk E vel: 99 m/s
PW: 9.01 mm — AB (ref 0.6–1.1)
RV LATERAL S' VELOCITY: 12.9 cm/s
TAPSE: 19.4 mm
TDI e' lateral: 12.1
TDI e' medial: 9.79

## 2017-11-10 NOTE — Progress Notes (Signed)
*  PRELIMINARY RESULTS* Echocardiogram 2D Echocardiogram has been performed.  Stacey DrainWhite, Karely Hurtado J 11/10/2017, 9:15 AM

## 2017-11-16 DIAGNOSIS — Z0389 Encounter for observation for other suspected diseases and conditions ruled out: Secondary | ICD-10-CM | POA: Diagnosis not present

## 2017-11-27 DIAGNOSIS — R5383 Other fatigue: Secondary | ICD-10-CM | POA: Diagnosis not present

## 2017-11-27 DIAGNOSIS — E559 Vitamin D deficiency, unspecified: Secondary | ICD-10-CM | POA: Diagnosis not present

## 2017-12-10 DIAGNOSIS — S82309A Unspecified fracture of lower end of unspecified tibia, initial encounter for closed fracture: Secondary | ICD-10-CM | POA: Diagnosis not present

## 2017-12-10 DIAGNOSIS — G4733 Obstructive sleep apnea (adult) (pediatric): Secondary | ICD-10-CM | POA: Diagnosis not present

## 2018-01-01 DIAGNOSIS — R5383 Other fatigue: Secondary | ICD-10-CM | POA: Diagnosis not present

## 2018-01-02 DIAGNOSIS — L4050 Arthropathic psoriasis, unspecified: Secondary | ICD-10-CM | POA: Diagnosis not present

## 2018-01-02 DIAGNOSIS — M4690 Unspecified inflammatory spondylopathy, site unspecified: Secondary | ICD-10-CM | POA: Diagnosis not present

## 2018-01-03 ENCOUNTER — Other Ambulatory Visit: Payer: Self-pay

## 2018-01-03 MED ORDER — VENLAFAXINE HCL ER 37.5 MG PO CP24: 37.5000 mg | ORAL_CAPSULE | Freq: Every day | ORAL | 0 refills | Status: DC

## 2018-01-05 DIAGNOSIS — L718 Other rosacea: Secondary | ICD-10-CM | POA: Diagnosis not present

## 2018-01-10 DIAGNOSIS — S82309A Unspecified fracture of lower end of unspecified tibia, initial encounter for closed fracture: Secondary | ICD-10-CM | POA: Diagnosis not present

## 2018-01-10 DIAGNOSIS — G4733 Obstructive sleep apnea (adult) (pediatric): Secondary | ICD-10-CM | POA: Diagnosis not present

## 2018-02-07 DIAGNOSIS — S82309A Unspecified fracture of lower end of unspecified tibia, initial encounter for closed fracture: Secondary | ICD-10-CM | POA: Diagnosis not present

## 2018-02-07 DIAGNOSIS — G4733 Obstructive sleep apnea (adult) (pediatric): Secondary | ICD-10-CM | POA: Diagnosis not present

## 2018-02-13 DIAGNOSIS — R5383 Other fatigue: Secondary | ICD-10-CM | POA: Diagnosis not present

## 2018-03-06 ENCOUNTER — Encounter: Payer: Self-pay | Admitting: Internal Medicine

## 2018-03-07 NOTE — Progress Notes (Signed)
[facility] Consultation      Assessment and Plan:  Obstructive sleep apnea. -HST 06/25/17; OSA with AHI of 7.Continue CPAP setting to 7-15 cm H2O. - Having trouble tolerating full facemask, patient is going to advance him care later today to get a new mask.  She is asked to call us back if this does not work out and we can refer her to the mask fitting clinic.  Allergic asthma. -Symptoms are worse during the allergy seasons, therefore she is asked to continue Qvar through these times. - Continue Qvar 80 2 puffs twice daily, rinse mouth after use, use albuterol as needed.  Anxiety/depression. -Obstructive sleep apnea can contribute to anxiety and depression, therefore, is important treat sleep apnea if present.  Return in about 6 months (around 09/07/2018).    Date: 03/07/2018  MRN# [id number] Crystal Houston 04-Dec-1971    Crystal Houston is a 47 y.o. old female seen in consultation for chief complaint of:    Chief Complaint  Patient presents with  . Sleep Apnea    DME:[facility] pt wear cpap most nights:pt is having issues with her mask. She will be going to [facility] to get a different mask today. She feels like she is suffocating and takes mask off in middle of night.Marland Kitchen    HPI:   The patient is a 47 year old female with a history of obstructive sleep apnea, last visit she had been doing well, her auto settings were adjusted to a pressure range of 7-15 cm of water.  She was also noted to have some dyspnea, possibly secondary to asthma.  She was started on Qvar inhaler during the autumn season, she was also given a prescription for Proventil rescue inhaler. She feels that her breathing is better, it is usually worse when the pollen starts going. She is using qvar 2 puffs bid and brushes teeth. She has not used the albuterol in several months.   She is having trouble tolerating the full face mask, often takes it off in the middle of the night. She is not bothered by  pressure, she is going to [facility] later today for a new mask.   She has a 3 dogs at home, occasionally in bed. She does have seasonal allergies with runny nose and takes benadryl in the fall.   **Download data personally reviewed, 30 days as of 03/13/18: Uses greater than 4 hours is 13/30 days.  Average usage on days used is 4 hours 25 minutes.  Pressure setting is 7-15.  Median pressure is 9, 95th percentile pressure is 11, maximum pressure of 12.  Residual AHI is 1.  Medication:    Current Outpatient Medications:  .  albuterol (PROVENTIL HFA;VENTOLIN HFA) 108 (90 Base) MCG/ACT inhaler, Inhale 2 puffs into the lungs every 6 (six) hours as needed for wheezing or shortness of breath., Disp: 3 Inhaler, Rfl: 2 .  beclomethasone (QVAR REDIHALER) 80 MCG/ACT inhaler, Inhale 2 puffs into the lungs 2 (two) times daily., Disp: 3 Inhaler, Rfl: 1 .  celecoxib (CELEBREX) 200 MG capsule, Take 200 mg by mouth 2 (two) times daily., Disp: , Rfl:  .  Certolizumab Pegol (CIMZIA) 2 X 200 MG KIT, Inject 200 Units into the skin every 21 ( twenty-one) days., Disp: , Rfl:  .  cetirizine (ZYRTEC) 10 MG tablet, Take 10 mg by mouth as needed. , Disp: , Rfl:  .  clonazePAM (KLONOPIN) 0.5 MG tablet, Take 1 tablet (0.5 mg total) by mouth 3 (three) times daily as  needed for anxiety., Disp: 30 tablet, Rfl: 0 .  ibuprofen (ADVIL,MOTRIN) 200 MG tablet, Take 200 mg by mouth every 6 (six) hours as needed., Disp: , Rfl:  .  pregabalin (LYRICA) 75 MG capsule, Take 2 capsules BID, Disp: 120 capsule, Rfl: 0 .  venlafaxine XR (EFFEXOR XR) 37.5 MG 24 hr capsule, Take 1 capsule (37.5 mg total) by mouth daily with breakfast., Disp: 90 capsule, Rfl: 0   Allergies:  Patient has no known allergies.  Review of Systems: Gen:  Denies  fever, sweats, chills HEENT: Denies blurred vision, double vision. bleeds, sore throat Cvc:  No dizziness, chest pain. Resp:   Denies cough or sputum production, shortness of breath Gi: Denies swallowing  difficulty, stomach pain. Gu:  Denies bladder incontinence, burning urine Ext:   No Joint pain, stiffness. Skin: No skin rash,  hives  Endoc:  No polyuria, polydipsia. Psych: No depression, insomnia. Other:  All other systems were reviewed with the patient and were negative other that what is mentioned in the HPI.   Physical Examination:   VS: BP 110/80 (BP Location: Left Arm, Cuff Size: Large)   Pulse 96   Resp 16   Ht 5' 8"  (1.727 m)   Wt 274 lb (124.3 kg)   SpO2 94%   BMI 41.66 kg/m   General Appearance: No distress  Neuro:without focal findings,  speech normal,  HEENT: PERRLA, EOM intact.   Pulmonary: normal breath sounds, No wheezing.  CardiovascularNormal S1,S2.  No m/r/g.   Abdomen: Benign, Soft, non-tender. Renal:  No costovertebral tenderness  GU:  No performed at this time. Endoc: No evident thyromegaly, no signs of acromegaly. Skin:   warm, no rashes, no ecchymosis  Extremities: normal, no cyanosis, clubbing.  Other findings:    LABORATORY PANEL:   CBC No results for input(s): WBC, HGB, HCT, PLT in the last 168 hours. ------------------------------------------------------------------------------------------------------------------  Chemistries  No results for input(s): NA, K, CL, CO2, GLUCOSE, BUN, CREATININE, CALCIUM, MG, AST, ALT, ALKPHOS, BILITOT in the last 168 hours.  Invalid input(s): GFRCGP ------------------------------------------------------------------------------------------------------------------  Cardiac Enzymes No results for input(s): TROPONINI in the last 168 hours. ------------------------------------------------------------  RADIOLOGY:  No results found.     Thank  you for the consultation and for allowing [facility] to assist in the care of your patient. Our recommendations are noted above.  Please contact us if we can be of further service.   Marda Stalker, MD.  Board Certified in Internal  Medicine, Pulmonary Medicine, [place], and Sleep Medicine.  [facility] Office Number: [phone]  Patricia Pesa, M.D.  Merton Border, M.D  03/07/2018

## 2018-03-08 ENCOUNTER — Ambulatory Visit: Payer: 59 | Admitting: Internal Medicine

## 2018-03-08 ENCOUNTER — Encounter: Payer: Self-pay | Admitting: Internal Medicine

## 2018-03-08 VITALS — BP 110/80 | HR 96 | Resp 16 | Ht 68.0 in | Wt 274.0 lb

## 2018-03-08 DIAGNOSIS — J45909 Unspecified asthma, uncomplicated: Secondary | ICD-10-CM | POA: Diagnosis not present

## 2018-03-08 DIAGNOSIS — G4733 Obstructive sleep apnea (adult) (pediatric): Secondary | ICD-10-CM | POA: Diagnosis not present

## 2018-03-08 NOTE — Patient Instructions (Addendum)
If you continue to have mask fitting problems give us a call and we can send you to the mask fitting clinic.  Continue qvar. Use albuterol when needed.

## 2018-03-10 DIAGNOSIS — G4733 Obstructive sleep apnea (adult) (pediatric): Secondary | ICD-10-CM | POA: Diagnosis not present

## 2018-03-10 DIAGNOSIS — S82309A Unspecified fracture of lower end of unspecified tibia, initial encounter for closed fracture: Secondary | ICD-10-CM | POA: Diagnosis not present

## 2018-03-14 ENCOUNTER — Other Ambulatory Visit: Payer: Self-pay | Admitting: Family Medicine

## 2018-03-22 DIAGNOSIS — L4050 Arthropathic psoriasis, unspecified: Secondary | ICD-10-CM | POA: Diagnosis not present

## 2018-03-22 DIAGNOSIS — M4690 Unspecified inflammatory spondylopathy, site unspecified: Secondary | ICD-10-CM | POA: Diagnosis not present

## 2018-03-22 DIAGNOSIS — L409 Psoriasis, unspecified: Secondary | ICD-10-CM | POA: Diagnosis not present

## 2018-03-26 ENCOUNTER — Other Ambulatory Visit: Payer: Self-pay | Admitting: Internal Medicine

## 2018-03-26 MED ORDER — FLUTICASONE PROPIONATE HFA 220 MCG/ACT IN AERO: 1.0000 | INHALATION_SPRAY | Freq: Two times a day (BID) | RESPIRATORY_TRACT | 2 refills | Status: DC

## 2018-03-28 DIAGNOSIS — M5136 Other intervertebral disc degeneration, lumbar region: Secondary | ICD-10-CM | POA: Diagnosis not present

## 2018-03-28 DIAGNOSIS — L4050 Arthropathic psoriasis, unspecified: Secondary | ICD-10-CM | POA: Diagnosis not present

## 2018-03-28 DIAGNOSIS — M545 Low back pain: Secondary | ICD-10-CM | POA: Diagnosis not present

## 2018-03-29 ENCOUNTER — Other Ambulatory Visit: Payer: Self-pay | Admitting: *Deleted

## 2018-03-29 MED ORDER — FLUTICASONE PROPIONATE HFA 220 MCG/ACT IN AERO: 1.0000 | INHALATION_SPRAY | Freq: Two times a day (BID) | RESPIRATORY_TRACT | 1 refills | Status: DC

## 2018-04-02 DIAGNOSIS — R5383 Other fatigue: Secondary | ICD-10-CM | POA: Diagnosis not present

## 2018-04-04 ENCOUNTER — Other Ambulatory Visit: Payer: Self-pay | Admitting: *Deleted

## 2018-04-04 MED ORDER — FLUTICASONE PROPIONATE HFA 220 MCG/ACT IN AERO: 2.0000 | INHALATION_SPRAY | Freq: Two times a day (BID) | RESPIRATORY_TRACT | 1 refills | Status: DC

## 2018-04-04 NOTE — Progress Notes (Signed)
Updated rx sent

## 2018-04-09 DIAGNOSIS — S82309A Unspecified fracture of lower end of unspecified tibia, initial encounter for closed fracture: Secondary | ICD-10-CM | POA: Diagnosis not present

## 2018-04-09 DIAGNOSIS — G4733 Obstructive sleep apnea (adult) (pediatric): Secondary | ICD-10-CM | POA: Diagnosis not present

## 2018-04-25 DIAGNOSIS — G4733 Obstructive sleep apnea (adult) (pediatric): Secondary | ICD-10-CM | POA: Diagnosis not present

## 2018-04-25 DIAGNOSIS — S82309A Unspecified fracture of lower end of unspecified tibia, initial encounter for closed fracture: Secondary | ICD-10-CM | POA: Diagnosis not present

## 2018-05-02 DIAGNOSIS — M7061 Trochanteric bursitis, right hip: Secondary | ICD-10-CM | POA: Diagnosis not present

## 2018-05-14 DIAGNOSIS — J069 Acute upper respiratory infection, unspecified: Secondary | ICD-10-CM | POA: Diagnosis not present

## 2018-05-22 DIAGNOSIS — R5383 Other fatigue: Secondary | ICD-10-CM | POA: Diagnosis not present

## 2018-05-24 DIAGNOSIS — M545 Low back pain: Secondary | ICD-10-CM | POA: Diagnosis not present

## 2018-05-24 DIAGNOSIS — M47816 Spondylosis without myelopathy or radiculopathy, lumbar region: Secondary | ICD-10-CM | POA: Diagnosis not present

## 2018-06-11 ENCOUNTER — Emergency Department: Payer: 59

## 2018-06-11 ENCOUNTER — Other Ambulatory Visit: Payer: Self-pay

## 2018-06-11 ENCOUNTER — Emergency Department
Admission: EM | Admit: 2018-06-11 | Discharge: 2018-06-11 | Disposition: A | Discharge status: Home / Self Care | Payer: 59 | Attending: Student in an Organized Health Care Education/Training Program | Admitting: Student in an Organized Health Care Education/Training Program

## 2018-06-11 DIAGNOSIS — R519 Headache, unspecified: Secondary | ICD-10-CM

## 2018-06-11 DIAGNOSIS — Z79899 Other long term (current) drug therapy: Secondary | ICD-10-CM | POA: Diagnosis not present

## 2018-06-11 DIAGNOSIS — R0789 Other chest pain: Secondary | ICD-10-CM | POA: Diagnosis not present

## 2018-06-11 DIAGNOSIS — R6884 Jaw pain: Secondary | ICD-10-CM | POA: Diagnosis not present

## 2018-06-11 DIAGNOSIS — R079 Chest pain, unspecified: Secondary | ICD-10-CM | POA: Diagnosis not present

## 2018-06-11 DIAGNOSIS — R51 Headache: Secondary | ICD-10-CM | POA: Insufficient documentation

## 2018-06-11 LAB — BASIC METABOLIC PANEL
Anion gap: 8 (ref 5–15)
BUN: 21 mg/dL — AB (ref 6–20)
CHLORIDE: 105 mmol/L (ref 98–111)
CO2: 27 mmol/L (ref 22–32)
Calcium: 9.8 mg/dL (ref 8.9–10.3)
Creatinine, Ser: 1.05 mg/dL — ABNORMAL HIGH (ref 0.44–1.00)
GFR calc Af Amer: >60 mL/min (ref >60)
GFR calc non Af Amer: >60 mL/min (ref >60)
GLUCOSE: 92 mg/dL (ref 70–99)
POTASSIUM: 4.2 mmol/L (ref 3.5–5.1)
Sodium: 140 mmol/L (ref 135–145)

## 2018-06-11 LAB — TROPONIN I
Troponin I: <0.03 ng/mL (ref <0.03)
Troponin I: <0.03 ng/mL (ref <0.03)

## 2018-06-11 LAB — CBC
HEMATOCRIT: 42.6 % (ref 35.0–47.0)
Hemoglobin: 14.9 g/dL (ref 12.0–16.0)
MCH: 29.3 pg (ref 26.0–34.0)
MCHC: 34.9 g/dL (ref 32.0–36.0)
MCV: 83.8 fL (ref 80.0–100.0)
Platelets: 421 10*3/uL (ref 150–440)
RBC: 5.08 MIL/uL (ref 3.80–5.20)
RDW: 14.3 % (ref 11.5–14.5)
WBC: 8.8 10*3/uL (ref 3.6–11.0)

## 2018-06-11 LAB — FIBRIN DERIVATIVES D-DIMER (ARMC ONLY): FIBRIN DERIVATIVES D-DIMER (ARMC): 333.63 ng{FEU}/mL (ref 0.00–499.00)

## 2018-06-11 MED ORDER — ACETAMINOPHEN 500 MG PO TABS
ORAL_TABLET | ORAL | Status: AC
  Filled 2018-06-11: qty 2

## 2018-06-11 MED ORDER — PROCHLORPERAZINE EDISYLATE 10 MG/2ML IJ SOLN
10.0000 mg | Freq: Once | INTRAMUSCULAR | Status: AC
  Administered 2018-06-11: 10 mg via INTRAVENOUS
  Filled 2018-06-11: qty 2

## 2018-06-11 MED ORDER — ACETAMINOPHEN 500 MG PO TABS
1000.0000 mg | ORAL_TABLET | Freq: Once | ORAL | Status: AC
  Administered 2018-06-11: 1000 mg via ORAL

## 2018-06-11 MED ORDER — GI COCKTAIL ~~LOC~~
30.0000 mL | Freq: Once | ORAL | Status: AC
  Administered 2018-06-11: 30 mL via ORAL
  Filled 2018-06-11: qty 30

## 2018-06-11 NOTE — Discharge Instructions (Addendum)

## 2018-06-11 NOTE — ED Provider Notes (Signed)
[facility] Emergency Department Provider Note    First MD Initiated Contact with Patient 06/11/18 1920     (approximate)  I have reviewed the triage vital signs and the nursing notes.   HISTORY  Chief Complaint Chest Pain    HPI Crystal Houston is a 47 y.o. female since the ER with 3 days of epigastric discomfort radiating her chest associated with jaw ache.  Patient thought it was initially gastritis and heartburn is been taking Tums without any relief.  States the pain is mild in severity.  No fevers but does have some discomfort when taking a deep breath.  Denies any nausea or vomiting.  Denies any history of heart disease she does not smoke.  No lower extremity swelling.    Past Medical History:  Diagnosis Date  . Anxiety   . Depression   . OSA on CPAP 08/10/2017   Family History  Problem Relation Age of Onset  . Cancer Daughter        osteosarcoma of leg  . Cancer Paternal Grandfather        colon   Past Surgical History:  Procedure Laterality Date  . FRACTURE SURGERY    . I&D EXTREMITY  11/10/2011   Procedure: IRRIGATION AND DEBRIDEMENT EXTREMITY;  Surgeon: Loanne Drilling;  [facility];  Service: Orthopedics;  Laterality: Right;  . ORIF ANKLE FRACTURE  11/10/2011   Procedure: OPEN REDUCTION INTERNAL FIXATION (ORIF) ANKLE FRACTURE;  Surgeon: Loanne Drilling;  Location: [facility];  Service: Orthopedics;  Laterality: Right;  with screw applicaton   Patient Active Problem List   Diagnosis Date Noted  . Right leg pain 09/12/2017  . Psoriatic arthritis (HCC) 08/10/2017  . OSA on CPAP 08/10/2017  . Depression with anxiety 05/11/2017  . Fatigue 05/03/2016  . Contraception management 12/29/2015  . Obesity 05/03/2011      Prior to Admission medications   Medication Sig Start Date End Date Taking? Authorizing Provider  albuterol (PROVENTIL HFA;VENTOLIN HFA) 108 (90 Base) MCG/ACT inhaler Inhale 2 puffs into the lungs every 6 (six) hours as  needed for wheezing or shortness of breath. 08/31/17   Shane Crutch, MD  ARMOUR THYROID 60 MG tablet Take 60 mg by mouth daily. 02/13/18   [provider]  cetirizine (ZYRTEC) 10 MG tablet Take 10 mg by mouth as needed.     [provider]  clonazePAM (KLONOPIN) 0.5 MG tablet Take 1 tablet (0.5 mg total) by mouth 3 (three) times daily as needed for anxiety. 05/05/17   Dianne Dun, MD  COSENTYX SENSOREADY 300 DOSE 150 MG/ML SOAJ Inject 1 each into the skin every 30 (thirty) days. 02/13/18   [provider]  fluticasone (FLOVENT HFA) 220 MCG/ACT inhaler Inhale 2 puffs into the lungs 2 (two) times daily. Rinse mouth after use. 04/04/18 04/04/19  Shane Crutch, MD  ibuprofen (ADVIL,MOTRIN) 200 MG tablet Take 200 mg by mouth every 6 (six) hours as needed.    [provider]  LYRICA 150 MG capsule Take 150 mg by mouth 2 (two) times daily. 02/01/18   [provider]  naproxen (NAPROSYN) 500 MG tablet Take 500 mg by mouth 2 (two) times daily. 11/30/17   [provider]  testosterone cypionate (DEPOTESTOSTERONE CYPIONATE) 200 MG/ML injection Inject 0.4 mg into the muscle once a week.    [provider]  venlafaxine XR (EFFEXOR-XR) 37.5 MG 24 hr capsule TAKE 1 CAPSULE BY MOUTH  DAILY WITH BREAKFAST 03/14/18  Dianne Dun, MD    Allergies Patient has no known allergies.    Social History Social History   Tobacco Use  . Smoking status: Never Smoker  . Smokeless tobacco: Never Used  Substance Use Topics  . Alcohol use: Yes    Alcohol/week: 1.2 oz    Types: 2 Glasses of wine per week    Comment: Very rare  . Drug use: No    Review of Systems Patient denies headaches, rhinorrhea, blurry vision, numbness, shortness of breath, chest pain, edema, cough, abdominal pain, nausea, vomiting, diarrhea, dysuria, fevers, rashes or hallucinations unless otherwise stated above in  HPI. ____________________________________________   PHYSICAL EXAM:  VITAL SIGNS: Vitals:   06/11/18 1613 06/11/18 1852  BP: (!) 117/92 (!) 158/96  Pulse: (!) 124 (!) 104  Resp: 18 16  Temp: 98.8 F (37.1 C) 98.1 F (36.7 C)  SpO2: 100% 96%    Constitutional: Alert and oriented.  Eyes: Conjunctivae are normal.  Head: Atraumatic. Nose: No congestion/rhinnorhea. Mouth/Throat: Mucous membranes are moist.   Neck: No stridor. Painless ROM.  Cardiovascular: Normal rate, regular rhythm. Grossly normal heart sounds.  Good peripheral circulation. Respiratory: Normal respiratory effort.  No retractions. Lungs CTAB. Gastrointestinal: Soft and nontender. No distention. No abdominal bruits. No CVA tenderness. Genitourinary:  Musculoskeletal: No lower extremity tenderness nor edema.  No joint effusions. Neurologic:  Normal speech and language. No gross focal neurologic deficits are appreciated. No facial droop Skin:  Skin is warm, dry and intact. No rash noted. Psychiatric: Mood and affect are normal. Speech and behavior are normal.  ____________________________________________   LABS (all labs ordered are listed, but only abnormal results are displayed)  Results for orders placed or performed during the hospital encounter of 06/11/18 (from the past 24 hour(s))  Basic metabolic panel     Status: Abnormal   Collection Time: 06/11/18  4:15 PM  Result Value Ref Range   Sodium 140 135 - 145 mmol/L   Potassium 4.2 3.5 - 5.1 mmol/L   Chloride 105 98 - 111 mmol/L   CO2 27 22 - 32 mmol/L   Glucose, Bld 92 70 - 99 mg/dL   BUN 21 (H) 6 - 20 mg/dL   Creatinine, Ser 6.04 (H) 0.44 - 1.00 mg/dL   Calcium 9.8 8.9 - 54.0 mg/dL   GFR calc non Af Amer >60 >60 mL/min   GFR calc Af Amer >60 >60 mL/min   Anion gap 8 5 - 15  CBC     Status: None   Collection Time: 06/11/18  4:15 PM  Result Value Ref Range   WBC 8.8 3.6 - 11.0 K/uL   RBC 5.08 3.80 - 5.20 MIL/uL   Hemoglobin 14.9 12.0 - 16.0 g/dL    HCT 98.1 19.1 - 47.8 %   MCV 83.8 80.0 - 100.0 fL   MCH 29.3 26.0 - 34.0 pg   MCHC 34.9 32.0 - 36.0 g/dL   RDW 29.5 62.1 - 30.8 %   Platelets 421 150 - 440 K/uL  Troponin I     Status: None   Collection Time: 06/11/18  4:15 PM  Result Value Ref Range   Troponin I <0.03 <0.03 ng/mL   ____________________________________________  EKG My review and personal interpretation at Time: 16:12   Indication: chest discomfort  Rate: 130  Rhythm: sinus Axis: leftward Other: normal intervals, non specific st change, no pr depression ____________________________________________  RADIOLOGY  I personally reviewed all radiographic images ordered to evaluate for the above acute complaints and  reviewed radiology reports and findings.  These findings were personally discussed with the patient.  Please see medical record for radiology report.  ____________________________________________   PROCEDURES  Procedure(s) performed:  Procedures    Critical Care performed: no ____________________________________________   INITIAL IMPRESSION / ASSESSMENT AND PLAN / ED COURSE  Pertinent labs & imaging results that were available during my care of the patient were reviewed by me and considered in my medical decision making (see chart for details).   DDX: ACS, pericarditis, esophagitis, boerhaaves, pe, dissection, pna, bronchitis, costochondritis   Everlena CooperWendolyn M Mccard is a 47 y.o. who presents to the ED with symptoms as described above.  Patient well-appearing and nontoxic but is tachycardic.  Patient low risk by heart score but will be further stratified with repeat troponin.  Patient is tachycardic and does have some pain with deep inspiration given the tachycardia will order d-dimer to further risk stratify for PE.  Based on her exam and history seems most clinically consistent with some component of epigastric discomfort and gastritis.  No pain in the right upper quadrant to suggest biliary pathology.   The patient will be placed on continuous pulse oximetry and telemetry for monitoring.  Laboratory evaluation will be sent to evaluate for the above complaints.     Clinical Course as of Jun 11 2054  Mon Jun 11, 2018  2055 Patient reassessed.  Pain resolved.  Headache also resolved.  Repeat troponin is negative d-dimer is negative.  At this point I do believe patient stable and appropriate for outpatient follow-up.   [PR]    Clinical Course User Index [PR] Willy Eddyobinson, Kaela Beitz, MD     As part of my medical decision making, I reviewed the following data within the electronic MEDICAL RECORD NUMBER Nursing notes reviewed and incorporated, Labs reviewed, notes from prior ED visits .   ____________________________________________   FINAL CLINICAL IMPRESSION(S) / ED DIAGNOSES  Final diagnoses:  Chest pain, unspecified type  Acute nonintractable headache, unspecified headache type      NEW MEDICATIONS STARTED DURING THIS VISIT:  New Prescriptions   No medications on file     Note:  This document was prepared using Dragon voice recognition software and may include unintentional dictation errors.    Willy Eddyobinson, Leeah Politano, MD 06/11/18 [phone]

## 2018-06-11 NOTE — ED Notes (Signed)
Pt reports her chest pain has decreased and she feels better. Pt requesting discharge home. MD made aware.

## 2018-06-11 NOTE — ED Triage Notes (Signed)
CP X 3 days, states pressure to epigastric area. No hx of GERD. Pt alert and oriented X4, active, cooperative, pt in NAD. RR even and unlabored, color WNL.

## 2018-06-15 ENCOUNTER — Ambulatory Visit: Payer: 59 | Admitting: Cardiovascular Disease

## 2018-06-15 ENCOUNTER — Other Ambulatory Visit: Payer: Self-pay | Admitting: *Deleted

## 2018-06-15 ENCOUNTER — Telehealth: Payer: Self-pay | Admitting: *Deleted

## 2018-06-15 ENCOUNTER — Other Ambulatory Visit: Payer: Self-pay

## 2018-06-15 ENCOUNTER — Other Ambulatory Visit (HOSPITAL_COMMUNITY): Payer: Self-pay

## 2018-06-15 ENCOUNTER — Encounter: Payer: Self-pay | Admitting: Cardiovascular Disease

## 2018-06-15 ENCOUNTER — Ambulatory Visit (HOSPITAL_COMMUNITY): Payer: 59 | Attending: Cardiology

## 2018-06-15 VITALS — BP 110/84 | HR 78 | Ht 68.0 in | Wt 263.0 lb

## 2018-06-15 DIAGNOSIS — Z01812 Encounter for preprocedural laboratory examination: Secondary | ICD-10-CM | POA: Diagnosis not present

## 2018-06-15 DIAGNOSIS — I200 Unstable angina: Secondary | ICD-10-CM | POA: Insufficient documentation

## 2018-06-15 DIAGNOSIS — R079 Chest pain, unspecified: Secondary | ICD-10-CM | POA: Insufficient documentation

## 2018-06-15 DIAGNOSIS — Z6841 Body Mass Index (BMI) 40.0 and over, adult: Secondary | ICD-10-CM | POA: Diagnosis not present

## 2018-06-15 LAB — COMPREHENSIVE METABOLIC PANEL
A/G RATIO: 1.4 (ref 1.2–2.2)
ALBUMIN: 4 g/dL (ref 3.5–5.5)
ALK PHOS: 76 IU/L (ref 39–117)
ALT: 18 IU/L (ref 0–32)
AST: 12 IU/L (ref 0–40)
BILIRUBIN TOTAL: 0.6 mg/dL (ref 0.0–1.2)
BUN / CREAT RATIO: 13 (ref 9–23)
BUN: 11 mg/dL (ref 6–24)
CHLORIDE: 102 mmol/L (ref 96–106)
CO2: 23 mmol/L (ref 20–29)
Calcium: 8.9 mg/dL (ref 8.7–10.2)
Creatinine, Ser: 0.85 mg/dL (ref 0.57–1.00)
GFR calc non Af Amer: 82 mL/min/{1.73_m2} (ref >59)
GFR, EST AFRICAN AMERICAN: 94 mL/min/{1.73_m2} (ref >59)
GLUCOSE: 84 mg/dL (ref 65–99)
Globulin, Total: 2.8 g/dL (ref 1.5–4.5)
POTASSIUM: 5.3 mmol/L — AB (ref 3.5–5.2)
Sodium: 138 mmol/L (ref 134–144)
TOTAL PROTEIN: 6.8 g/dL (ref 6.0–8.5)

## 2018-06-15 LAB — CBC WITH DIFFERENTIAL/PLATELET
BASOS ABS: 0 10*3/uL (ref 0.0–0.2)
BASOS: 1 %
EOS (ABSOLUTE): 0.1 10*3/uL (ref 0.0–0.4)
Eos: 1 %
Hematocrit: 42.5 % (ref 34.0–46.6)
Hemoglobin: 14.8 g/dL (ref 11.1–15.9)
Immature Grans (Abs): 0 10*3/uL (ref 0.0–0.1)
Immature Granulocytes: 0 %
LYMPHS ABS: 1.5 10*3/uL (ref 0.7–3.1)
Lymphs: 24 %
MCH: 29.9 pg (ref 26.6–33.0)
MCHC: 34.8 g/dL (ref 31.5–35.7)
MCV: 86 fL (ref 79–97)
Monocytes Absolute: 0.6 10*3/uL (ref 0.1–0.9)
Monocytes: 9 %
NEUTROS ABS: 4.1 10*3/uL (ref 1.4–7.0)
Neutrophils: 65 %
PLATELETS: 371 10*3/uL (ref 150–450)
RBC: 4.95 x10E6/uL (ref 3.77–5.28)
RDW: 14.5 % (ref 12.3–15.4)
WBC: 6.2 10*3/uL (ref 3.4–10.8)

## 2018-06-15 LAB — ECHOCARDIOGRAM COMPLETE
HEIGHTINCHES: 68 in
WEIGHTICAEL: 4208 [oz_av]

## 2018-06-15 LAB — TSH: TSH: 0.967 u[IU]/mL (ref 0.450–4.500)

## 2018-06-15 MED ORDER — NITROGLYCERIN 0.4 MG SL SUBL: 0.4000 mg | SUBLINGUAL_TABLET | SUBLINGUAL | 3 refills | Status: DC | PRN

## 2018-06-15 MED ORDER — ASPIRIN EC 81 MG PO TBEC: 81.0000 mg | DELAYED_RELEASE_TABLET | Freq: Every day | ORAL | 3 refills | Status: DC

## 2018-06-15 MED ORDER — ISOSORBIDE MONONITRATE ER 30 MG PO TB24: 15.0000 mg | ORAL_TABLET | Freq: Every day | ORAL | 3 refills | Status: DC

## 2018-06-15 NOTE — Assessment & Plan Note (Signed)
Crystal Houston was referred by the emergency room for evaluation of new onset chest pain.  She has no cardiac risk factors.  The pain began a week ago today and has been occurring on a daily basis with radiation to her jaw.  She initially thought it was related to reflux.  She was seen in the ER on 06/11/2018.  Enzymes are negative and EKG showed no acute changes.  Based on the new onset symptoms and there is severity and description I am concerned that this is unstable angina.  I will start her on a baby aspirin as well as a long-term oral nitrate and I recommended that we proceed with outpatient radial diagnostic coronary angiography.The patient understands that risks included but are not limited to stroke (1 in 1000), death (1 in 1000), kidney failure [usually temporary] (1 in 500), bleeding (1 in 200), allergic reaction [possibly serious] (1 in 200). The patient understands and agrees to proceed

## 2018-06-15 NOTE — Patient Instructions (Addendum)
Medication Instructions:  Your physician has recommended you make the following change in your medication:  1) START Aspirin 81 mg  2) START IMDUR 15 mg - Take half tablet by mouth ONCE daily 3) START Nitro tablets - Take one tablet by under the tongue as needed for chest pain. You may take one tablet under the tongue every 5 minutes with a maximum of 3 tablets in 24 hours.   Labwork: Your physician recommends that you return for lab work in: TODAY    Testing/Procedures: Your physician has requested that you have an echocardiogram. Echocardiography is a painless test that uses sound waves to create images of your heart. It provides your doctor with information about the size and shape of your heart and how well your heart's chambers and valves are working. This procedure takes approximately one hour. There are no restrictions for this procedure.    Follow-Up: Your physician recommends that you schedule a follow-up appointment in: PENDING Cath    If you need a refill on your cardiac medications before your next appointment, please call your pharmacy.

## 2018-06-15 NOTE — Progress Notes (Signed)
   06/15/2018 Crystal Houston   11/29/1971  [id number]  Primary Physician Gosrani, Nimish C, MD Primary Cardiologist: Valene Villa J Talissa Apple MD FACP, FACC, FAHA, FSCAI  HPI:  Crystal Houston is a 47 y.o. moderately overweight engaged Caucasian female mother of 2 children is accompanied by her Sister Crystal Houston.  Was referred by the emergency room for new onset chest pain.  She has no cardiac risk factors.  She does have obstructive sleep apnea.  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  She is never had a heart attack or stroke.  She began having chest pain a week ago.  It occurred on a daily basis and radiates to her jaw.  She was evaluated in the emergency room on 06/11/2018 with negative enzymes and a normal EKG.  She continues to have chest pain since that time.   No outpatient medications have been marked as taking for the 06/15/18 encounter (Office Visit) with Ellisha Bankson J, MD.     No Known Allergies  Social History   Socioeconomic History  . Marital status: Married    Spouse name: Not on file  . Number of children: 3  . Years of education: Not on file  . Highest education level: Not on file  Occupational History  . Occupation: lab tech    Employer: [employer]  Social Needs  . Financial resource strain: Not on file  . Food insecurity:    Worry: Not on file    Inability: Not on file  . Transportation needs:    Medical: Not on file    Non-medical: Not on file  Tobacco Use  . Smoking status: Never Smoker  . Smokeless tobacco: Never Used  Substance and Sexual Activity  . Alcohol use: Yes    Alcohol/week: 1.2 oz    Types: 2 Glasses of wine per week    Comment: Very rare  . Drug use: No  . Sexual activity: Never    Birth control/protection: IUD  Lifestyle  . Physical activity:    Days per week: Not on file    Minutes per session: Not on file  . Stress: Not on file  Relationships  . Social connections:    Talks on phone: Not on file    Gets  together: Not on file    Attends religious service: Not on file    Active member of club or organization: Not on file    Attends meetings of clubs or organizations: Not on file    Relationship status: Not on file  . Intimate partner violence:    Fear of current or ex partner: Not on file    Emotionally abused: Not on file    Physically abused: Not on file    Forced sexual activity: Not on file  Other Topics Concern  . Not on file  Social History Narrative   Daughter died in 2014 of osteosarcoma     Review of Systems: General: negative for chills, fever, night sweats or weight changes.  Cardiovascular: negative for chest pain, dyspnea on exertion, edema, orthopnea, palpitations, paroxysmal nocturnal dyspnea or shortness of breath Dermatological: negative for rash Respiratory: negative for cough or wheezing Urologic: negative for hematuria Abdominal: negative for nausea, vomiting, diarrhea, bright red blood per rectum, melena, or hematemesis Neurologic: negative for visual changes, syncope, or dizziness All other systems reviewed and are otherwise negative except as noted above.    Blood pressure 110/84, pulse 78, height 5' 8" (1.727 m), weight 263   lb (119.3 kg).  General appearance: alert and no distress Neck: no adenopathy, no carotid bruit, no JVD, supple, symmetrical, trachea midline and thyroid not enlarged, symmetric, no tenderness/mass/nodules Lungs: clear to auscultation bilaterally Heart: regular rate and rhythm, S1, S2 normal, no murmur, click, rub or gallop Extremities: extremities normal, atraumatic, no cyanosis or edema Pulses: 2+ and symmetric Skin: Skin color, texture, turgor normal. No rashes or lesions Neurologic: Alert and oriented X 3, normal strength and tone. Normal symmetric reflexes. Normal coordination and gait  EKG normal sinus rhythm at 78 with poor R wave progression.  I personally reviewed this EKG.  ASSESSMENT AND PLAN:   Chest pain Ms. Gross was  referred by the emergency room for evaluation of new onset chest pain.  She has no cardiac risk factors.  The pain began a week ago today and has been occurring on a daily basis with radiation to her jaw.  She initially thought it was related to reflux.  She was seen in the ER on 06/11/2018.  Enzymes are negative and EKG showed no acute changes.  Based on the new onset symptoms and there is severity and description I am concerned that this is unstable angina.  I will start her on a baby aspirin as well as a long-term oral nitrate and I recommended that we proceed with outpatient radial diagnostic coronary angiography.The patient understands that risks included but are not limited to stroke (1 in 1000), death (1 in 1000), kidney failure [usually temporary] (1 in 500), bleeding (1 in 200), allergic reaction [possibly serious] (1 in 200). The patient understands and agrees to proceed      Crystal Rauda J. Leonia Heatherly MD FACP,FACC,FAHA, FSCAI 06/15/2018 10:08 AM 

## 2018-06-15 NOTE — H&P (View-Only) (Signed)
06/15/2018 Henry RusselWendolyn Helmut MusterM Houston   [birth year]  [id number]  Primary Physician Wilson SingerGosrani, Nimish C, MD Primary Cardiologist: Runell GessJonathan J Oluwatomisin Hustead MD Milagros LollFACP, FACC, [place], [place]  HPI:  Everlena CooperWendolyn M Dedeaux is a 47 y.o. moderately overweight engaged Caucasian female mother of 2 children is accompanied by her Sister Marcelino DusterMichelle.  Was referred by the emergency room for new onset chest pain.  She has no cardiac risk factors.  She does have obstructive sleep apnea.  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  She is never had a heart attack or stroke.  She began having chest pain a week ago.  It occurred on a daily basis and radiates to her jaw.  She was evaluated in the emergency room on 06/11/2018 with negative enzymes and a normal EKG.  She continues to have chest pain since that time.   No outpatient medications have been marked as taking for the 06/15/18 encounter (Office Visit) with Runell GessBerry, Sandralee Tarkington J, MD.     No Known Allergies  Social History   Socioeconomic History  . Marital status: Married    Spouse name: Not on file  . Number of children: 3  . Years of education: Not on file  . Highest education level: Not on file  Occupational History  . Occupation: Building control surveyorlab tech    Employer: [employer]  Social Needs  . Financial resource strain: Not on file  . Food insecurity:    Worry: Not on file    Inability: Not on file  . Transportation needs:    Medical: Not on file    Non-medical: Not on file  Tobacco Use  . Smoking status: Never Smoker  . Smokeless tobacco: Never Used  Substance and Sexual Activity  . Alcohol use: Yes    Alcohol/week: 1.2 oz    Types: 2 Glasses of wine per week    Comment: Very rare  . Drug use: No  . Sexual activity: Never    Birth control/protection: IUD  Lifestyle  . Physical activity:    Days per week: Not on file    Minutes per session: Not on file  . Stress: Not on file  Relationships  . Social connections:    Talks on phone: Not on file    Gets  together: Not on file    Attends religious service: Not on file    Active member of club or organization: Not on file    Attends meetings of clubs or organizations: Not on file    Relationship status: Not on file  . Intimate partner violence:    Fear of current or ex partner: Not on file    Emotionally abused: Not on file    Physically abused: Not on file    Forced sexual activity: Not on file  Other Topics Concern  . Not on file  Social History Narrative   Daughter died in 2014 of osteosarcoma     Review of Systems: General: negative for chills, fever, night sweats or weight changes.  Cardiovascular: negative for chest pain, dyspnea on exertion, edema, orthopnea, palpitations, paroxysmal nocturnal dyspnea or shortness of breath Dermatological: negative for rash Respiratory: negative for cough or wheezing Urologic: negative for hematuria Abdominal: negative for nausea, vomiting, diarrhea, bright red blood per rectum, melena, or hematemesis Neurologic: negative for visual changes, syncope, or dizziness All other systems reviewed and are otherwise negative except as noted above.    Blood pressure 110/84, pulse 78, height 5\' 8"  (1.727 m), weight 263  lb (119.3 kg).  General appearance: alert and no distress Neck: no adenopathy, no carotid bruit, no JVD, supple, symmetrical, trachea midline and thyroid not enlarged, symmetric, no tenderness/mass/nodules Lungs: clear to auscultation bilaterally Heart: regular rate and rhythm, S1, S2 normal, no murmur, click, rub or gallop Extremities: extremities normal, atraumatic, no cyanosis or edema Pulses: 2+ and symmetric Skin: Skin color, texture, turgor normal. No rashes or lesions Neurologic: Alert and oriented X 3, normal strength and tone. Normal symmetric reflexes. Normal coordination and gait  EKG normal sinus rhythm at 78 with poor R wave progression.  I personally reviewed this EKG.  ASSESSMENT AND PLAN:   Chest pain Ms. Ingram was  referred by the emergency room for evaluation of new onset chest pain.  She has no cardiac risk factors.  The pain began a week ago today and has been occurring on a daily basis with radiation to her jaw.  She initially thought it was related to reflux.  She was seen in the ER on 06/11/2018.  Enzymes are negative and EKG showed no acute changes.  Based on the new onset symptoms and there is severity and description I am concerned that this is unstable angina.  I will start her on a baby aspirin as well as a long-term oral nitrate and I recommended that we proceed with outpatient radial diagnostic coronary angiography.The patient understands that risks included but are not limited to stroke (1 in 1000), death (1 in 1000), kidney failure [usually temporary] (1 in 500), bleeding (1 in 200), allergic reaction [possibly serious] (1 in 200). The patient understands and agrees to proceed      Runell Gess MD [facility], [facility] 06/15/2018 10:08 AM

## 2018-06-15 NOTE — Telephone Encounter (Signed)
Spoke with pt, aware echo shows no wall motion abnormalities. Patient scheduled for LC&P Monday 06-18-18. Instructions discussed with the patient in detail.

## 2018-06-18 ENCOUNTER — Ambulatory Visit (HOSPITAL_COMMUNITY)
Admission: RE | Admit: 2018-06-18 | Discharge: 2018-06-18 | Disposition: A | Discharge status: Home / Self Care | Payer: 59 | Source: Ambulatory Visit | Attending: Cardiovascular Disease | Admitting: Cardiovascular Disease

## 2018-06-18 ENCOUNTER — Encounter (HOSPITAL_COMMUNITY)
Admission: RE | Disposition: A | Discharge status: Home / Self Care | Payer: Self-pay | Source: Ambulatory Visit | Attending: Cardiovascular Disease

## 2018-06-18 DIAGNOSIS — G4733 Obstructive sleep apnea (adult) (pediatric): Secondary | ICD-10-CM | POA: Insufficient documentation

## 2018-06-18 DIAGNOSIS — Z6839 Body mass index (BMI) 39.0-39.9, adult: Secondary | ICD-10-CM | POA: Insufficient documentation

## 2018-06-18 DIAGNOSIS — I200 Unstable angina: Secondary | ICD-10-CM | POA: Diagnosis not present

## 2018-06-18 DIAGNOSIS — Z975 Presence of (intrauterine) contraceptive device: Secondary | ICD-10-CM | POA: Diagnosis not present

## 2018-06-18 DIAGNOSIS — E663 Overweight: Secondary | ICD-10-CM | POA: Diagnosis not present

## 2018-06-18 DIAGNOSIS — R079 Chest pain, unspecified: Secondary | ICD-10-CM | POA: Diagnosis present

## 2018-06-18 HISTORY — PX: LEFT HEART CATH AND CORONARY ANGIOGRAPHY: CATH118249

## 2018-06-18 SURGERY — LEFT HEART CATH AND CORONARY ANGIOGRAPHY
Anesthesia: LOCAL

## 2018-06-18 MED ORDER — MIDAZOLAM HCL 2 MG/2ML IJ SOLN
INTRAMUSCULAR | Status: DC | PRN
  Administered 2018-06-18: 1 mg via INTRAVENOUS

## 2018-06-18 MED ORDER — SODIUM CHLORIDE 0.9 % IV SOLN: 250.0000 mL | INTRAVENOUS | Status: DC | PRN

## 2018-06-18 MED ORDER — HEPARIN (PORCINE) IN NACL 1000-0.9 UT/500ML-% IV SOLN
INTRAVENOUS | Status: AC
  Filled 2018-06-18: qty 1000

## 2018-06-18 MED ORDER — SODIUM CHLORIDE 0.9% FLUSH: 3.0000 mL | Freq: Two times a day (BID) | INTRAVENOUS | Status: DC

## 2018-06-18 MED ORDER — VERAPAMIL HCL 2.5 MG/ML IV SOLN
INTRAVENOUS | Status: AC
  Filled 2018-06-18: qty 2

## 2018-06-18 MED ORDER — LIDOCAINE HCL (PF) 1 % IJ SOLN
INTRAMUSCULAR | Status: DC | PRN
  Administered 2018-06-18: 2 mL via INTRADERMAL

## 2018-06-18 MED ORDER — HEPARIN SODIUM (PORCINE) 1000 UNIT/ML IJ SOLN
INTRAMUSCULAR | Status: DC | PRN
  Administered 2018-06-18: 5000 [IU] via INTRAVENOUS

## 2018-06-18 MED ORDER — IOHEXOL 350 MG/ML SOLN
INTRAVENOUS | Status: DC | PRN
  Administered 2018-06-18: 60 mL via INTRA_ARTERIAL

## 2018-06-18 MED ORDER — SODIUM CHLORIDE 0.9 % WEIGHT BASED INFUSION
3.0000 mL/kg/h | INTRAVENOUS | Status: AC
  Administered 2018-06-18: 3 mL/kg/h via INTRAVENOUS

## 2018-06-18 MED ORDER — HEPARIN (PORCINE) IN NACL 1000-0.9 UT/500ML-% IV SOLN
INTRAVENOUS | Status: DC | PRN
  Administered 2018-06-18 (×2): 500 mL

## 2018-06-18 MED ORDER — ASPIRIN 81 MG PO CHEW: 81.0000 mg | CHEWABLE_TABLET | ORAL | Status: DC

## 2018-06-18 MED ORDER — VERAPAMIL HCL 2.5 MG/ML IV SOLN
INTRA_ARTERIAL | Status: DC | PRN
  Administered 2018-06-18: 5 mL via INTRA_ARTERIAL

## 2018-06-18 MED ORDER — MIDAZOLAM HCL 2 MG/2ML IJ SOLN
INTRAMUSCULAR | Status: AC
  Filled 2018-06-18: qty 2

## 2018-06-18 MED ORDER — FENTANYL CITRATE (PF) 100 MCG/2ML IJ SOLN
INTRAMUSCULAR | Status: AC
  Filled 2018-06-18: qty 2

## 2018-06-18 MED ORDER — NITROGLYCERIN 1 MG/10 ML FOR IR/CATH LAB
INTRA_ARTERIAL | Status: AC
  Filled 2018-06-18: qty 10

## 2018-06-18 MED ORDER — HEPARIN (PORCINE) IN NACL 1000-0.9 UT/500ML-% IV SOLN
INTRAVENOUS | Status: AC
  Filled 2018-06-18: qty 500

## 2018-06-18 MED ORDER — FENTANYL CITRATE (PF) 100 MCG/2ML IJ SOLN
INTRAMUSCULAR | Status: DC | PRN
  Administered 2018-06-18: 25 ug via INTRAVENOUS

## 2018-06-18 MED ORDER — LIDOCAINE HCL (PF) 1 % IJ SOLN
INTRAMUSCULAR | Status: AC
  Filled 2018-06-18: qty 30

## 2018-06-18 MED ORDER — SODIUM CHLORIDE 0.9 % WEIGHT BASED INFUSION: 1.0000 mL/kg/h | INTRAVENOUS | Status: DC

## 2018-06-18 MED ORDER — SODIUM CHLORIDE 0.9% FLUSH: 3.0000 mL | INTRAVENOUS | Status: DC | PRN

## 2018-06-18 SURGICAL SUPPLY — 13 items
CATH INFINITI 5FR ANG PIGTAIL (CATHETERS) ×1 IMPLANT
CATH OPTITORQUE TIG 4.0 5F (CATHETERS) ×1 IMPLANT
DEVICE RAD COMP TR BAND LRG (VASCULAR PRODUCTS) ×1 IMPLANT
GLIDESHEATH SLEND A-KIT 6F 22G (SHEATH) ×1 IMPLANT
GUIDEWIRE INQWIRE 1.5J.035X260 (WIRE) IMPLANT
HOVERMATT SINGLE USE (MISCELLANEOUS) ×1 IMPLANT
INQWIRE 1.5J .035X260CM (WIRE) ×2
KIT HEART LEFT (KITS) ×2 IMPLANT
PACK CARDIAC CATHETERIZATION (CUSTOM PROCEDURE TRAY) ×2 IMPLANT
SYR MEDRAD MARK V 150ML (SYRINGE) ×2 IMPLANT
TRANSDUCER W/STOPCOCK (MISCELLANEOUS) ×2 IMPLANT
TUBING CIL FLEX 10 FLL-RA (TUBING) ×2 IMPLANT
WIRE HI TORQ VERSACORE-J 145CM (WIRE) ×1 IMPLANT

## 2018-06-18 NOTE — Interval H&P Note (Signed)
Cath Lab Visit (complete for each Cath Lab visit)  Clinical Evaluation Leading to the Procedure:   ACS: No.  Non-ACS:    Anginal Classification: CCS II  Anti-ischemic medical therapy: No Therapy  Non-Invasive Test Results: No non-invasive testing performed  Prior CABG: No previous CABG      History and Physical Interval Note:  06/18/2018 2:51 PM  Crystal Houston  has presented today for surgery, with the diagnosis of unstable angina  The various methods of treatment have been discussed with the patient and family. After consideration of risks, benefits and other options for treatment, the patient has consented to  Procedure(s): LEFT HEART CATH AND CORONARY ANGIOGRAPHY (N/A) as a surgical intervention .  The patient's history has been reviewed, patient examined, no change in status, stable for surgery.  I have reviewed the patient's chart and labs.  Questions were answered to the patient's satisfaction.     Nanetta BattyJonathan Vadhir Mcnay

## 2018-06-18 NOTE — Discharge Instructions (Signed)

## 2018-06-18 NOTE — Research (Signed)
CADFEM Informed Consent   Subject Name: Crystal Houston  Subject met inclusion and exclusion criteria.  The informed consent form, study requirements and expectations were reviewed with the subject and questions and concerns were addressed prior to the signing of the consent form.  The subject verbalized understanding of the trail requirements.  The subject agreed to participate in the CADFEM trial and signed the informed consent.  The informed consent was obtained prior to performance of any protocol-specific procedures for the subject.  A copy of the signed informed consent was given to the subject and a copy was placed in the subject's medical record.  [name] 06/18/2018, 1:33 PM

## 2018-06-19 ENCOUNTER — Encounter (HOSPITAL_COMMUNITY): Payer: Self-pay | Admitting: Cardiovascular Disease

## 2018-06-19 ENCOUNTER — Encounter: Payer: Self-pay | Admitting: Cardiovascular Disease

## 2018-06-20 ENCOUNTER — Encounter: Payer: Self-pay | Admitting: Cardiovascular Disease

## 2018-06-25 DIAGNOSIS — R0600 Dyspnea, unspecified: Secondary | ICD-10-CM | POA: Diagnosis not present

## 2018-06-25 DIAGNOSIS — R5383 Other fatigue: Secondary | ICD-10-CM | POA: Diagnosis not present

## 2018-07-30 DIAGNOSIS — L600 Ingrowing nail: Secondary | ICD-10-CM | POA: Diagnosis not present

## 2018-07-31 ENCOUNTER — Ambulatory Visit: Payer: 59 | Admitting: Cardiovascular Disease

## 2018-07-31 ENCOUNTER — Encounter: Payer: Self-pay | Admitting: Cardiovascular Disease

## 2018-07-31 DIAGNOSIS — I208 Other forms of angina pectoris: Secondary | ICD-10-CM

## 2018-07-31 MED ORDER — ISOSORBIDE MONONITRATE ER 30 MG PO TB24: 15.0000 mg | ORAL_TABLET | Freq: Every day | ORAL | 3 refills | Status: DC

## 2018-07-31 NOTE — Progress Notes (Signed)
07/31/2018 Crystal Houston   [birth year]  [id number]  Primary Physician Wilson Singer, MD Primary Cardiologist: Runell Gess MD FACP, [place], [place], [place]  HPI:  Crystal Houston is a 47 y.o.  moderately overweight engaged Caucasian female mother of 2 children who I last saw in the office 06/15/2018. She was referred by the emergency room for new onset chest pain.  She has no cardiac risk factors.  She does have obstructive sleep apnea.  She works for [employer] in [employer] capacity.  She is never had a heart attack or stroke.  She began having chest pain a week ago.  It occurred on a daily basis and radiates to her jaw.  She was evaluated in the emergency room on 06/11/2018 with negative enzymes and a normal EKG.   Because her symptoms are somewhat concerning with jaw pain as well I elected to perform outpatient radial diagnostic coronary angiography 06/18/2018 which was entirely normal.  She has continued to have occasional episodes of nitrate responsive chest pain however.   Current Meds  Medication Sig  . albuterol (PROVENTIL HFA;VENTOLIN HFA) 108 (90 Base) MCG/ACT inhaler Inhale 2 puffs into the lungs every 6 (six) hours as needed for wheezing or shortness of breath.  Mack Guise THYROID 90 MG tablet Take 90 mg by mouth daily.   Marland Kitchen BIOTIN PO Take 1 tablet by mouth daily.  . Cholecalciferol (VITAMIN D3) 5000 units CAPS Take 5,000 Units by mouth daily.  . clonazePAM (KLONOPIN) 0.5 MG tablet Take 1 tablet (0.5 mg total) by mouth 3 (three) times daily as needed for anxiety.  . COSENTYX SENSOREADY 300 DOSE 150 MG/ML SOAJ Inject 150 mg into the skin every 28 (twenty-eight) days.   . fluticasone (FLOVENT HFA) 220 MCG/ACT inhaler Inhale 2 puffs into the lungs 2 (two) times daily. Rinse mouth after use.  . ibuprofen (ADVIL,MOTRIN) 200 MG tablet Take 400 mg by mouth every 6 (six) hours as needed for headache or moderate pain.   Marland Kitchen loratadine (CLARITIN) 10 MG tablet Take 10 mg by  mouth daily as needed for allergies.  . Misc Natural Products (OSTEO BI-FLEX ADV JOINT SHIELD PO) Take 1 tablet by mouth daily.  . naproxen (NAPROSYN) 500 MG tablet Take 500 mg by mouth 2 (two) times daily.  . nitroGLYCERIN (NITROSTAT) 0.4 MG SL tablet Place 1 tablet (0.4 mg total) under the tongue every 5 (five) minutes as needed for chest pain.  . NON FORMULARY Apply 0.2 mg topically daily. Testosterone 0.2 mg Cream  . TURMERIC PO Take 1 capsule by mouth daily.  Marland Kitchen venlafaxine XR (EFFEXOR-XR) 37.5 MG 24 hr capsule TAKE 1 CAPSULE BY MOUTH  DAILY WITH BREAKFAST     No Known Allergies  Social History   Socioeconomic History  . Marital status: Married    Spouse name: Not on file  . Number of children: 3  . Years of education: Not on file  . Highest education level: Not on file  Occupational History  . Occupation: Building control surveyor: [employer]  Social Needs  . Financial resource strain: Not on file  . Food insecurity:    Worry: Not on file    Inability: Not on file  . Transportation needs:    Medical: Not on file    Non-medical: Not on file  Tobacco Use  . Smoking status: Never Smoker  . Smokeless tobacco: Never Used  Substance and Sexual Activity  . Alcohol use: Yes  Alcohol/week: 2.0 standard drinks    Types: 2 Glasses of wine per week    Comment: Very rare  . Drug use: No  . Sexual activity: Never    Birth control/protection: IUD  Lifestyle  . Physical activity:    Days per week: Not on file    Minutes per session: Not on file  . Stress: Not on file  Relationships  . Social connections:    Talks on phone: Not on file    Gets together: Not on file    Attends religious service: Not on file    Active member of club or organization: Not on file    Attends meetings of clubs or organizations: Not on file    Relationship status: Not on file  . Intimate partner violence:    Fear of current or ex partner: Not on file    Emotionally abused: Not on file    Physically  abused: Not on file    Forced sexual activity: Not on file  Other Topics Concern  . Not on file  Social History Narrative   Daughter died in 2014 of osteosarcoma     Review of Systems: General: negative for chills, fever, night sweats or weight changes.  Cardiovascular: negative for chest pain, dyspnea on exertion, edema, orthopnea, palpitations, paroxysmal nocturnal dyspnea or shortness of breath Dermatological: negative for rash Respiratory: negative for cough or wheezing Urologic: negative for hematuria Abdominal: negative for nausea, vomiting, diarrhea, bright red blood per rectum, melena, or hematemesis Neurologic: negative for visual changes, syncope, or dizziness All other systems reviewed and are otherwise negative except as noted above.    Blood pressure 118/84, pulse 76, height 5\' 8"  (1.727 m), weight 264 lb 3.2 oz (119.8 kg), SpO2 98 %.  General appearance: alert and no distress Neck: no adenopathy, no carotid bruit, no JVD, supple, symmetrical, trachea midline and thyroid not enlarged, symmetric, no tenderness/mass/nodules Lungs: clear to auscultation bilaterally Heart: regular rate and rhythm, S1, S2 normal, no murmur, click, rub or gallop Extremities: extremities normal, atraumatic, no cyanosis or edema Pulses: 2+ and symmetric Skin: Skin color, texture, turgor normal. No rashes or lesions Neurologic: Alert and oriented X 3, normal strength and tone. Normal symmetric reflexes. Normal coordination and gait  EKG not performed today  ASSESSMENT AND PLAN:   Chest pain [name] returns today for follow-up of her outpatient radial diagnostic cath performed 06/18/2018 evaluation of chest pain.  This is entirely normal including normal coronary arteries and normal LV function.  I thought her chest pain was noncardiac.  She has had some subsequent episodes which were less severe but which were nitrate responsive.  She may have coronary vasospasm.  I am beginning her on  low-dose long-acting Imdur to see if this improves her symptoms.      Runell GessJonathan J. Arwen Haseley MD FACP,FACC,FAHA, [facility] 07/31/2018 11:14 AM

## 2018-07-31 NOTE — Patient Instructions (Signed)
Medication Instructions:   START ISOSORBIDE 15 MG ONCE DAILY=1/2 OF THE 30 MG TABLET ONCE DAILY  Follow-Up:  Your physician wants you to follow-up in: 3 MONTHS WITH APP You will receive a reminder letter in the mail two months in advance. If you don't receive a letter, please call our office to schedule the follow-up appointment.   Your physician wants you to follow-up in: ONE YEAR WITH DR San MorelleBERRY You will receive a reminder letter in the mail two months in advance. If you don't receive a letter, please call our office to schedule the follow-up appointment.   If you need a refill on your cardiac medications before your next appointment, please call your pharmacy.

## 2018-07-31 NOTE — Assessment & Plan Note (Signed)
Ms. Crystal Houston returns today for follow-up of her outpatient radial diagnostic cath performed 06/18/2018 evaluation of chest pain.  This is entirely normal including normal coronary arteries and normal LV function.  I thought her chest pain was noncardiac.  She has had some subsequent episodes which were less severe but which were nitrate responsive.  She may have coronary vasospasm.  I am beginning her on low-dose long-acting Imdur to see if this improves her symptoms.

## 2018-08-17 DIAGNOSIS — M79675 Pain in left toe(s): Secondary | ICD-10-CM | POA: Diagnosis not present

## 2018-08-17 DIAGNOSIS — L600 Ingrowing nail: Secondary | ICD-10-CM | POA: Diagnosis not present

## 2018-08-22 DIAGNOSIS — R5383 Other fatigue: Secondary | ICD-10-CM | POA: Diagnosis not present

## 2018-08-22 DIAGNOSIS — N951 Menopausal and female climacteric states: Secondary | ICD-10-CM | POA: Diagnosis not present

## 2018-08-24 ENCOUNTER — Other Ambulatory Visit: Payer: Self-pay

## 2018-08-24 MED ORDER — ISOSORBIDE MONONITRATE ER 30 MG PO TB24: 15.0000 mg | ORAL_TABLET | Freq: Every day | ORAL | 3 refills | Status: DC

## 2018-09-05 DIAGNOSIS — M4690 Unspecified inflammatory spondylopathy, site unspecified: Secondary | ICD-10-CM | POA: Diagnosis not present

## 2018-09-05 DIAGNOSIS — L4050 Arthropathic psoriasis, unspecified: Secondary | ICD-10-CM | POA: Diagnosis not present

## 2018-09-05 DIAGNOSIS — Z23 Encounter for immunization: Secondary | ICD-10-CM | POA: Diagnosis not present

## 2018-09-05 DIAGNOSIS — L409 Psoriasis, unspecified: Secondary | ICD-10-CM | POA: Diagnosis not present

## 2018-09-14 NOTE — Progress Notes (Signed)
[facility] [place] [facility] Consultation      Assessment and Plan:  Obstructive sleep apnea. -HST 06/25/17; OSA with AHI of 7.Continue CPAP setting to 7-15 cm H2O. - Currently non-compliant with CPAP, recommend restarting CPAP nightly.   Allergic asthma. -Symptoms are worse during the allergy seasons, therefore she is asked to restart Flovent in the winter months if her symptoms recur. - Recommended we remove pets from the bedroom.   Anxiety/depression. -Obstructive sleep apnea can contribute to anxiety and depression, therefore, is important treat sleep apnea if present.  Return in about 1 year (around 09/18/2019).    Date: 09/14/2018  MRN# [id number] Crystal Houston [birth year]    Crystal Houston is a 47 y.o. old female seen in consultation for chief complaint of:    Chief Complaint  Patient presents with  . Asthma    no exasperations   . Shortness of Breath    due to artery spasms, chest pain and chest pressure    HPI:  The patient is a 47 year old female, last visit she was asked to continue Qvar and use of CPAP. She has a 3 dogs amd 2 cats at home, occasionally in bed. She does have seasonal allergies with runny nose and takes benadryl in the fall. The cats are in the bedroom.  Since her last visit her breathing has been doing well. Her issues are usually in the fall/winter. She has not had to use a maintenance or rescue inhaler.  She has CPAP, her mask was changed so she has not been using it. She is now feeling more sleepy during the day.   **CPAP download 06/18/2018-10/12/201>> raw data personally reviewed.  Usage greater than 4 hours is 4/90 days.  Average usage on days used was 3 hours 6 minutes.  Pressure ranges 7-15.  Median pressure 9, 95th percentile pressure 10, maximum pressure 11.  Residual AHI is 3.8.  Overall this shows noncompliance with CPAP with adequate control of obstructive sleep apnea when used. **Download data personally reviewed, 30 days as  of 03/13/18: Uses greater than 4 hours is 13/30 days.  Average usage on days used is 4 hours 25 minutes.  Pressure setting is 7-15.  Median pressure is 9, 95th percentile pressure is 11, maximum pressure of 12.  Residual AHI is 1. **HST 06/25/17>> Mild OSA with AHI of 5.   Medication:    Current Outpatient Medications:  .  albuterol (PROVENTIL HFA;VENTOLIN HFA) 108 (90 Base) MCG/ACT inhaler, Inhale 2 puffs into the lungs every 6 (six) hours as needed for wheezing or shortness of breath., Disp: 3 Inhaler, Rfl: 2 .  ARMOUR THYROID 90 MG tablet, Take 90 mg by mouth daily. , Disp: , Rfl:  .  BIOTIN PO, Take 1 tablet by mouth daily., Disp: , Rfl:  .  Cholecalciferol (VITAMIN D3) 5000 units CAPS, Take 5,000 Units by mouth daily., Disp: , Rfl:  .  clonazePAM (KLONOPIN) 0.5 MG tablet, Take 1 tablet (0.5 mg total) by mouth 3 (three) times daily as needed for anxiety., Disp: 30 tablet, Rfl: 0 .  COSENTYX SENSOREADY 300 DOSE 150 MG/ML SOAJ, Inject 150 mg into the skin every 28 (twenty-eight) days. , Disp: , Rfl:  .  fluticasone (FLOVENT HFA) 220 MCG/ACT inhaler, Inhale 2 puffs into the lungs 2 (two) times daily. Rinse mouth after use., Disp: 36 g, Rfl: 1 .  ibuprofen (ADVIL,MOTRIN) 200 MG tablet, Take 400 mg by mouth every 6 (six) hours as needed for headache or moderate pain. , Disp: ,  Rfl:  .  isosorbide mononitrate (IMDUR) 30 MG 24 hr tablet, Take 0.5 tablets (15 mg total) by mouth daily., Disp: 45 tablet, Rfl: 3 .  loratadine (CLARITIN) 10 MG tablet, Take 10 mg by mouth daily as needed for allergies., Disp: , Rfl:  .  Misc Natural Products (OSTEO BI-FLEX ADV JOINT SHIELD PO), Take 1 tablet by mouth daily., Disp: , Rfl:  .  naproxen (NAPROSYN) 500 MG tablet, Take 500 mg by mouth 2 (two) times daily., Disp: , Rfl:  .  nitroGLYCERIN (NITROSTAT) 0.4 MG SL tablet, Place 1 tablet (0.4 mg total) under the tongue every 5 (five) minutes as needed for chest pain., Disp: 90 tablet, Rfl: 3 .  NON FORMULARY, Apply 0.2  mg topically daily. Testosterone 0.2 mg Cream, Disp: , Rfl:  .  TURMERIC PO, Take 1 capsule by mouth daily., Disp: , Rfl:  .  venlafaxine XR (EFFEXOR-XR) 37.5 MG 24 hr capsule, TAKE 1 CAPSULE BY MOUTH  DAILY WITH BREAKFAST, Disp: 90 capsule, Rfl: 0   Allergies:  Patient has no known allergies.      LABORATORY PANEL:   CBC No results for input(s): WBC, HGB, HCT, PLT in the last 168 hours. ------------------------------------------------------------------------------------------------------------------  Chemistries  No results for input(s): NA, K, CL, CO2, GLUCOSE, BUN, CREATININE, CALCIUM, MG, AST, ALT, ALKPHOS, BILITOT in the last 168 hours.  Invalid input(s): GFRCGP ------------------------------------------------------------------------------------------------------------------  Cardiac Enzymes No results for input(s): TROPONINI in the last 168 hours. ------------------------------------------------------------  RADIOLOGY:  No results found.     Thank  you for the consultation and for allowing [facility] [place] [facility] to assist in the care of your patient. Our recommendations are noted above.  Please contact us if we can be of further service.  Wells Guiles, M.D., F.C.C.P.  Board Certified in Internal Medicine, Pulmonary Medicine, Critical Care Medicine, and Sleep Medicine.  [place] [facility] Office Number: [phone]

## 2018-09-15 ENCOUNTER — Encounter: Payer: Self-pay | Admitting: Internal Medicine

## 2018-09-17 ENCOUNTER — Encounter: Payer: Self-pay | Admitting: Internal Medicine

## 2018-09-17 ENCOUNTER — Ambulatory Visit (INDEPENDENT_AMBULATORY_CARE_PROVIDER_SITE_OTHER): Payer: 59 | Admitting: Internal Medicine

## 2018-09-17 VITALS — BP 112/72 | HR 83 | Resp 16 | Ht 68.0 in | Wt 263.0 lb

## 2018-09-17 DIAGNOSIS — G4733 Obstructive sleep apnea (adult) (pediatric): Secondary | ICD-10-CM

## 2018-09-17 NOTE — Patient Instructions (Addendum)
Restart using CPAP every night for the whole night.  If asthma symptoms return, restart flovent 2 puffs twice daily, rinse mouth after use.  Remove pets from the bedroom.

## 2018-10-30 DIAGNOSIS — B372 Candidiasis of skin and nail: Secondary | ICD-10-CM | POA: Diagnosis not present

## 2018-10-30 DIAGNOSIS — Z1231 Encounter for screening mammogram for malignant neoplasm of breast: Secondary | ICD-10-CM | POA: Diagnosis not present

## 2018-11-12 DIAGNOSIS — E669 Obesity, unspecified: Secondary | ICD-10-CM | POA: Diagnosis not present

## 2018-11-12 DIAGNOSIS — Z0000 Encounter for general adult medical examination without abnormal findings: Secondary | ICD-10-CM | POA: Diagnosis not present

## 2018-11-12 DIAGNOSIS — R6882 Decreased libido: Secondary | ICD-10-CM | POA: Diagnosis not present

## 2018-11-15 DIAGNOSIS — Z131 Encounter for screening for diabetes mellitus: Secondary | ICD-10-CM | POA: Diagnosis not present

## 2018-11-15 DIAGNOSIS — E789 Disorder of lipoprotein metabolism, unspecified: Secondary | ICD-10-CM | POA: Diagnosis not present

## 2018-11-15 DIAGNOSIS — R5383 Other fatigue: Secondary | ICD-10-CM | POA: Diagnosis not present

## 2018-11-15 DIAGNOSIS — R6883 Chills (without fever): Secondary | ICD-10-CM | POA: Diagnosis not present

## 2018-11-22 DIAGNOSIS — L659 Nonscarring hair loss, unspecified: Secondary | ICD-10-CM | POA: Diagnosis not present

## 2018-12-08 DIAGNOSIS — R5383 Other fatigue: Secondary | ICD-10-CM | POA: Diagnosis not present

## 2018-12-18 DIAGNOSIS — R5383 Other fatigue: Secondary | ICD-10-CM | POA: Diagnosis not present

## 2018-12-18 DIAGNOSIS — L659 Nonscarring hair loss, unspecified: Secondary | ICD-10-CM | POA: Diagnosis not present

## 2018-12-18 DIAGNOSIS — R6882 Decreased libido: Secondary | ICD-10-CM | POA: Diagnosis not present

## 2019-01-15 DIAGNOSIS — M25512 Pain in left shoulder: Secondary | ICD-10-CM | POA: Diagnosis not present

## 2019-01-15 DIAGNOSIS — M25511 Pain in right shoulder: Secondary | ICD-10-CM | POA: Diagnosis not present

## 2019-01-15 DIAGNOSIS — M7501 Adhesive capsulitis of right shoulder: Secondary | ICD-10-CM | POA: Diagnosis not present

## 2019-01-21 DIAGNOSIS — Z01419 Encounter for gynecological examination (general) (routine) without abnormal findings: Secondary | ICD-10-CM | POA: Diagnosis not present

## 2019-01-21 DIAGNOSIS — Z6838 Body mass index (BMI) 38.0-38.9, adult: Secondary | ICD-10-CM | POA: Diagnosis not present

## 2019-01-21 DIAGNOSIS — Z124 Encounter for screening for malignant neoplasm of cervix: Secondary | ICD-10-CM | POA: Diagnosis not present

## 2019-01-22 DIAGNOSIS — Z124 Encounter for screening for malignant neoplasm of cervix: Secondary | ICD-10-CM | POA: Diagnosis not present

## 2019-01-25 DIAGNOSIS — M7061 Trochanteric bursitis, right hip: Secondary | ICD-10-CM | POA: Diagnosis not present

## 2019-01-28 DIAGNOSIS — M7501 Adhesive capsulitis of right shoulder: Secondary | ICD-10-CM | POA: Diagnosis not present

## 2019-01-28 DIAGNOSIS — M25511 Pain in right shoulder: Secondary | ICD-10-CM | POA: Diagnosis not present

## 2019-02-01 DIAGNOSIS — M25511 Pain in right shoulder: Secondary | ICD-10-CM | POA: Diagnosis not present

## 2019-02-01 DIAGNOSIS — M7501 Adhesive capsulitis of right shoulder: Secondary | ICD-10-CM | POA: Diagnosis not present

## 2019-02-04 DIAGNOSIS — M7501 Adhesive capsulitis of right shoulder: Secondary | ICD-10-CM | POA: Diagnosis not present

## 2019-02-04 DIAGNOSIS — M25511 Pain in right shoulder: Secondary | ICD-10-CM | POA: Diagnosis not present

## 2019-02-08 DIAGNOSIS — M25511 Pain in right shoulder: Secondary | ICD-10-CM | POA: Diagnosis not present

## 2019-02-08 DIAGNOSIS — M7501 Adhesive capsulitis of right shoulder: Secondary | ICD-10-CM | POA: Diagnosis not present

## 2019-02-11 DIAGNOSIS — M25511 Pain in right shoulder: Secondary | ICD-10-CM | POA: Diagnosis not present

## 2019-02-11 DIAGNOSIS — M7501 Adhesive capsulitis of right shoulder: Secondary | ICD-10-CM | POA: Diagnosis not present

## 2019-02-12 DIAGNOSIS — M7061 Trochanteric bursitis, right hip: Secondary | ICD-10-CM | POA: Diagnosis not present

## 2019-02-15 DIAGNOSIS — M7501 Adhesive capsulitis of right shoulder: Secondary | ICD-10-CM | POA: Diagnosis not present

## 2019-02-15 DIAGNOSIS — M25511 Pain in right shoulder: Secondary | ICD-10-CM | POA: Diagnosis not present

## 2019-02-18 DIAGNOSIS — M25511 Pain in right shoulder: Secondary | ICD-10-CM | POA: Diagnosis not present

## 2019-02-18 DIAGNOSIS — M7501 Adhesive capsulitis of right shoulder: Secondary | ICD-10-CM | POA: Diagnosis not present

## 2019-02-19 DIAGNOSIS — R6882 Decreased libido: Secondary | ICD-10-CM | POA: Diagnosis not present

## 2019-02-19 DIAGNOSIS — R5383 Other fatigue: Secondary | ICD-10-CM | POA: Diagnosis not present

## 2019-02-26 DIAGNOSIS — J209 Acute bronchitis, unspecified: Secondary | ICD-10-CM | POA: Diagnosis not present

## 2019-03-07 DIAGNOSIS — L4050 Arthropathic psoriasis, unspecified: Secondary | ICD-10-CM | POA: Diagnosis not present

## 2019-03-07 DIAGNOSIS — M4690 Unspecified inflammatory spondylopathy, site unspecified: Secondary | ICD-10-CM | POA: Diagnosis not present

## 2019-03-07 DIAGNOSIS — L409 Psoriasis, unspecified: Secondary | ICD-10-CM | POA: Diagnosis not present

## 2019-03-12 DIAGNOSIS — M7501 Adhesive capsulitis of right shoulder: Secondary | ICD-10-CM | POA: Diagnosis not present

## 2019-03-12 DIAGNOSIS — M25511 Pain in right shoulder: Secondary | ICD-10-CM | POA: Diagnosis not present

## 2019-04-09 IMAGING — US US EXTREM LOW VENOUS*R*
1 series · 13 of 24 positions shown · non-contrast
Comparison: No recent studies in PACs

CLINICAL DATA: Right lower extremity pain and swelling for the past
3 days



[Series 1: us extrem low venous*right* · 0.08mm/px · 13 of 35 slices shown]
[im 1/35]
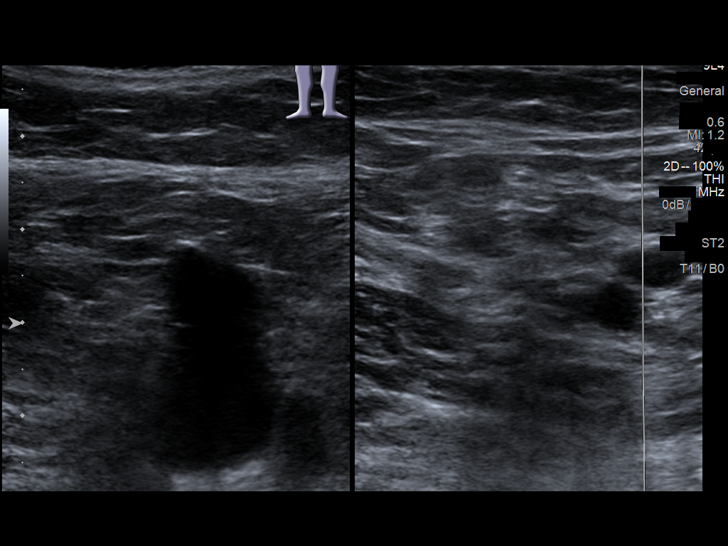
[im 3/35]
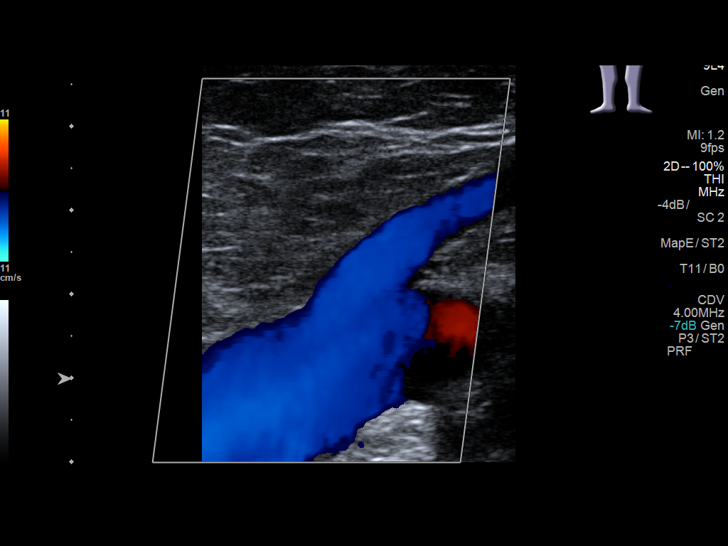
[im 6/35]
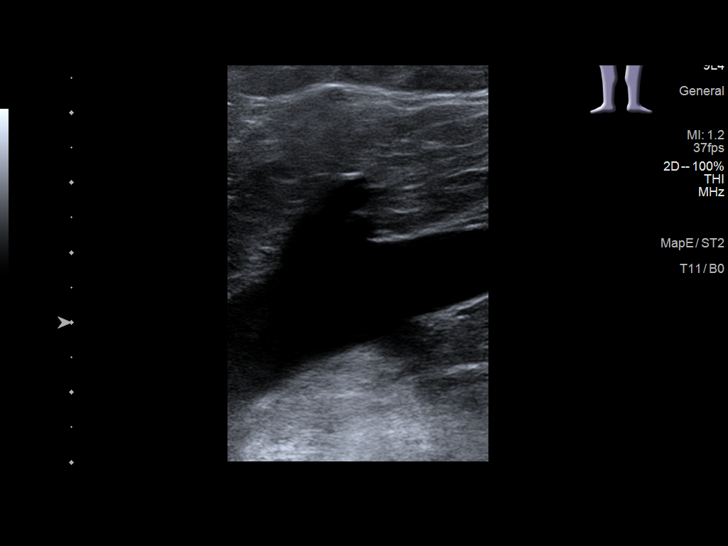
[im 9/35]
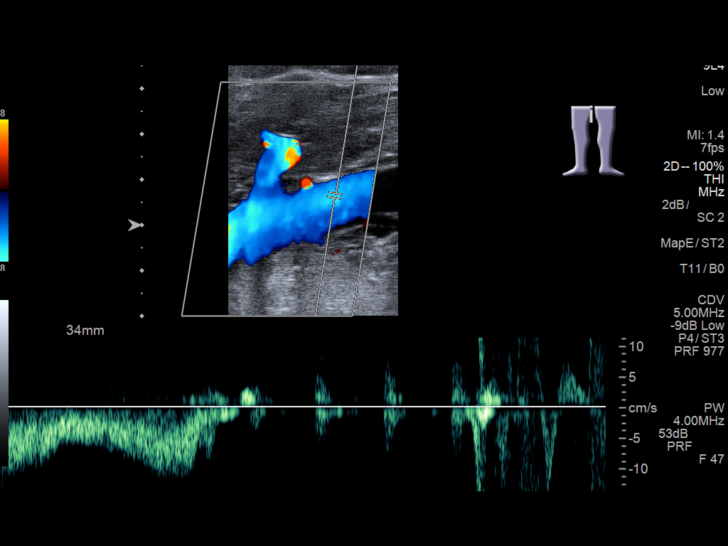
[im 12/35]
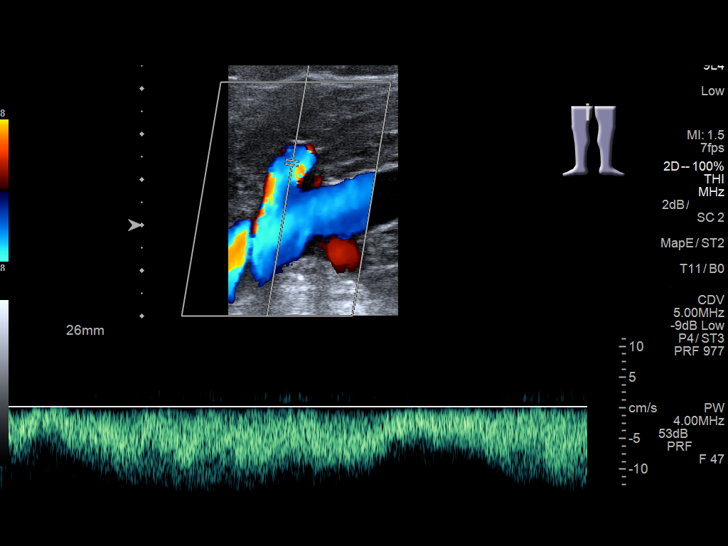
[im 15/35]
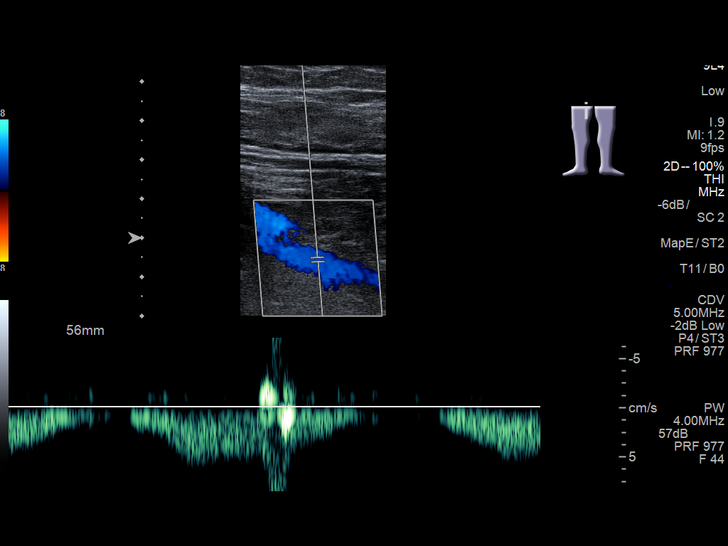
[im 18/35]
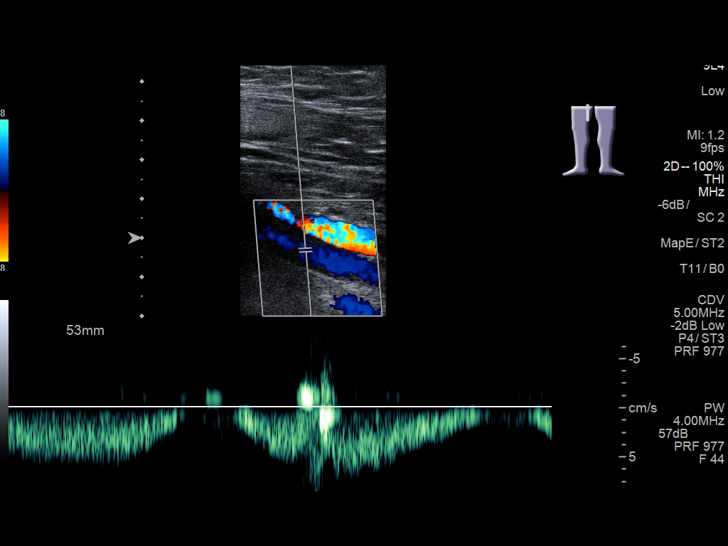
[im 20/35]
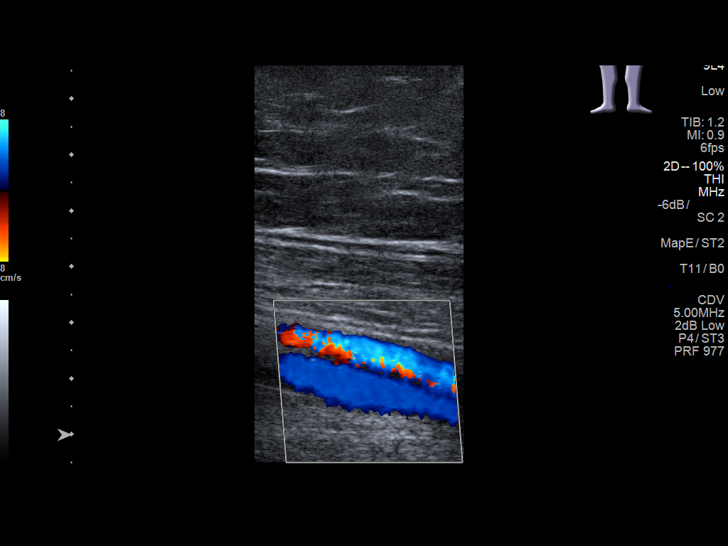
[im 23/35]
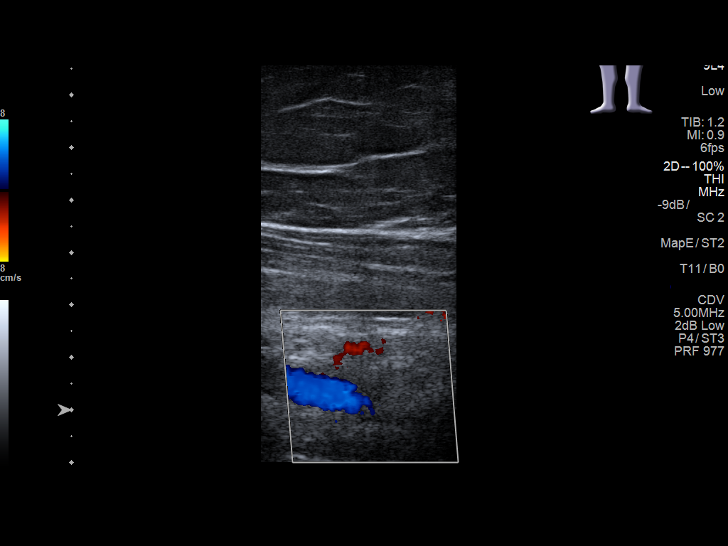
[im 26/35]
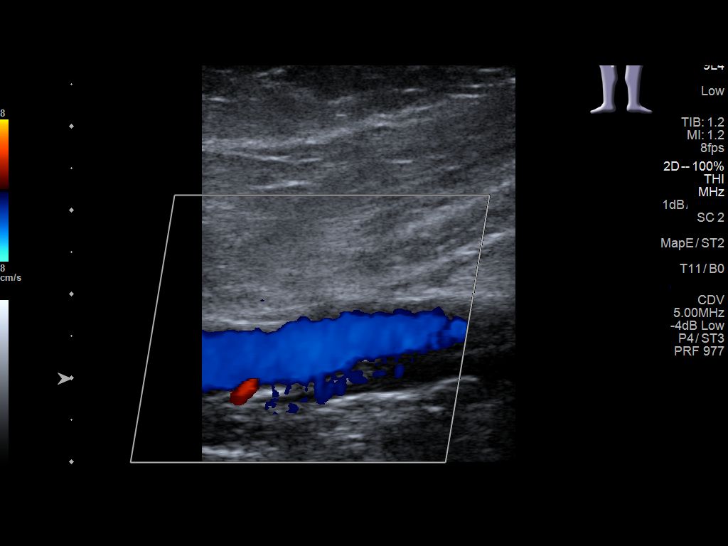
[im 29/35]
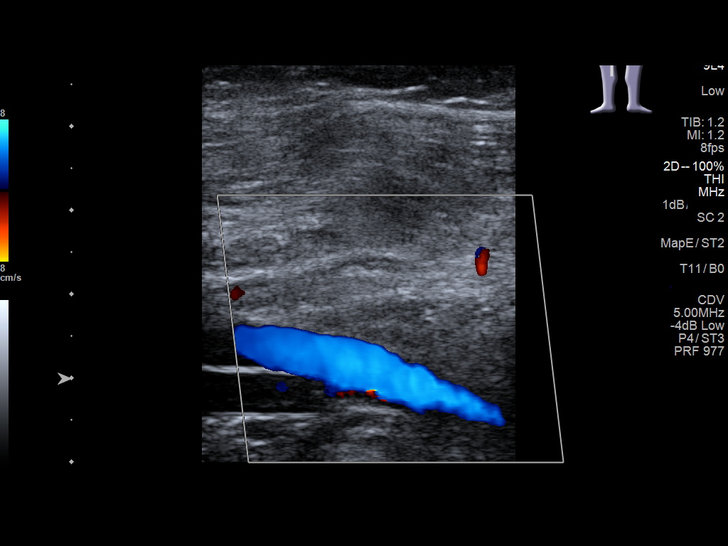
[im 32/35]
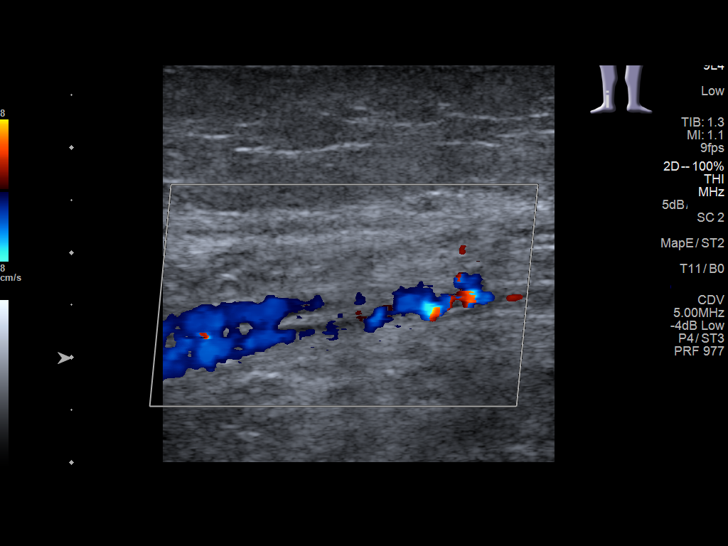
[im 35/35]
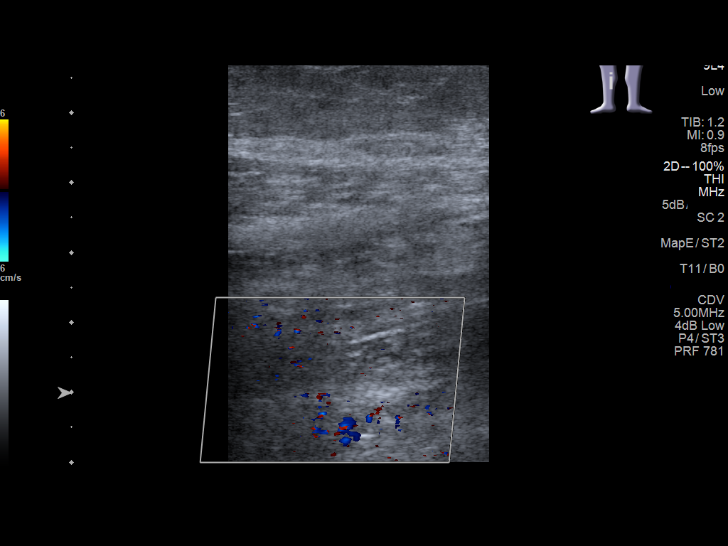

[13 of 24 positions shown; findings below may reference images not displayed]

FINDINGS: Contralateral Common Femoral Vein: Respiratory phasicity is normal
and symmetric with the symptomatic side. No evidence of thrombus.
Normal compressibility.

Common Femoral Vein: No evidence of thrombus. Normal
compressibility, respiratory phasicity and response to augmentation.

Saphenofemoral Junction: No evidence of thrombus. Normal
compressibility and flow on color Doppler imaging.

Profunda Femoral Vein: No evidence of thrombus. Normal
compressibility and flow on color Doppler imaging.

Femoral Vein: No evidence of thrombus. Normal compressibility,
respiratory phasicity and response to augmentation.

Popliteal Vein: No evidence of thrombus. Normal compressibility,
respiratory phasicity and response to augmentation.

Calf Veins: No evidence of thrombus. Normal compressibility and flow
on color Doppler imaging.

Superficial Great Saphenous Vein: No evidence of thrombus. Normal
compressibility and flow on color Doppler imaging.

Venous Reflux:  None.

Other Findings:  None.
IMPRESSION: No evidence of DVT within the right lower extremity.

## 2019-04-12 MED ORDER — ALBUTEROL SULFATE HFA 108 (90 BASE) MCG/ACT IN AERS: 2.0000 | INHALATION_SPRAY | Freq: Four times a day (QID) | RESPIRATORY_TRACT | 2 refills | Status: AC | PRN

## 2019-04-25 ENCOUNTER — Ambulatory Visit (INDEPENDENT_AMBULATORY_CARE_PROVIDER_SITE_OTHER): Payer: Self-pay | Admitting: Internal Medicine

## 2019-04-25 DIAGNOSIS — E669 Obesity, unspecified: Secondary | ICD-10-CM | POA: Diagnosis not present

## 2019-04-25 DIAGNOSIS — R5383 Other fatigue: Secondary | ICD-10-CM | POA: Diagnosis not present

## 2019-06-22 ENCOUNTER — Other Ambulatory Visit: Payer: Self-pay | Admitting: Cardiovascular Disease

## 2019-09-19 ENCOUNTER — Other Ambulatory Visit: Payer: Self-pay

## 2019-09-19 ENCOUNTER — Encounter: Payer: Self-pay | Admitting: Cardiovascular Disease

## 2019-09-19 ENCOUNTER — Ambulatory Visit (INDEPENDENT_AMBULATORY_CARE_PROVIDER_SITE_OTHER): Payer: 59 | Admitting: Cardiovascular Disease

## 2019-09-19 VITALS — BP 118/80 | HR 84 | Temp 97.3°F | Ht 68.0 in | Wt 251.0 lb

## 2019-09-19 DIAGNOSIS — I259 Chronic ischemic heart disease, unspecified: Secondary | ICD-10-CM | POA: Diagnosis not present

## 2019-09-19 DIAGNOSIS — Z008 Encounter for other general examination: Secondary | ICD-10-CM | POA: Diagnosis not present

## 2019-09-19 NOTE — Patient Instructions (Signed)
Medication Instructions:  Your physician recommends that you continue on your current medications as directed. Please refer to the Current Medication list given to you today.  If you need a refill on your cardiac medications before your next appointment, please call your pharmacy.   Lab work: none If you have labs (blood work) drawn today and your tests are completely normal, you will receive your results only by: . MyChart Message (if you have MyChart) OR . A paper copy in the mail If you have any lab test that is abnormal or we need to change your treatment, we will call you to review the results.  Testing/Procedures: none  Follow-Up: At [facility], you and your health needs are our priority.  As part of our continuing mission to provide you with exceptional heart care, we have created designated Provider Care Teams.  These Care Teams include your primary Cardiologist (physician) and Advanced Practice Providers (APPs -  Physician Assistants and Nurse Practitioners) who all work together to provide you with the care you need, when you need it. . You will need a follow up appointment as needed. You may see Dr. Berry or one of the following Advanced Practice Providers on your designated Care Team:   . Luke Kilroy, PA-C . Krista Kroeger, PA-C . Callie Goodrich, PA-C . Hao Meng, PA-C . Angela Duke, PA-C . Kathryn Lawrence, DNP . Rhonda Barrett, PA-C    

## 2019-09-19 NOTE — Progress Notes (Signed)
09/19/2019 Crystal Houston   1971/11/07  [id number]  Primary Physician Wilson Singer, MD Primary Cardiologist: Runell Gess MD FACP, [place], [place], [place]  HPI:  Crystal Houston is a 48 y.o.  moderately overweight engaged Caucasian female mother of 2 children who I last saw in the office 07/31/2018.She was referred by the emergency room for new onset chest pain. She has no cardiac risk factors. She does have obstructive sleep apnea. 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 She is never had a heart attack or stroke. She began having chest pain a week ago. It occurred on a daily basis and radiates to her jaw. She was evaluated in the emergency room on 06/11/2018 with negative enzymes and a normal EKG.  Because her symptoms are somewhat concerning with jaw pain as well I elected to perform outpatient radial diagnostic coronary angiography 06/18/2018 which was entirely normal.  She has continued to have occasional episodes of nitrate responsive chest pain however  Since I saw her a year ago she is done well.  She has not required any additional nitroglycerin.  She does have sleep apnea.  Her lipid profile is borderline with a total cholesterol 202 and LDL of 108.   Current Meds  Medication Sig  . albuterol (VENTOLIN HFA) 108 (90 Base) MCG/ACT inhaler Inhale 2 puffs into the lungs every 6 (six) hours as needed for wheezing or shortness of breath.  Mack Guise THYROID 90 MG tablet Take 90 mg by mouth daily.   Marland Kitchen BIOTIN PO Take 1 tablet by mouth daily.  . Cholecalciferol (VITAMIN D3) 5000 units CAPS Take 5,000 Units by mouth daily.  . clonazePAM (KLONOPIN) 0.5 MG tablet Take 1 tablet (0.5 mg total) by mouth 3 (three) times daily as needed for anxiety.  . COSENTYX SENSOREADY 300 DOSE 150 MG/ML SOAJ Inject 300 mg into the skin every 28 (twenty-eight) days.   . fluticasone (FLOVENT HFA) 220 MCG/ACT inhaler Inhale 2 puffs into the lungs 2 (two) times daily. Rinse mouth  after use.  . ibuprofen (ADVIL,MOTRIN) 200 MG tablet Take 400 mg by mouth every 6 (six) hours as needed for headache or moderate pain.   . isosorbide mononitrate (IMDUR) 30 MG 24 hr tablet TAKE 1/2 TABLET BY MOUTH DAILY  . loratadine (CLARITIN) 10 MG tablet Take 10 mg by mouth daily as needed for allergies.  . Misc Natural Products (OSTEO BI-FLEX ADV JOINT SHIELD PO) Take 1 tablet by mouth daily.  . naproxen (NAPROSYN) 500 MG tablet Take 500 mg by mouth 2 (two) times daily.  . nitroGLYCERIN (NITROSTAT) 0.4 MG SL tablet Place 1 tablet (0.4 mg total) under the tongue every 5 (five) minutes as needed for chest pain.  . NON FORMULARY Apply 0.2 mg topically daily. Testosterone 0.2 mg Cream  . TURMERIC PO Take 1 capsule by mouth daily.  Marland Kitchen venlafaxine XR (EFFEXOR-XR) 37.5 MG 24 hr capsule TAKE 1 CAPSULE BY MOUTH  DAILY WITH BREAKFAST     No Known Allergies  Social History   Socioeconomic History  . Marital status: Married    Spouse name: Not on file  . Number of children: 3  . Years of education: Not on file  . Highest education level: Not on file  Occupational History  . Occupation: Building control surveyor: [employer]  Social Needs  . Financial resource strain: Not on file  . Food insecurity    Worry: Not on file    Inability:  Not on file  . Transportation needs    Medical: Not on file    Non-medical: Not on file  Tobacco Use  . Smoking status: Never Smoker  . Smokeless tobacco: Never Used  Substance and Sexual Activity  . Alcohol use: Yes    Alcohol/week: 2.0 standard drinks    Types: 2 Glasses of wine per week    Comment: Very rare  . Drug use: No  . Sexual activity: Never    Birth control/protection: I.U.D.  Lifestyle  . Physical activity    Days per week: Not on file    Minutes per session: Not on file  . Stress: Not on file  Relationships  . Social Herbalist on phone: Not on file    Gets together: Not on file    Attends religious service: Not on file     Active member of club or organization: Not on file    Attends meetings of clubs or organizations: Not on file    Relationship status: Not on file  . Intimate partner violence    Fear of current or ex partner: Not on file    Emotionally abused: Not on file    Physically abused: Not on file    Forced sexual activity: Not on file  Other Topics Concern  . Not on file  Social History Narrative   Daughter died in 2013/04/17 of osteosarcoma     Review of Systems: General: negative for chills, fever, night sweats or weight changes.  Cardiovascular: negative for chest pain, dyspnea on exertion, edema, orthopnea, palpitations, paroxysmal nocturnal dyspnea or shortness of breath Dermatological: negative for rash Respiratory: negative for cough or wheezing Urologic: negative for hematuria Abdominal: negative for nausea, vomiting, diarrhea, bright red blood per rectum, melena, or hematemesis Neurologic: negative for visual changes, syncope, or dizziness All other systems reviewed and are otherwise negative except as noted above.    Blood pressure 118/80, pulse 84, temperature (!) 97.3 F (36.3 C), temperature source Temporal, height 5\' 8"  (1.727 m), weight 251 lb (113.9 kg), SpO2 97 %.  General appearance: alert and no distress Neck: no adenopathy, no carotid bruit, no JVD, supple, symmetrical, trachea midline and thyroid not enlarged, symmetric, no tenderness/mass/nodules Lungs: clear to auscultation bilaterally Heart: regular rate and rhythm, S1, S2 normal, no murmur, click, rub or gallop Extremities: extremities normal, atraumatic, no cyanosis or edema Pulses: 2+ and symmetric Skin: Skin color, texture, turgor normal. No rashes or lesions Neurologic: Alert and oriented X 3, normal strength and tone. Normal symmetric reflexes. Normal coordination and gait  EKG sinus rhythm at 84 with septal Q waves.  Personally reviewed this EKG.  ASSESSMENT AND PLAN:   Chest pain Ms. Perren had atypical  chest pain with cardiac catheterization performed by myself via the right radial approach 06/18/2018 that was entirely normal.  It was presumed that she had coronary vasospasm when she was put on a long-acting oral nitrate and has had no chest pain since.      Lorretta Harp MD FACP,FACC,FAHA, [facility] 09/19/2019 4:53 PM

## 2019-09-19 NOTE — Assessment & Plan Note (Signed)
Crystal Houston had atypical chest pain with cardiac catheterization performed by myself via the right radial approach 06/18/2018 that was entirely normal.  It was presumed that she had coronary vasospasm when she was put on a long-acting oral nitrate and has had no chest pain since.

## 2019-12-19 ENCOUNTER — Other Ambulatory Visit (INDEPENDENT_AMBULATORY_CARE_PROVIDER_SITE_OTHER): Payer: Self-pay | Admitting: Internal Medicine

## 2020-02-07 ENCOUNTER — Ambulatory Visit: Payer: 59 | Attending: Internal Medicine

## 2020-02-07 DIAGNOSIS — Z23 Encounter for immunization: Secondary | ICD-10-CM

## 2020-02-07 NOTE — Progress Notes (Signed)
   Covid-19 Vaccination Clinic  Name:  Crystal Houston    MRN: [id number] DOB: 03-Apr-1971  02/07/2020  [name] was observed post Covid-19 immunization for 15 minutes without incident. She was provided with Vaccine Information Sheet and instruction to access the V-Safe system.   [name] was instructed to call 911 with any severe reactions post vaccine: Marland Kitchen Difficulty breathing  . Swelling of face and throat  . A fast heartbeat  . A bad rash all over body  . Dizziness and weakness   Immunizations Administered    Name Date Dose VIS Date Route   Pfizer COVID-19 Vaccine 02/07/2020 11:37 AM 0.3 mL 11/15/2019 Intramuscular   Manufacturer: [employer], [employer]   Lot: [id number]   NDC: [address]-3

## 2020-02-28 ENCOUNTER — Ambulatory Visit: Payer: 59 | Attending: Internal Medicine

## 2020-02-28 DIAGNOSIS — Z23 Encounter for immunization: Secondary | ICD-10-CM

## 2020-02-28 NOTE — Progress Notes (Signed)
   Covid-19 Vaccination Clinic  Name:  Crystal Houston    MRN: [id number] DOB: [birth year]  02/28/2020  Crystal Houston was observed post Covid-19 immunization for 15 minutes without incident. She was provided with Vaccine Information Sheet and instruction to access the V-Safe system.   Crystal Houston was instructed to call 911 with any severe reactions post vaccine: Marland Kitchen Difficulty breathing  . Swelling of face and throat  . A fast heartbeat  . A bad rash all over body  . Dizziness and weakness   Immunizations Administered    Name Date Dose VIS Date Route   Pfizer COVID-19 Vaccine 02/28/2020 10:48 AM 0.3 mL 11/15/2019 Intramuscular   Manufacturer: [employer], [employer]   Lot: [id number]   NDC: [address]-1

## 2020-07-28 ENCOUNTER — Encounter (INDEPENDENT_AMBULATORY_CARE_PROVIDER_SITE_OTHER): Payer: Self-pay | Admitting: Internal Medicine

## 2020-07-28 ENCOUNTER — Other Ambulatory Visit: Payer: Self-pay

## 2020-07-28 ENCOUNTER — Ambulatory Visit (INDEPENDENT_AMBULATORY_CARE_PROVIDER_SITE_OTHER): Payer: 59 | Admitting: Internal Medicine

## 2020-07-28 VITALS — BP 122/80 | HR 89 | Temp 97.5°F | Resp 18 | Ht 68.0 in | Wt 249.8 lb

## 2020-07-28 DIAGNOSIS — E039 Hypothyroidism, unspecified: Secondary | ICD-10-CM | POA: Diagnosis not present

## 2020-07-28 DIAGNOSIS — F520 Hypoactive sexual desire disorder: Secondary | ICD-10-CM

## 2020-07-28 DIAGNOSIS — E6609 Other obesity due to excess calories: Secondary | ICD-10-CM

## 2020-07-28 DIAGNOSIS — Z6837 Body mass index (BMI) 37.0-37.9, adult: Secondary | ICD-10-CM | POA: Diagnosis not present

## 2020-07-28 MED ORDER — NONFORMULARY OR COMPOUNDED ITEM: 5.0000 mg | 3 refills | Status: DC

## 2020-07-28 MED ORDER — ARMOUR THYROID 90 MG PO TABS: 90.0000 mg | ORAL_TABLET | Freq: Every day | ORAL | 0 refills | Status: DC

## 2020-07-28 NOTE — Progress Notes (Signed)
Metrics: Intervention Frequency ACO  Documented Smoking Status Yearly  Screened one or more times in 24 months  Cessation Counseling or  Active cessation medication Past 24 months  Past 24 months   Guideline developer: UpToDate (See UpToDate for funding source) Date Released: 2013-04-08       Wellness Office Visit  Subjective:  Patient ID: Crystal Houston, female    DOB: 07-21-1971  Age: 49 y.o. MRN: [id number]  CC: This lady comes back to our office after a very long hiatus.  She has had family deaths and now is looking after her 59 year old grandmother. HPI  She has a history of hypothyroidism, obesity, sleep apnea. She also had testosterone deficiency and was taking testosterone therapy. She would like to get back onto medications again. Past Medical History:  Diagnosis Date  . Anxiety   . Depression   . OSA on CPAP 08/10/2017  . Psoriatic arthritis [facility])    Past Surgical History:  Procedure Laterality Date  . FRACTURE SURGERY    . I & D EXTREMITY  11/10/2011   Procedure: IRRIGATION AND DEBRIDEMENT EXTREMITY;  Surgeon: Loanne Drilling;  Location: [facility] OR;  Service: Orthopedics;  Laterality: Right;  . LEFT HEART CATH AND CORONARY ANGIOGRAPHY N/A 06/18/2018   Procedure: LEFT HEART CATH AND CORONARY ANGIOGRAPHY;  Surgeon: Runell Gess, MD;  Location: [facility] INVASIVE CV LAB;  Service: Cardiovascular;  Laterality: N/A;  . ORIF ANKLE FRACTURE  11/10/2011   Procedure: OPEN REDUCTION INTERNAL FIXATION (ORIF) ANKLE FRACTURE;  Surgeon: Loanne Drilling;  Location: [facility] OR;  Service: Orthopedics;  Laterality: Right;  with screw applicaton     Family History  Problem Relation Age of Onset  . Cancer Daughter        osteosarcoma of leg  . Cancer Paternal Grandfather        colon    Social History   Social History Narrative   Daughter died in 2013/04/08 of osteosarcoma.Lives with fiancee and kids.Works for [employer] from home.   Social History   Tobacco Use  . Smoking status: Never Smoker  .  Smokeless tobacco: Never Used  Substance Use Topics  . Alcohol use: Yes    Alcohol/week: 2.0 standard drinks    Types: 2 Glasses of wine per week    Comment: Very rare    Current Meds  Medication Sig  . albuterol (VENTOLIN HFA) 108 (90 Base) MCG/ACT inhaler Inhale 2 puffs into the lungs every 6 (six) hours as needed for wheezing or shortness of breath.  Marland Kitchen BIOTIN PO Take 1 tablet by mouth daily.  . Cholecalciferol (VITAMIN D3) 5000 units CAPS Take 5,000 Units by mouth daily.  . COSENTYX SENSOREADY 300 DOSE 150 MG/ML SOAJ Inject 300 mg into the skin every 28 (twenty-eight) days.   Marland Kitchen ibuprofen (ADVIL,MOTRIN) 200 MG tablet Take 400 mg by mouth every 6 (six) hours as needed for headache or moderate pain.   . isosorbide mononitrate (IMDUR) 30 MG 24 hr tablet TAKE 1/2 TABLET BY MOUTH DAILY  . loratadine (CLARITIN) 10 MG tablet Take 10 mg by mouth daily as needed for allergies.  . Misc Natural Products (OSTEO BI-FLEX ADV JOINT SHIELD PO) Take 1 tablet by mouth daily.  . naproxen (NAPROSYN) 500 MG tablet Take 500 mg by mouth 2 (two) times daily.  . NON FORMULARY Apply 0.2 mg topically daily. Testosterone 0.2 mg Cream  . TURMERIC PO Take 1 capsule by mouth daily.       Depression screen [facility] 2/9 09/12/2017 05/11/2017  05/03/2016  Decreased Interest 2 2 0  Down, Depressed, Hopeless 2 1 0  PHQ - 2 Score 4 3 0  Altered sleeping 3 2 -  Tired, decreased energy 3 3 -  Change in appetite 2 2 -  Feeling bad or failure about yourself  3 2 -  Trouble concentrating 3 0 -  Moving slowly or fidgety/restless 1 1 -  Suicidal thoughts 1 0 -  PHQ-9 Score 20 13 -  Difficult doing work/chores Very difficult - -     Objective:   Today's Vitals: BP 122/80 (BP Location: Left Arm, Patient Position: Sitting, Cuff Size: Normal)   Pulse 89   Temp (!) 97.5 F (36.4 C) (Temporal)   Resp 18   Ht 5\' 8"  (1.727 m)   Wt 249 lb 12.8 oz (113.3 kg)   SpO2 98%   BMI 37.98 kg/m  Vitals with BMI 07/28/2020 09/19/2019  09/17/2018  Height 5\' 8"  5\' 8"  5\' 8"   Weight 249 lbs 13 oz 251 lbs 263 lbs  BMI 37.99 38.17 40  Systolic 122 118 09/19/2018  Diastolic 80 80 72  Pulse 89 84 83     Physical Exam  She remains obese.  Blood pressure is in a good range.     Assessment   1. Class 2 obesity due to excess calories without serious comorbidity with body mass index (BMI) of 37.0 to 37.9 in adult   2. Hypothyroidism, adult   3. Hypoactive sexual desire disorder       Tests ordered No orders of the defined types were placed in this encounter.    Plan: 1. I have refilled her Armour Thyroid that she was previously taking. [facility] am [facility] and she will start taking testosterone injections 5 mg twice a week that she was doing before for her hypoactive sexual desire disorder. 3.  I will see her in a couple of months and we will check all the blood work then.  Meds ordered this encounter  Medications  . ARMOUR THYROID 90 MG tablet    Sig: Take 1 tablet (90 mg total) by mouth daily.    Dispense:  90 tablet    Refill:  0  . NONFORMULARY OR COMPOUNDED ITEM    Sig: Inject 5 mg into the muscle 2 (two) times a week. Testosterone cypionate in olive  oil (25 mg/mL).  Dispense 5 mL vial.    Dispense:  1 each    Refill:  3    Hymen Arnett , MD

## 2020-07-30 ENCOUNTER — Telehealth (INDEPENDENT_AMBULATORY_CARE_PROVIDER_SITE_OTHER): Payer: Self-pay

## 2020-07-30 ENCOUNTER — Other Ambulatory Visit (INDEPENDENT_AMBULATORY_CARE_PROVIDER_SITE_OTHER): Payer: Self-pay | Admitting: Internal Medicine

## 2020-07-30 MED ORDER — ARMOUR THYROID 90 MG PO TABS: 90.0000 mg | ORAL_TABLET | Freq: Every day | ORAL | 1 refills | Status: DC

## 2020-07-30 NOTE — Telephone Encounter (Signed)
I have sent a 90-day supply to her mail order pharmacy.

## 2020-07-31 ENCOUNTER — Encounter (INDEPENDENT_AMBULATORY_CARE_PROVIDER_SITE_OTHER): Payer: Self-pay | Admitting: Internal Medicine

## 2020-08-03 NOTE — Telephone Encounter (Signed)
Yes working on this now.

## 2020-09-29 ENCOUNTER — Ambulatory Visit (INDEPENDENT_AMBULATORY_CARE_PROVIDER_SITE_OTHER): Payer: 59 | Admitting: Internal Medicine

## 2020-10-27 ENCOUNTER — Ambulatory Visit (INDEPENDENT_AMBULATORY_CARE_PROVIDER_SITE_OTHER): Payer: 59 | Admitting: Internal Medicine

## 2020-10-27 ENCOUNTER — Encounter (INDEPENDENT_AMBULATORY_CARE_PROVIDER_SITE_OTHER): Payer: Self-pay | Admitting: Internal Medicine

## 2020-10-27 ENCOUNTER — Other Ambulatory Visit: Payer: Self-pay

## 2020-10-27 VITALS — BP 110/76 | HR 94 | Temp 97.5°F | Ht 68.0 in | Wt 246.2 lb

## 2020-10-27 DIAGNOSIS — E039 Hypothyroidism, unspecified: Secondary | ICD-10-CM

## 2020-10-27 DIAGNOSIS — F520 Hypoactive sexual desire disorder: Secondary | ICD-10-CM

## 2020-10-27 DIAGNOSIS — Z6837 Body mass index (BMI) 37.0-37.9, adult: Secondary | ICD-10-CM

## 2020-10-27 DIAGNOSIS — E559 Vitamin D deficiency, unspecified: Secondary | ICD-10-CM

## 2020-10-27 DIAGNOSIS — E6609 Other obesity due to excess calories: Secondary | ICD-10-CM

## 2020-10-27 MED ORDER — NP THYROID 120 MG PO TABS: 120.0000 mg | ORAL_TABLET | Freq: Every day | ORAL | 3 refills | Status: DC

## 2020-10-27 NOTE — Progress Notes (Signed)
Metrics: Intervention Frequency ACO  Documented Smoking Status Yearly  Screened one or more times in 24 months  Cessation Counseling or  Active cessation medication Past 24 months  Past 24 months   Guideline developer: UpToDate (See UpToDate for funding source) Date Released: 12-Apr-2013       Wellness Office Visit  Subjective:  Patient ID: Crystal Houston, female    DOB: 1971/04/17  Age: 49 y.o. MRN: [id number]  CC: This lady comes in for follow-up of hypothyroidism, hypoactive sexual desire disorder, obesity and vitamin D deficiency. HPI  She has tolerated the dose of testosterone and Armour Thyroid that I prescribed but previously she was on higher doses and although she has felt she feels more energized with increased libido as well, she realizes that she needs to be on the higher dose that she was on previously. She continues on vitamin D3 5000 units daily. 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 Past Medical History:  Diagnosis Date  . Anxiety   . Depression   . OSA on CPAP 08/10/2017  . Psoriatic arthritis [facility])    Past Surgical History:  Procedure Laterality Date  . FRACTURE SURGERY    . I & D EXTREMITY  11/10/2011   Procedure: IRRIGATION AND DEBRIDEMENT EXTREMITY;  Surgeon: Loanne Drilling;  Location: [facility] OR;  Service: Orthopedics;  Laterality: Right;  . LEFT HEART CATH AND CORONARY ANGIOGRAPHY N/A 06/18/2018   Procedure: LEFT HEART CATH AND CORONARY ANGIOGRAPHY;  Surgeon: Runell Gess, MD;  Location: [facility] INVASIVE CV LAB;  Service: Cardiovascular;  Laterality: N/A;  . ORIF ANKLE FRACTURE  11/10/2011   Procedure: OPEN REDUCTION INTERNAL FIXATION (ORIF) ANKLE FRACTURE;  Surgeon: Loanne Drilling;  Location: [facility] OR;  Service: Orthopedics;  Laterality: Right;  with screw applicaton     Family History  Problem Relation Age of Onset  . Cancer Daughter        osteosarcoma of leg  . Cancer Paternal Grandfather        colon     Social History   Social History Narrative   Daughter died in 12-Apr-2013 of osteosarcoma.Lives with fiancee and kids.Works for [employer] from home.   Social History   Tobacco Use  . Smoking status: Never Smoker  . Smokeless tobacco: Never Used  Substance Use Topics  . Alcohol use: Yes    Alcohol/week: 2.0 standard drinks    Types: 2 Glasses of wine per week    Comment: Very rare    Current Meds  Medication Sig  . albuterol (VENTOLIN HFA) 108 (90 Base) MCG/ACT inhaler Inhale 2 puffs into the lungs every 6 (six) hours as needed for wheezing or shortness of breath.  . B-D TB SYRINGE 1CC/25GX5/8" 25G X 5/8" 1 ML MISC Inject into the skin once a week.  Marland Kitchen BIOTIN PO Take 1 tablet by mouth daily.  Marland Kitchen CAREPOINT SAFETY1ST SYR/NEEDLE 23G X 1" 1 ML MISC USE AS DIRECTED.  Marland Kitchen Cholecalciferol (VITAMIN D3) 5000 units CAPS Take 5,000 Units by mouth daily.  . clonazePAM (KLONOPIN) 0.5 MG tablet Take 1 tablet (0.5 mg total) by mouth 3 (three) times daily as needed for anxiety.  . COSENTYX SENSOREADY 300 DOSE 150 MG/ML SOAJ Inject 300 mg into the skin every 28 (twenty-eight) days.   . folic acid (FOLVITE) 1 MG tablet Take 1 mg by mouth daily.  Marland Kitchen ibuprofen (ADVIL,MOTRIN) 200 MG tablet Take 400 mg by mouth every 6 (six) hours as needed for  headache or moderate pain.   . isosorbide mononitrate (IMDUR) 30 MG 24 hr tablet TAKE 1/2 TABLET BY MOUTH DAILY  . loratadine (CLARITIN) 10 MG tablet Take 10 mg by mouth daily as needed for allergies.  . methotrexate 50 MG/2ML injection SMARTSIG:0.6 Milliliter(s) IM Once a Week  . metroNIDAZOLE (METROGEL) 1 % gel Apply topically 2 (two) times daily.  . Misc Natural Products (OSTEO BI-FLEX ADV JOINT SHIELD PO) Take 1 tablet by mouth daily.  . naproxen (NAPROSYN) 500 MG tablet Take 500 mg by mouth 2 (two) times daily.  . NONFORMULARY OR COMPOUNDED ITEM Inject 5 mg into the muscle 2 (two) times a week. Testosterone cypionate in olive  oil (25 mg/mL).  Dispense 5 mL vial.  .  traMADol (ULTRAM) 50 MG tablet Take 50 mg by mouth 3 (three) times daily as needed.  . TURMERIC PO Take 1 capsule by mouth daily.  Marland Kitchen venlafaxine XR (EFFEXOR-XR) 37.5 MG 24 hr capsule TAKE 1 CAPSULE BY MOUTH  DAILY WITH BREAKFAST  . [DISCONTINUED] ARMOUR THYROID 90 MG tablet Take 1 tablet (90 mg total) by mouth daily.  . [DISCONTINUED] NON FORMULARY Apply 0.2 mg topically daily. Testosterone 0.2 mg Cream      Depression screen [facility] 2/9 09/12/2017 05/11/2017 05/03/2016  Decreased Interest 2 2 0  Down, Depressed, Hopeless 2 1 0  PHQ - 2 Score 4 3 0  Altered sleeping 3 2 -  Tired, decreased energy 3 3 -  Change in appetite 2 2 -  Feeling bad or failure about yourself  3 2 -  Trouble concentrating 3 0 -  Moving slowly or fidgety/restless 1 1 -  Suicidal thoughts 1 0 -  PHQ-9 Score 20 13 -  Difficult doing work/chores Very difficult - -     Objective:   Today's Vitals: BP 110/76   Pulse 94   Temp (!) 97.5 F (36.4 C) (Temporal)   Ht 5\' 8"  (1.727 m)   Wt 246 lb 3.2 oz (111.7 kg)   SpO2 98%   BMI 37.43 kg/m  Vitals with BMI 10/27/2020 07/28/2020 09/19/2019  Height 5\' 8"  5\' 8"  5\' 8"   Weight 246 lbs 3 oz 249 lbs 13 oz 251 lbs  BMI 37.44 37.99 38.17  Systolic 110 122 09/21/2019  Diastolic 76 80 80  Pulse 94 89 84     Physical Exam  Although she remains obese, she has lost 3 pounds in weight since last time I saw her.  Blood pressure is excellent.     Assessment   1. Hypothyroidism, adult   2. Hypoactive sexual desire disorder   3. Class 2 obesity due to excess calories without serious comorbidity with body mass index (BMI) of 37.0 to 37.9 in adult   4. Vitamin D deficiency disease       Tests ordered No orders of the defined types were placed in this encounter.    Plan: 1. I agree that we can increase desiccated thyroid so I have sent a new prescription for NP thyroid 120 mg daily.  We changed it from Armour Thyroid to NP thyroid because insurance will pay for the NP  thyroid. 2. She will go ahead and increase testosterone so that she is taking 0.4 mL twice a week. 3. I have told her to increase the vitamin D3 to 10,000 units daily. 4. She will continue to focus on nutrition. 5. I will see her in a couple of months time to see how she is doing and most likely  we will do blood work then.  Influenza vaccination was given today.   Meds ordered this encounter  Medications  . NP THYROID 120 MG tablet    Sig: Take 1 tablet (120 mg total) by mouth daily before breakfast.    Dispense:  30 tablet    Refill:  3    Vianny Schraeder Normajean Glasgow, MD

## 2021-01-13 ENCOUNTER — Encounter (INDEPENDENT_AMBULATORY_CARE_PROVIDER_SITE_OTHER): Payer: Self-pay | Admitting: Internal Medicine

## 2021-01-13 ENCOUNTER — Ambulatory Visit (INDEPENDENT_AMBULATORY_CARE_PROVIDER_SITE_OTHER): Payer: 59 | Admitting: Internal Medicine

## 2021-01-13 ENCOUNTER — Other Ambulatory Visit: Payer: Self-pay

## 2021-01-13 VITALS — BP 126/80 | HR 72 | Ht 68.0 in | Wt 242.6 lb

## 2021-01-13 DIAGNOSIS — E039 Hypothyroidism, unspecified: Secondary | ICD-10-CM | POA: Diagnosis not present

## 2021-01-13 DIAGNOSIS — E785 Hyperlipidemia, unspecified: Secondary | ICD-10-CM

## 2021-01-13 DIAGNOSIS — Z1211 Encounter for screening for malignant neoplasm of colon: Secondary | ICD-10-CM

## 2021-01-13 DIAGNOSIS — N951 Menopausal and female climacteric states: Secondary | ICD-10-CM | POA: Diagnosis not present

## 2021-01-13 DIAGNOSIS — F520 Hypoactive sexual desire disorder: Secondary | ICD-10-CM | POA: Diagnosis not present

## 2021-01-13 MED ORDER — NONFORMULARY OR COMPOUNDED ITEM: 10.0000 mg | 3 refills | Status: DC

## 2021-01-13 NOTE — Progress Notes (Signed)
Metrics: Intervention Frequency ACO  Documented Smoking Status Yearly  Screened one or more times in 24 months  Cessation Counseling or  Active cessation medication Past 24 months  Past 24 months   Guideline developer: UpToDate (See UpToDate for funding source) Date Released: 03-31-13       Wellness Office Visit  Subjective:  Patient ID: Crystal Houston, female    DOB: 1971-01-30  Age: 50 y.o. MRN: [id number]  CC: This lady comes in for follow-up of hypothyroidism, perimenopausal, dyslipidemia, obesity and decreased libido. HPI  She has tolerated higher dose of testosterone and she needs a refill of this. She is also tolerated NP thyroid 120 mg daily without any problems. She has increased vitamin D3 to 10,000 units daily. She continues to focus on nutrition and is trying to become more consistent. She says that her menstrual cycles are irregular.  She denies any significant hot flashes.  She wonders if she is in the menopause. Past Medical History:  Diagnosis Date  . Anxiety   . Depression   . OSA on CPAP 08/10/2017  . Psoriatic arthritis [facility])    Past Surgical History:  Procedure Laterality Date  . FRACTURE SURGERY    . I & D EXTREMITY  11/10/2011   Procedure: IRRIGATION AND DEBRIDEMENT EXTREMITY;  Surgeon: Loanne Drilling;  [facility];  Service: Orthopedics;  Laterality: Right;  . LEFT HEART CATH AND CORONARY ANGIOGRAPHY N/A 06/18/2018   Procedure: LEFT HEART CATH AND CORONARY ANGIOGRAPHY;  Surgeon: Runell Gess, MD;  [facility];  Service: Cardiovascular;  Laterality: N/A;  . ORIF ANKLE FRACTURE  11/10/2011   Procedure: OPEN REDUCTION INTERNAL FIXATION (ORIF) ANKLE FRACTURE;  Surgeon: Loanne Drilling;  [facility];  Service: Orthopedics;  Laterality: Right;  with screw applicaton     Family History  Problem Relation Age of Onset  . Cancer Daughter        osteosarcoma of leg  . Cancer Paternal Grandfather        colon    Social History    Social History Narrative   Daughter died in 2013/03/31 of osteosarcoma.Lives with fiancee and kids.Works for [employer] from home.   Social History   Tobacco Use  . Smoking status: Never Smoker  . Smokeless tobacco: Never Used  Substance Use Topics  . Alcohol use: Yes    Alcohol/week: 2.0 standard drinks    Types: 2 Glasses of wine per week    Comment: Very rare    Current Meds  Medication Sig  . albuterol (VENTOLIN HFA) 108 (90 Base) MCG/ACT inhaler Inhale 2 puffs into the lungs every 6 (six) hours as needed for wheezing or shortness of breath.  . B-D TB SYRINGE 1CC/25GX5/8" 25G X 5/8" 1 ML MISC Inject into the skin once a week.  Marland Kitchen BIOTIN PO Take 1 tablet by mouth daily.  Marland Kitchen CAREPOINT SAFETY1ST SYR/NEEDLE 23G X 1" 1 ML MISC USE AS DIRECTED.  Marland Kitchen Cholecalciferol (VITAMIN D3) 5000 units CAPS Take 10,000 Units by mouth daily.  . COSENTYX SENSOREADY 300 DOSE 150 MG/ML SOAJ Inject 300 mg into the skin every 28 (twenty-eight) days.   . folic acid (FOLVITE) 1 MG tablet Take 1 mg by mouth daily.  . isosorbide mononitrate (IMDUR) 30 MG 24 hr tablet TAKE 1/2 TABLET BY MOUTH DAILY  . methotrexate 50 MG/2ML injection SMARTSIG:0.6 Milliliter(s) IM Once a Week  . Misc Natural Products (OSTEO BI-FLEX ADV JOINT SHIELD PO) Take 1 tablet by mouth daily.  Marland Kitchen  naproxen (NAPROSYN) 500 MG tablet Take 500 mg by mouth 2 (two) times daily.  . NONFORMULARY OR COMPOUNDED ITEM Inject 5 mg into the muscle 2 (two) times a week. Testosterone cypionate in olive  oil (25 mg/mL).  Dispense 5 mL vial. (Patient taking differently: Inject 10 mg into the muscle 2 (two) times a week. Testosterone cypionate in olive  oil (25 mg/mL).  Dispense 5 mL vial.)  . NP THYROID 120 MG tablet Take 1 tablet (120 mg total) by mouth daily before breakfast.  . traMADol (ULTRAM) 50 MG tablet Take 50 mg by mouth 3 (three) times daily as needed.  . TURMERIC PO Take 1 capsule by mouth daily.     Flowsheet Row Office Visit from 09/12/2017 in [facility]  PHQ-9 Total Score 20      Objective:   Today's Vitals: BP 126/80   Pulse 72   Ht 5\' 8"  (1.727 m)   Wt 242 lb 9.6 oz (110 kg)   BMI 36.89 kg/m  Vitals with BMI 01/13/2021 10/27/2020 07/28/2020  Height 5\' 8"  5\' 8"  5\' 8"   Weight 242 lbs 10 oz 246 lbs 3 oz 249 lbs 13 oz  BMI 36.9 37.44 37.99  Systolic 126 110 07/30/2020  Diastolic 80 76 80  Pulse 72 94 89     Physical Exam  She looks systemically well.  She has lost 4 pounds since the last visit and remains obese.  Blood pressure acceptable.     Assessment   1. Hypoactive sexual desire disorder   2. Hypothyroidism, adult   3. Perimenopause   4. Dyslipidemia   5. Colon cancer screening       Tests ordered Orders Placed This Encounter  Procedures  . Estradiol  . Progesterone  . Testos,Total,Free and SHBG (Female)  . T3, free  . TSH  . Lipid panel  . Follicle stimulating hormone  . Ambulatory referral to Gastroenterology     Plan: 1. Blood work is ordered. 2. She will continue with testosterone therapy as before. 3. She will continue with NP thyroid and we will check thyroid function. 4. We may consider the use of progesterone as she is in the perimenopause.  I will see if the [facility] is in the menopausal range in which case she may actually benefit from estradiol also but I doubt that this will be the case. 5. We will refer her for screening colonoscopy.   No orders of the defined types were placed in this encounter.   , MD

## 2021-01-15 ENCOUNTER — Encounter: Payer: Self-pay | Admitting: Internal Medicine

## 2021-02-18 ENCOUNTER — Ambulatory Visit (INDEPENDENT_AMBULATORY_CARE_PROVIDER_SITE_OTHER): Payer: Self-pay | Admitting: *Deleted

## 2021-02-18 ENCOUNTER — Other Ambulatory Visit: Payer: Self-pay

## 2021-02-18 VITALS — Ht 68.0 in | Wt 244.0 lb

## 2021-02-18 DIAGNOSIS — Z1211 Encounter for screening for malignant neoplasm of colon: Secondary | ICD-10-CM

## 2021-02-18 NOTE — Progress Notes (Addendum)
Gastroenterology Pre-Procedure Review  Request Date: 02/18/2021 Requesting Physician: Dr. Karilyn Cota, no previous TCS, family hx of colon cancer (grandfather)  PATIENT REVIEW QUESTIONS: The patient responded to the following health history questions as indicated:    1. Diabetes Melitis: no 2. Joint replacements in the past 12 months: no 3. Major health problems in the past 3 months: no 4. Has an artificial valve or MVP: no 5. Has a defibrillator: no 6. Has been advised in past to take antibiotics in advance of a procedure like teeth cleaning: no 7. Family history of colon cancer: yes, grandfather: age 66's  8. Alcohol Use: no 9. Illicit drug Use: no 10. History of sleep apnea: yes, doesn't use CPAP 11. History of coronary artery or other vascular stents placed within the last 12 months: no 12. History of any prior anesthesia complications: no 13. Body mass index is 37.1 kg/m.    MEDICATIONS & ALLERGIES:    Patient reports the following regarding taking any blood thinners:   Plavix? no Aspirin? no Coumadin? no Brilinta? no Xarelto? no Eliquis? no Pradaxa? no Savaysa? no Effient? no  Patient confirms/reports the following medications:  Current Outpatient Medications  Medication Sig Dispense Refill  . albuterol (VENTOLIN HFA) 108 (90 Base) MCG/ACT inhaler Inhale 2 puffs into the lungs every 6 (six) hours as needed for wheezing or shortness of breath. (Patient taking differently: Inhale 2 puffs into the lungs as needed for wheezing or shortness of breath.) 3 Inhaler 2  . BIOTIN PO Take 1 tablet by mouth daily.    . Cholecalciferol (VITAMIN D3) 5000 units CAPS Take 10,000 Units by mouth daily.    . COSENTYX SENSOREADY 300 DOSE 150 MG/ML SOAJ Inject 300 mg into the skin every 28 (twenty-eight) days.     . folic acid (FOLVITE) 1 MG tablet Take 1 mg by mouth daily.    . isosorbide mononitrate (IMDUR) 30 MG 24 hr tablet TAKE 1/2 TABLET BY MOUTH DAILY (Patient taking differently: as  needed. Takes one tablet as needed.) 90 tablet 0  . methotrexate 50 MG/2ML injection SMARTSIG:0.6 Milliliter(s) IM Once a Week    . naproxen (NAPROSYN) 500 MG tablet Take 500 mg by mouth as needed.    . NONFORMULARY OR COMPOUNDED ITEM Inject 10 mg into the muscle 2 (two) times a week. Testosterone cypionate in olive  oil (25 mg/mL).  Dispense 5 mL vial. 1 each 3  . NP THYROID 120 MG tablet Take 1 tablet (120 mg total) by mouth daily before breakfast. 30 tablet 3  . traMADol (ULTRAM) 50 MG tablet Take 50 mg by mouth as needed.     No current facility-administered medications for this visit.    Patient confirms/reports the following allergies:  No Known Allergies  No orders of the defined types were placed in this encounter.   AUTHORIZATION INFORMATION Primary Insurance: [place],  [place] #:[id number] ,  Group #: [id number] Pre-Cert / Berkley Harvey required: Yes, approved online 04/09/3975-[birth year]/34/1938 Pre-Cert / Berkley Harvey #: [id number]  SCHEDULE INFORMATION: Procedure has been scheduled as follows:  Date: 03/26/2021, Time: AM procedure Location: [facility] with Dr. Marletta Lor  This Gastroenterology Pre-Precedure Review Form is being routed to the following provider(s): Lewie Loron, NP

## 2021-02-18 NOTE — Patient Instructions (Addendum)
Crystal Houston  [birth year] MRN: [id number]    COVID TEST: 03/24/2021 AT 8:30 AM.    Procedure Date: 03/26/2021 Time to register: You will receive a call from the hospital a few days before your procedure. Place to register: [facility] Scheduled provider: Dr. Abbey Chatters    PREPARATION FOR COLONOSCOPY WITH SUPREP BOWEL PREP KIT  Note: Suprep Bowel Prep Kit is a split-dose (2day) regimen. Consumption of BOTH 6-ounce bottles is required for a complete prep.  Please notify us immediately if you are diabetic, take iron supplements, or if you are on Coumadin or any other blood thinners.  Please hold the following medications: n/a                                                                                                                                                  2 DAYS BEFORE PROCEDURE:  DATE:  03/24/2021   DAY: Wedsnesday Begin clear liquid diet AFTER your lunch meal. NO SOLID FOODS after this point.  1 DAY BEFORE PROCEDURE:  DATE: 03/25/2021  DAY:  Thursday Continue clear liquids the entire day - NO SOLID FOOD.   Diabetic medications adjustments for today: n/a  At 8:00am: Complete steps 1 through 4 below, using ONE (1) 6-ounce bottle, before going to bed. Step 1:  Pour ONE (1) 6-ounce bottle of SUPREP liquid into the mixing container.  Step 2:  Add cool drinking water to the 16 ounce line on the container and mix.  Note: Dilute the solution concentrate as directed prior to use. Step 3:  DRINK ALL the liquid in the container. Step 4:  You MUST drink an additional two (2) or more 16 ounce containers of water over the next one (1) hour.    At 6:00pm: Complete steps 1 through 4 below, using ONE (1) 6-ounce bottle, before going to bed. Step 1:  Pour ONE (1) 6-ounce bottle of SUPREP liquid into the mixing container.  Step 2:  Add cool drinking water to the 16 ounce line on the container and mix.  Note: Dilute the solution concentrate as directed prior to use. Step 3:   DRINK ALL the liquid in the container. Step 4:  You MUST drink an additional two (2) or more 16 ounce containers of water over the next one (1) hour.   Continue clear liquids until midnight.  Nothing by mouth after midnight.  DAY OF PROCEDURE:   DATE: 03/26/2021  DAY: Friday If you take medications for your heart, blood pressure, or breathing, you may take these medications.  Diabetic medications adjustments for today: n/a  You may take your morning medications with sip of water unless we have instructed otherwise.    Please see below for Dietary Information.  CLEAR LIQUIDS INCLUDE:  Water Jello (NOT red in color)   Ice Popsicles (NOT red in color)  Tea (sugar ok, no milk/cream) Powdered fruit flavored drinks  Coffee (sugar ok, no milk/cream) Gatorade/ Lemonade/ Kool-Aid  (NOT red in color)   Juice: apple, white grape, white cranberry Soft drinks  Clear bullion, consomme, broth (fat free beef/chicken/vegetable)  Carbonated beverages (any kind)  Strained chicken noodle soup Hard Candy   Remember: Clear liquids are liquids that will allow you to see your fingers on the other side of a clear glass. Be sure liquids are NOT red in color, and not cloudy, but CLEAR.  DO NOT EAT OR DRINK ANY OF THE FOLLOWING:  Dairy products of any kind   Cranberry juice Tomato juice / V8 juice   Grapefruit juice Orange juice     Red grape juice  Do not eat any solid foods, including such foods as: cereal, oatmeal, yogurt, fruits, vegetables, creamed soups, eggs, bread, crackers, pureed foods in a blender, etc.   HELPFUL HINTS FOR DRINKING PREP SOLUTION:   Make sure prep is extremely cold. Mix and refrigerate the the morning of the prep. You may also put in the freezer.   You may try mixing some Crystal Light or Country Time Lemonade if you prefer. Mix in small amounts; add more if necessary.  Try drinking through a straw  Rinse mouth with water or a mouthwash between glasses, to remove  after-taste.  Try sipping on a cold beverage /ice/ popsicles between glasses of prep.  Place a piece of sugar-free hard candy in mouth between glasses.  If you become nauseated, try consuming smaller amounts, or stretch out the time between glasses. Stop for 30-60 minutes, then slowly start back drinking.     OTHER INSTRUCTIONS  You will need a responsible adult at least 51 years of age to accompany you and drive you home. This person must remain in the waiting room during your procedure. The hospital will cancel your procedure if you do not have a responsible adult with you.   1. Wear loose fitting clothing that is easily removed. 2. Leave jewelry and other valuables at home.  3. Remove all body piercing jewelry and leave at home. 4. Total time from sign-in until discharge is approximately 2-3 hours. 5. You should go home directly after your procedure and rest. You can resume normal activities the day after your procedure. 6. The day of your procedure you should not:  Drive  Make legal decisions  Operate machinery  Drink alcohol  Return to work   You may call the office (Dept: [phone]) before 5:00pm, or page the doctor on call [phone]) after 5:00pm, for further instructions, if necessary.   Insurance Information YOU WILL NEED TO CHECK WITH YOUR INSURANCE COMPANY FOR THE BENEFITS OF COVERAGE YOU HAVE FOR THIS PROCEDURE.  UNFORTUNATELY, NOT ALL INSURANCE COMPANIES HAVE BENEFITS TO COVER ALL OR PART OF THESE TYPES OF PROCEDURES.  IT IS YOUR RESPONSIBILITY TO CHECK YOUR BENEFITS, HOWEVER, WE WILL BE GLAD TO ASSIST YOU WITH ANY CODES YOUR INSURANCE COMPANY MAY NEED.    PLEASE NOTE THAT MOST INSURANCE COMPANIES WILL NOT COVER A SCREENING COLONOSCOPY FOR PEOPLE UNDER THE AGE OF 50  IF YOU HAVE BCBS INSURANCE, YOU MAY HAVE BENEFITS FOR A SCREENING COLONOSCOPY BUT IF POLYPS ARE FOUND THE DIAGNOSIS WILL CHANGE AND THEN YOU MAY HAVE A DEDUCTIBLE THAT WILL NEED TO BE MET. SO  PLEASE MAKE SURE YOU CHECK YOUR BENEFITS FOR A SCREENING COLONOSCOPY AS WELL AS A DIAGNOSTIC COLONOSCOPY.

## 2021-02-19 NOTE — Progress Notes (Signed)
Appropriate. ASA 2.  

## 2021-02-22 ENCOUNTER — Other Ambulatory Visit: Payer: Self-pay | Admitting: *Deleted

## 2021-03-12 ENCOUNTER — Ambulatory Visit
Admission: EM | Admit: 2021-03-12 | Discharge: 2021-03-12 | Disposition: A | Discharge status: Home / Self Care | Payer: 59 | Attending: Internal Medicine | Admitting: Internal Medicine

## 2021-03-12 ENCOUNTER — Encounter: Payer: Self-pay | Admitting: Emergency Medicine

## 2021-03-12 ENCOUNTER — Ambulatory Visit (INDEPENDENT_AMBULATORY_CARE_PROVIDER_SITE_OTHER): Payer: 59

## 2021-03-12 DIAGNOSIS — X501XXA Overexertion from prolonged static or awkward postures, initial encounter: Secondary | ICD-10-CM | POA: Diagnosis not present

## 2021-03-12 DIAGNOSIS — S93409A Sprain of unspecified ligament of unspecified ankle, initial encounter: Secondary | ICD-10-CM

## 2021-03-12 DIAGNOSIS — S99912A Unspecified injury of left ankle, initial encounter: Secondary | ICD-10-CM

## 2021-03-12 DIAGNOSIS — M25571 Pain in right ankle and joints of right foot: Secondary | ICD-10-CM

## 2021-03-12 NOTE — ED Triage Notes (Signed)
Fell on Wednesday bilateral ankle pain, left ankle hurts worse.  History of ankle surgery.

## 2021-03-12 NOTE — Discharge Instructions (Addendum)
Your final read for the xrays dont show any fractures. The right one has some arthritis.  Continue using the ace bandage for a week for the L ankle. Follow up with your orthopedist next week.  You may take Ibuprofen 800 mg every 8 hours as needed for pain.

## 2021-03-22 ENCOUNTER — Telehealth: Payer: Self-pay | Admitting: Internal Medicine

## 2021-03-22 ENCOUNTER — Encounter (HOSPITAL_COMMUNITY): Payer: Self-pay

## 2021-03-22 MED ORDER — NA SULFATE-K SULFATE-MG SULF 17.5-3.13-1.6 GM/177ML PO SOLN: 1.0000 | Freq: Once | ORAL | 0 refills | Status: AC

## 2021-03-22 NOTE — Telephone Encounter (Signed)
Pt is scheduled procedure with Dr Marletta Lor on Friday and said her prep hasn't been called in yet. Please advise. [phone]

## 2021-03-22 NOTE — Telephone Encounter (Signed)
Spoke to pt and informed her that RX was sent.  Pt informed to call me back if any problems.

## 2021-03-22 NOTE — Addendum Note (Signed)
Addended by: Noreene Larsson on: 03/22/2021 02:08 PM   Modules accepted: Orders

## 2021-03-24 ENCOUNTER — Other Ambulatory Visit (HOSPITAL_COMMUNITY)
Admission: RE | Admit: 2021-03-24 | Discharge: 2021-03-24 | Disposition: A | Discharge status: Home / Self Care | Payer: 59 | Source: Ambulatory Visit | Attending: Internal Medicine | Admitting: Internal Medicine

## 2021-03-24 ENCOUNTER — Other Ambulatory Visit: Payer: Self-pay

## 2021-03-24 DIAGNOSIS — Z20822 Contact with and (suspected) exposure to covid-19: Secondary | ICD-10-CM | POA: Diagnosis not present

## 2021-03-24 DIAGNOSIS — Z01812 Encounter for preprocedural laboratory examination: Secondary | ICD-10-CM | POA: Diagnosis not present

## 2021-03-24 DIAGNOSIS — K648 Other hemorrhoids: Secondary | ICD-10-CM | POA: Diagnosis not present

## 2021-03-24 DIAGNOSIS — Z79890 Hormone replacement therapy (postmenopausal): Secondary | ICD-10-CM | POA: Diagnosis not present

## 2021-03-24 DIAGNOSIS — Z1211 Encounter for screening for malignant neoplasm of colon: Secondary | ICD-10-CM | POA: Diagnosis not present

## 2021-03-24 DIAGNOSIS — K5730 Diverticulosis of large intestine without perforation or abscess without bleeding: Secondary | ICD-10-CM | POA: Diagnosis not present

## 2021-03-24 LAB — PREGNANCY, URINE: Preg Test, Ur: NEGATIVE

## 2021-03-24 LAB — SARS CORONAVIRUS 2 (TAT 6-24 HRS): SARS Coronavirus 2: NEGATIVE

## 2021-03-26 ENCOUNTER — Ambulatory Visit (HOSPITAL_COMMUNITY): Payer: 59 | Admitting: Anesthesiology

## 2021-03-26 ENCOUNTER — Ambulatory Visit (HOSPITAL_COMMUNITY)
Admission: RE | Admit: 2021-03-26 | Discharge: 2021-03-26 | Disposition: A | Discharge status: Home / Self Care | Payer: 59 | Attending: Internal Medicine | Admitting: Internal Medicine

## 2021-03-26 ENCOUNTER — Other Ambulatory Visit: Payer: Self-pay

## 2021-03-26 ENCOUNTER — Encounter (HOSPITAL_COMMUNITY): Payer: Self-pay

## 2021-03-26 ENCOUNTER — Encounter (HOSPITAL_COMMUNITY)
Admission: RE | Disposition: A | Discharge status: Home / Self Care | Payer: Self-pay | Source: Home / Self Care | Attending: Internal Medicine

## 2021-03-26 DIAGNOSIS — K648 Other hemorrhoids: Secondary | ICD-10-CM | POA: Insufficient documentation

## 2021-03-26 DIAGNOSIS — K5730 Diverticulosis of large intestine without perforation or abscess without bleeding: Secondary | ICD-10-CM | POA: Insufficient documentation

## 2021-03-26 DIAGNOSIS — Z1211 Encounter for screening for malignant neoplasm of colon: Secondary | ICD-10-CM | POA: Insufficient documentation

## 2021-03-26 DIAGNOSIS — Z79890 Hormone replacement therapy (postmenopausal): Secondary | ICD-10-CM | POA: Insufficient documentation

## 2021-03-26 HISTORY — PX: COLONOSCOPY WITH PROPOFOL: SHX5780

## 2021-03-26 SURGERY — COLONOSCOPY WITH PROPOFOL
Anesthesia: General

## 2021-03-26 MED ORDER — PROPOFOL 500 MG/50ML IV EMUL
INTRAVENOUS | Status: DC | PRN
  Administered 2021-03-26: 150 ug/kg/min via INTRAVENOUS

## 2021-03-26 MED ORDER — CHLORHEXIDINE GLUCONATE CLOTH 2 % EX PADS: 6.0000 | MEDICATED_PAD | Freq: Once | CUTANEOUS | Status: DC

## 2021-03-26 MED ORDER — PROPOFOL 10 MG/ML IV BOLUS
INTRAVENOUS | Status: DC | PRN
  Administered 2021-03-26 (×2): 50 mg via INTRAVENOUS

## 2021-03-26 MED ORDER — STERILE WATER FOR IRRIGATION IR SOLN
Status: DC | PRN
  Administered 2021-03-26: 1.5 mL

## 2021-03-26 MED ORDER — LACTATED RINGERS IV SOLN
INTRAVENOUS | Status: DC
  Administered 2021-03-26: 1000 mL via INTRAVENOUS

## 2021-03-26 NOTE — Discharge Instructions (Addendum)
Colonoscopy Discharge Instructions  Read the instructions outlined below and refer to this sheet in the next few weeks. These discharge instructions provide you with general information on caring for yourself after you leave the hospital. Your doctor may also give you specific instructions. While your treatment has been planned according to the most current medical practices available, unavoidable complications occasionally occur.   ACTIVITY  You may resume your regular activity, but move at a slower pace for the next 24 hours.   Take frequent rest periods for the next 24 hours.   Walking will help get rid of the air and reduce the bloated feeling in your belly (abdomen).   No driving for 24 hours (because of the medicine (anesthesia) used during the test).    Do not sign any important legal documents or operate any machinery for 24 hours (because of the anesthesia used during the test).  NUTRITION  Drink plenty of fluids.   You may resume your normal diet as instructed by your doctor.   Begin with a light meal and progress to your normal diet. Heavy or fried foods are harder to digest and may make you feel sick to your stomach (nauseated).   Avoid alcoholic beverages for 24 hours or as instructed.  MEDICATIONS  You may resume your normal medications unless your doctor tells you otherwise.  WHAT YOU CAN EXPECT TODAY  Some feelings of bloating in the abdomen.   Passage of more gas than usual.   Spotting of blood in your stool or on the toilet paper.  IF YOU HAD POLYPS REMOVED DURING THE COLONOSCOPY:  No aspirin products for 7 days or as instructed.   No alcohol for 7 days or as instructed.   Eat a soft diet for the next 24 hours.  FINDING OUT THE RESULTS OF YOUR TEST Not all test results are available during your visit. If your test results are not back during the visit, make an appointment with your caregiver to find out the results. Do not assume everything is normal if  you have not heard from your caregiver or the medical facility. It is important for you to follow up on all of your test results.  SEEK IMMEDIATE MEDICAL ATTENTION IF:  You have more than a spotting of blood in your stool.   Your belly is swollen (abdominal distention).   You are nauseated or vomiting.   You have a temperature over 101.   You have abdominal pain or discomfort that is severe or gets worse throughout the day.   Your colonoscopy was relatively unremarkable.  I did not find a polyps or evidence of colon cancer.  The right side of your colon was not ideally prepped though I can rule out large polyps/cancer.  Recommend that we repeat this in 5 years due to your family history and borderline colon prep. You also have diverticulosis and internal hemorrhoids. I would recommend increasing fiber in your diet or adding OTC Benefiber/Metamucil. Be sure to drink at least 4 to 6 glasses of water daily. Follow-up with GI as needed.   I hope you have a great rest of your week!  Hennie Duos. Marletta Lor, D.O. Gastroenterology and Hepatology [facility]   Diverticulosis  Diverticulosis is a condition that develops when small pouches (diverticula) form in the wall of the large intestine (colon). The colon is where water is absorbed and stool (feces) is formed. The pouches form when the inside layer of the colon pushes through weak spots in  the outer layers of the colon. You may have a few pouches or many of them. The pouches usually do not cause problems unless they become inflamed or infected. When this happens, the condition is called diverticulitis. What are the causes? The cause of this condition is not known. What increases the risk? The following factors may make you more likely to develop this condition:  Being older than age 67. Your risk for this condition increases with age. Diverticulosis is rare among people younger than age 70. By age 66, many people have  it.  Eating a low-fiber diet.  Having frequent constipation.  Being overweight.  Not getting enough exercise.  Smoking.  Taking over-the-counter pain medicines, like aspirin and ibuprofen.  Having a family history of diverticulosis. What are the signs or symptoms? In most people, there are no symptoms of this condition. If you do have symptoms, they may include:  Bloating.  Cramps in the abdomen.  Constipation or diarrhea.  Pain in the lower left side of the abdomen. How is this diagnosed? Because diverticulosis usually has no symptoms, it is most often diagnosed during an exam for other colon problems. The condition may be diagnosed by:  Using a flexible scope to examine the colon (colonoscopy).  Taking an X-ray of the colon after dye has been put into the colon (barium enema).  Having a CT scan. How is this treated? You may not need treatment for this condition. Your health care provider may recommend treatment to prevent problems. You may need treatment if you have symptoms or if you previously had diverticulitis. Treatment may include:  Eating a high-fiber diet.  Taking a fiber supplement.  Taking a live bacteria supplement (probiotic).  Taking medicine to relax your colon.   Follow these instructions at home: Medicines  Take over-the-counter and prescription medicines only as told by your health care provider.  If told by your health care provider, take a fiber supplement or probiotic. Constipation prevention Your condition may cause constipation. To prevent or treat constipation, you may need to:  Drink enough fluid to keep your urine pale yellow.  Take over-the-counter or prescription medicines.  Eat foods that are high in fiber, such as beans, whole grains, and fresh fruits and vegetables.  Limit foods that are high in fat and processed sugars, such as fried or sweet foods.   General instructions  Try not to strain when you have a bowel  movement.  Keep all follow-up visits as told by your health care provider. This is important. Contact a health care provider if you:  Have pain in your abdomen.  Have bloating.  Have cramps.  Have not had a bowel movement in 3 days. Get help right away if:  Your pain gets worse.  Your bloating becomes very bad.  You have a fever or chills, and your symptoms suddenly get worse.  You vomit.  You have bowel movements that are bloody or black.  You have bleeding from your rectum. Summary  Diverticulosis is a condition that develops when small pouches (diverticula) form in the wall of the large intestine (colon).  You may have a few pouches or many of them.  This condition is most often diagnosed during an exam for other colon problems.  Treatment may include increasing the fiber in your diet, taking supplements, or taking medicines. This information is not intended to replace advice given to you by your health care provider. Make sure you discuss any questions you have with your health care  provider. Document Revised: 06/20/2019 Document Reviewed: 06/20/2019 Elsevier Patient Education  2021 [employer] Inc[employer]  Hemorrhoids Hemorrhoids are swollen veins in and around the rectum or anus. There are two types of hemorrhoids:  Internal hemorrhoids. These occur in the veins that are just inside the rectum. They may poke through to the outside and become irritated and painful.  External hemorrhoids. These occur in the veins that are outside the anus and can be felt as a painful swelling or hard lump near the anus. Most hemorrhoids do not cause serious problems, and they can be managed with home treatments such as diet and lifestyle changes. If home treatments do not help the symptoms, procedures can be done to shrink or remove the hemorrhoids. What are the causes? This condition is caused by increased pressure in the anal area. This pressure may result from various things,  including:  Constipation.  Straining to have a bowel movement.  Diarrhea.  Pregnancy.  Obesity.  Sitting for long periods of time.  Heavy lifting or other activity that causes you to strain.  Anal sex.  Riding a bike for a long period of time. What are the signs or symptoms? Symptoms of this condition include:  Pain.  Anal itching or irritation.  Rectal bleeding.  Leakage of stool (feces).  Anal swelling.  One or more lumps around the anus. How is this diagnosed? This condition can often be diagnosed through a visual exam. Other exams or tests may also be done, such as:  An exam that involves feeling the rectal area with a gloved hand (digital rectal exam).  An exam of the anal canal that is done using a small tube (anoscope).  A blood test, if you have lost a significant amount of blood.  A test to look inside the colon using a flexible tube with a camera on the end (sigmoidoscopy or colonoscopy). How is this treated? This condition can usually be treated at home. However, various procedures may be done if dietary changes, lifestyle changes, and other home treatments do not help your symptoms. These procedures can help make the hemorrhoids smaller or remove them completely. Some of these procedures involve surgery, and others do not. Common procedures include:  Rubber band ligation. Rubber bands are placed at the base of the hemorrhoids to cut off their blood supply.  Sclerotherapy. Medicine is injected into the hemorrhoids to shrink them.  Infrared coagulation. A type of light energy is used to get rid of the hemorrhoids.  Hemorrhoidectomy surgery. The hemorrhoids are surgically removed, and the veins that supply them are tied off.  Stapled hemorrhoidopexy surgery. The surgeon staples the base of the hemorrhoid to the rectal wall. Follow these instructions at home: Eating and drinking  Eat foods that have a lot of fiber in them, such as whole grains, beans,  nuts, fruits, and vegetables.  Ask your health care provider about taking products that have added fiber (fiber supplements).  Reduce the amount of fat in your diet. You can do this by eating low-fat dairy products, eating less red meat, and avoiding processed foods.  Drink enough fluid to keep your urine pale yellow.   Managing pain and swelling  Take warm sitz baths for 20 minutes, 3-4 times a day to ease pain and discomfort. You may do this in a bathtub or using a portable sitz bath that fits over the toilet.  If directed, apply ice to the affected area. Using ice packs between sitz baths may be helpful. ? Put ice  in a plastic bag. ? Place a towel between your skin and the bag. ? Leave the ice on for 20 minutes, 2-3 times a day.   General instructions  Take over-the-counter and prescription medicines only as told by your health care provider.  Use medicated creams or suppositories as told.  Get regular exercise. Ask your health care provider how much and what kind of exercise is best for you. In general, you should do moderate exercise for at least 30 minutes on most days of the week (150 minutes each week). This can include activities such as walking, biking, or yoga.  Go to the bathroom when you have the urge to have a bowel movement. Do not wait.  Avoid straining to have bowel movements.  Keep the anal area dry and clean. Use wet toilet paper or moist towelettes after a bowel movement.  Do not sit on the toilet for long periods of time. This increases blood pooling and pain.  Keep all follow-up visits as told by your health care provider. This is important. Contact a health care provider if you have:  Increasing pain and swelling that are not controlled by treatment or medicine.  Difficulty having a bowel movement, or you are unable to have a bowel movement.  Pain or inflammation outside the area of the hemorrhoids. Get help right away if you have:  Uncontrolled  bleeding from your rectum. Summary  Hemorrhoids are swollen veins in and around the rectum or anus.  Most hemorrhoids can be managed with home treatments such as diet and lifestyle changes.  Taking warm sitz baths can help ease pain and discomfort.  In severe cases, procedures or surgery can be done to shrink or remove the hemorrhoids. This information is not intended to replace advice given to you by your health care provider. Make sure you discuss any questions you have with your health care provider. Document Revised: 04/19/2019 Document Reviewed: 04/12/2018 Elsevier Patient Education  2021 [employer].

## 2021-03-26 NOTE — H&P (Signed)
Primary Care Physician:  Wilson Singer, MD Primary Gastroenterologist:  Dr. Marletta Lor  Pre-Procedure History & Physical: HPI:  Crystal Houston is a 50 y.o. female is here for a colonoscopy for colon cancer screening purposes.  Patient denies any family history of colorectal cancer.  No melena or hematochezia.  No abdominal pain or unintentional weight loss.  No change in bowel habits.  Overall feels well from a GI standpoint.  Past Medical History:  Diagnosis Date  . Anxiety   . Depression   . OSA on CPAP 08/10/2017  . Psoriatic arthritis [facility])     Past Surgical History:  Procedure Laterality Date  . FRACTURE SURGERY    . I & D EXTREMITY  11/10/2011   Procedure: IRRIGATION AND DEBRIDEMENT EXTREMITY;  Surgeon: Loanne Drilling;  Location: [facility] OR;  Service: Orthopedics;  Laterality: Right;  . LEFT HEART CATH AND CORONARY ANGIOGRAPHY N/A 06/18/2018   Procedure: LEFT HEART CATH AND CORONARY ANGIOGRAPHY;  Surgeon: Runell Gess, MD;  Location: [facility] INVASIVE CV LAB;  Service: Cardiovascular;  Laterality: N/A;  . ORIF ANKLE FRACTURE  11/10/2011   Procedure: OPEN REDUCTION INTERNAL FIXATION (ORIF) ANKLE FRACTURE;  Surgeon: Loanne Drilling;  Location: [facility] OR;  Service: Orthopedics;  Laterality: Right;  with screw applicaton    Prior to Admission medications   Medication Sig Start Date End Date Taking? Authorizing Provider  Cholecalciferol (VITAMIN D3) 5000 units CAPS Take 10,000 Units by mouth daily.   Yes [provider]  COSENTYX SENSOREADY 300 DOSE 150 MG/ML SOAJ Inject 300 mg into the skin every 28 (twenty-eight) days.  02/13/18  Yes [provider]  cyclobenzaprine (FLEXERIL) 10 MG tablet Take 10 mg by mouth daily as needed (Back pain).   Yes [provider]  folic acid (FOLVITE) 1 MG tablet Take 1 mg by mouth daily. 09/14/20  Yes [provider]  methotrexate 50 MG/2ML injection Inject 50 mg/m2 into the skin every Friday. Dose is 0.6 ml 09/14/20  Yes  [provider]  metroNIDAZOLE (METROGEL) 1 % gel Apply 1 application topically daily as needed (Rosacea).   Yes [provider]  naproxen (NAPROSYN) 500 MG tablet Take 500 mg by mouth 2 (two) times daily as needed (Back pain). 11/30/17  Yes [provider]  NONFORMULARY OR COMPOUNDED ITEM Inject 10 mg into the muscle 2 (two) times a week. Testosterone cypionate in olive  oil (25 mg/mL).  Dispense 5 mL vial. Patient taking differently: Inject 10 mg into the muscle 2 (two) times a week. Testosterone cypionate in olive  oil (25 mg/mL).  Dispense 5 mL vial.  Dose .4 mL 01/14/21  Yes Wilson Singer, MD  NP THYROID 120 MG tablet Take 1 tablet (120 mg total) by mouth daily before breakfast. 10/27/20  Yes Gosrani, Nimish C, MD  traMADol (ULTRAM) 50 MG tablet Take 50 mg by mouth daily as needed (Back pain). 06/18/20  Yes [provider]  albuterol (VENTOLIN HFA) 108 (90 Base) MCG/ACT inhaler Inhale 2 puffs into the lungs every 6 (six) hours as needed for wheezing or shortness of breath. 04/12/19   Shane Crutch, MD  BIOTIN PO Take 1 tablet by mouth daily.    [provider]  isosorbide mononitrate (IMDUR) 30 MG 24 hr tablet TAKE 1/2 TABLET BY MOUTH DAILY Patient not taking: Reported on 03/15/2021 06/24/19   Runell Gess, MD    Allergies as of 02/22/2021  . (No Known Allergies)    Family History  Problem  Relation Age of Onset  . Cancer Daughter        osteosarcoma of leg  . Cancer Paternal Grandfather        colon    Social History   Socioeconomic History  . Marital status: Married    Spouse name: Not on file  . Number of children: 3  . Years of education: Not on file  . Highest education level: Not on file  Occupational History  . Occupation: Building control surveyor: [employer]  Tobacco Use  . Smoking status: Never Smoker  . Smokeless tobacco: Never Used  Vaping Use  . Vaping Use: Never used  Substance and Sexual Activity  .  Alcohol use: Yes    Alcohol/week: 2.0 standard drinks    Types: 2 Glasses of wine per week    Comment: Very rare  . Drug use: No  . Sexual activity: Never    Birth control/protection: I.U.D.  Other Topics Concern  . Not on file  Social History Narrative   Daughter died in Mar 21, 2013 of osteosarcoma.Lives with fiancee and kids.Works for [employer] from home.   Social Determinants of Health   Financial Resource Strain: Not on file  Food Insecurity: Not on file  Transportation Needs: Not on file  Physical Activity: Not on file  Stress: Not on file  Social Connections: Not on file  Intimate Partner Violence: Not on file    Review of Systems: See HPI, otherwise negative ROS  Physical Exam: Vital signs in last 24 hours: Temp:  [97.9 F (36.6 C)] 97.9 F (36.6 C) (04/22 0820) Pulse Rate:  [101] 101 (04/22 0820) Resp:  [15] 15 (04/22 0820) BP: (138)/(84) 138/84 (04/22 0820) SpO2:  [96 %] 96 % (04/22 0820) Weight:  [110.7 kg] 110.7 kg (04/22 0820)   General:   Alert,  Well-developed, well-nourished, pleasant and cooperative in NAD Head:  Normocephalic and atraumatic. Eyes:  Sclera clear, no icterus.   Conjunctiva pink. Ears:  Normal auditory acuity. Nose:  No deformity, discharge,  or lesions. Mouth:  No deformity or lesions, dentition normal. Neck:  Supple; no masses or thyromegaly. Lungs:  Clear throughout to auscultation.   No wheezes, crackles, or rhonchi. No acute distress. Heart:  Regular rate and rhythm; no murmurs, clicks, rubs,  or gallops. Abdomen:  Soft, nontender and nondistended. No masses, hepatosplenomegaly or hernias noted. Normal bowel sounds, without guarding, and without rebound.   Msk:  Symmetrical without gross deformities. Normal posture. Extremities:  Without clubbing or edema. Neurologic:  Alert and  oriented x4;  grossly normal neurologically. Skin:  Intact without significant lesions or rashes. Cervical Nodes:  No significant cervical adenopathy. Psych:   Alert and cooperative. Normal mood and affect.  Impression/Plan: Crystal Houston is here for a colonoscopy to be performed for colon cancer screening purposes.  The risks of the procedure including infection, bleed, or perforation as well as benefits, limitations, alternatives and imponderables have been reviewed with the patient. Questions have been answered. All parties agreeable.

## 2021-03-26 NOTE — Anesthesia Preprocedure Evaluation (Signed)
Anesthesia Evaluation  Patient identified by MRN, date of birth, ID band Patient awake    Reviewed: Allergy & Precautions, H&P , NPO status , Patient's Chart, lab work & pertinent test results, reviewed documented beta blocker date and time   Airway Mallampati: II  TM Distance: >3 FB Neck ROM: full    Dental no notable dental hx.    Pulmonary sleep apnea and Continuous Positive Airway Pressure Ventilation ,    Pulmonary exam normal breath sounds clear to auscultation       Cardiovascular Exercise Tolerance: Good negative cardio ROS   Rhythm:regular Rate:Normal     Neuro/Psych PSYCHIATRIC DISORDERS Anxiety Depression negative neurological ROS     GI/Hepatic negative GI ROS, Neg liver ROS,   Endo/Other  negative endocrine ROS  Renal/GU negative Renal ROS  negative genitourinary   Musculoskeletal   Abdominal   Peds  Hematology negative hematology ROS (+)   Anesthesia Other Findings   Reproductive/Obstetrics negative OB ROS                             Anesthesia Physical Anesthesia Plan  ASA: III  Anesthesia Plan: General   Post-op Pain Management:    Induction:   PONV Risk Score and Plan: Propofol infusion  Airway Management Planned:   Additional Equipment:   Intra-op Plan:   Post-operative Plan:   Informed Consent: I have reviewed the patients History and Physical, chart, labs and discussed the procedure including the risks, benefits and alternatives for the proposed anesthesia with the patient or authorized representative who has indicated his/her understanding and acceptance.     Dental Advisory Given  Plan Discussed with: CRNA  Anesthesia Plan Comments:         Anesthesia Quick Evaluation

## 2021-03-26 NOTE — Op Note (Signed)
[facility] Patient Name: Crystal Houston Procedure Date: 03/26/2021 9:22 AM MRN: [id number] Date of Birth: 1971/01/21 Attending MD: Elon Alas. Abbey Chatters DO CSN: [id number] Age: 50 Admit Type: Outpatient Procedure:                Colonoscopy Indications:              Screening for colorectal malignant neoplasm Providers:                Elon Alas. Abbey Chatters, DO, Charlsie Quest. Theda Sers RN, RN,                            Nelma Rothman, Technician Referring MD:              Medicines:                See the Anesthesia note for documentation of the                            administered medications Complications:            No immediate complications. Estimated Blood Loss:     Estimated blood loss: none. Procedure:                Pre-Anesthesia Assessment:                           - The anesthesia plan was to use monitored                            anesthesia care (MAC).                           After obtaining informed consent, the colonoscope                            was passed under direct vision. Throughout the                            procedure, the patient's blood pressure, pulse, and                            oxygen saturations were monitored continuously. The                            [id number]-HQ190L ([id number]) scope was introduced through                            the anus and advanced to the the cecum, identified                            by appendiceal orifice and ileocecal valve. The                            colonoscopy was performed without difficulty. The                            patient tolerated the procedure well.  The quality                            of the bowel preparation was evaluated using the                            BBPS [facility] Bowel Preparation Scale) with scores                            of: Right Colon = 2 (minor amount of residual                            staining, small fragments of stool and/or opaque                            liquid, but mucosa  seen well), Transverse Colon = 3                            (entire mucosa seen well with no residual staining,                            small fragments of stool or opaque liquid) and Left                            Colon = 3 (entire mucosa seen well with no residual                            staining, small fragments of stool or opaque                            liquid). The total BBPS score equals 8. The quality                            of the bowel preparation was good. Scope In: 9:37:09 AM Scope Out: 9:50:05 AM Scope Withdrawal Time: 0 hours 9 minutes 0 seconds  Total Procedure Duration: 0 hours 12 minutes 56 seconds  Findings:      The perianal and digital rectal examinations were normal.      Non-bleeding internal hemorrhoids were found during endoscopy.      Multiple small-mouthed diverticula were found in the sigmoid colon.      A moderate amount of stool was found in the ascending colon and in the       cecum, making visualization difficult. Lavage of the area was performed       using copious amounts of sterile water, resulting in clearance with fair       visualization. Impression:               - Non-bleeding internal hemorrhoids.                           - Diverticulosis in the sigmoid colon.                           - Stool in the ascending colon and  in the cecum.                           - No specimens collected. Moderate Sedation:      Per Anesthesia Care Recommendation:           - Patient has a contact number available for                            emergencies. The signs and symptoms of potential                            delayed complications were discussed with the                            patient. Return to normal activities tomorrow.                            Written discharge instructions were provided to the                            patient.                           - Resume previous diet.                           - Continue present  medications.                           - Repeat colonoscopy in 5 years for screening                            purposes due to family history and borderline R                            sided colon prep.                           - Return to GI clinic PRN. Procedure Code(s):        --- Professional ---                           B0488, Colorectal cancer screening; colonoscopy on                            individual not meeting criteria for high risk Diagnosis Code(s):        --- Professional ---                           Z12.11, Encounter for screening for malignant                            neoplasm of colon                           K64.8, Other hemorrhoids  K57.30, Diverticulosis of large intestine without                            perforation or abscess without bleeding CPT copyright 2019 American Medical Association. All rights reserved. The codes documented in this report are preliminary and upon coder review may  be revised to meet current compliance requirements. Elon Alas. Abbey Chatters, DO [place] Abbey Chatters, DO 03/26/2021 9:59:00 AM This report has been signed electronically. Number of Addenda: 0

## 2021-03-26 NOTE — Transfer of Care (Signed)
Immediate Anesthesia Transfer of Care Note  Patient: Crystal Houston  Procedure(s) Performed: COLONOSCOPY WITH PROPOFOL (N/A )  Patient Location: PACU  Anesthesia Type:General  Level of Consciousness: awake, alert , oriented and patient cooperative  Airway & Oxygen Therapy: Patient Spontanous Breathing  Post-op Assessment: Report given to RN, Post -op Vital signs reviewed and stable and Patient moving all extremities X 4  Post vital signs: Reviewed and stable  Last Vitals:  Vitals Value Taken Time  BP 99/61 03/26/21 0954  Temp 36.5 C 03/26/21 0954  Pulse    Resp 16 03/26/21 0954  SpO2 93 % 03/26/21 0954    Last Pain:  Vitals:   03/26/21 0954  TempSrc: Oral  PainSc: 0-No pain      Patients Stated Pain Goal: 8 (03/26/21 0820)  Complications: No complications documented.

## 2021-03-26 NOTE — Anesthesia Postprocedure Evaluation (Signed)
Anesthesia Post Note  Patient: Crystal Houston  Procedure(s) Performed: COLONOSCOPY WITH PROPOFOL (N/A )  Patient location during evaluation: Phase II Anesthesia Type: General Level of consciousness: awake, oriented and awake and alert Pain management: satisfactory to patient Vital Signs Assessment: post-procedure vital signs reviewed and stable Respiratory status: spontaneous breathing, respiratory function stable and nonlabored ventilation Cardiovascular status: stable Postop Assessment: no apparent nausea or vomiting Anesthetic complications: no   No complications documented.   Last Vitals:  Vitals:   03/26/21 0820 03/26/21 0954  BP: 138/84 99/61  Pulse: (!) 101   Resp: 15 16  Temp: 36.6 C 36.5 C  SpO2: 96% 93%    Last Pain:  Vitals:   03/26/21 0954  TempSrc: Oral  PainSc: 0-No pain                 Lavern Crimi

## 2021-03-31 ENCOUNTER — Encounter (HOSPITAL_COMMUNITY): Payer: Self-pay | Admitting: Internal Medicine

## 2021-04-14 ENCOUNTER — Ambulatory Visit (INDEPENDENT_AMBULATORY_CARE_PROVIDER_SITE_OTHER): Payer: 59 | Admitting: Internal Medicine

## 2021-05-11 ENCOUNTER — Other Ambulatory Visit (INDEPENDENT_AMBULATORY_CARE_PROVIDER_SITE_OTHER): Payer: Self-pay | Admitting: Internal Medicine

## 2021-05-12 ENCOUNTER — Ambulatory Visit (INDEPENDENT_AMBULATORY_CARE_PROVIDER_SITE_OTHER): Payer: 59 | Admitting: Internal Medicine

## 2021-05-12 ENCOUNTER — Encounter (INDEPENDENT_AMBULATORY_CARE_PROVIDER_SITE_OTHER): Payer: Self-pay | Admitting: Internal Medicine

## 2021-05-12 ENCOUNTER — Other Ambulatory Visit: Payer: Self-pay

## 2021-05-12 VITALS — BP 119/78 | HR 89 | Temp 97.6°F | Resp 18 | Ht 68.0 in | Wt 248.0 lb

## 2021-05-12 DIAGNOSIS — N951 Menopausal and female climacteric states: Secondary | ICD-10-CM | POA: Diagnosis not present

## 2021-05-12 DIAGNOSIS — E039 Hypothyroidism, unspecified: Secondary | ICD-10-CM

## 2021-05-12 DIAGNOSIS — F520 Hypoactive sexual desire disorder: Secondary | ICD-10-CM

## 2021-05-12 MED ORDER — NP THYROID 120 MG PO TABS: 120.0000 mg | ORAL_TABLET | Freq: Every day | ORAL | 1 refills | Status: DC

## 2021-05-12 MED ORDER — PROGESTERONE 200 MG PO CAPS: 200.0000 mg | ORAL_CAPSULE | Freq: Every day | ORAL | 3 refills | Status: DC

## 2021-05-12 NOTE — Progress Notes (Signed)
Metrics: Intervention Frequency ACO  Documented Smoking Status Yearly  Screened one or more times in 24 months  Cessation Counseling or  Active cessation medication Past 24 months  Past 24 months   Guideline developer: UpToDate (See UpToDate for funding source) Date Released: 04-04-13       Wellness Office Visit  Subjective:  Patient ID: Crystal Houston, female    DOB: [birth year]  Age: 50 y.o. MRN: [id number]  CC: This lady comes in for follow-up regarding her perimenopausal symptoms, hypothyroidism, obesity HPI  She did have blood work done but this was with [facility] and she has seen the results and she will send over the results to me. She describes irregular menstrual cycles now and also describes hot flashes and insomnia. Past Medical History:  Diagnosis Date  . Anxiety   . Depression   . OSA on CPAP 08/10/2017  . Psoriatic arthritis [facility])    Past Surgical History:  Procedure Laterality Date  . COLONOSCOPY WITH PROPOFOL N/A 03/26/2021   Procedure: COLONOSCOPY WITH PROPOFOL;  Surgeon: Lanelle Bal, DO;  [facility];  Service: Endoscopy;  Laterality: N/A;  ASA II / AM procedure  . FRACTURE SURGERY    . I & D EXTREMITY  11/10/2011   Procedure: IRRIGATION AND DEBRIDEMENT EXTREMITY;  Surgeon: Loanne Drilling;  Location: [facility] OR;  Service: Orthopedics;  Laterality: Right;  . LEFT HEART CATH AND CORONARY ANGIOGRAPHY N/A 06/18/2018   Procedure: LEFT HEART CATH AND CORONARY ANGIOGRAPHY;  Surgeon: Runell Gess, MD;  Location: [facility] INVASIVE CV LAB;  Service: Cardiovascular;  Laterality: N/A;  . ORIF ANKLE FRACTURE  11/10/2011   Procedure: OPEN REDUCTION INTERNAL FIXATION (ORIF) ANKLE FRACTURE;  Surgeon: Loanne Drilling;  Location: [facility] OR;  Service: Orthopedics;  Laterality: Right;  with screw applicaton     Family History  Problem Relation Age of Onset  . Cancer Daughter        osteosarcoma of leg  . Cancer Paternal Grandfather        colon    Social History    Social History Narrative   Daughter died in 2013/04/04 of osteosarcoma.Lives with fiancee and kids.Works for [employer] from home.   Social History   Tobacco Use  . Smoking status: Never Smoker  . Smokeless tobacco: Never Used  Substance Use Topics  . Alcohol use: Yes    Alcohol/week: 2.0 standard drinks    Types: 2 Glasses of wine per week    Comment: Very rare    Current Meds  Medication Sig  . albuterol (VENTOLIN HFA) 108 (90 Base) MCG/ACT inhaler Inhale 2 puffs into the lungs every 6 (six) hours as needed for wheezing or shortness of breath.  Marland Kitchen BIOTIN PO Take 1 tablet by mouth daily.  . Cholecalciferol (VITAMIN D3) 5000 units CAPS Take 10,000 Units by mouth daily.  . COSENTYX SENSOREADY 300 DOSE 150 MG/ML SOAJ Inject 300 mg into the skin every 28 (twenty-eight) days.   . cyclobenzaprine (FLEXERIL) 10 MG tablet Take 10 mg by mouth daily as needed (Back pain).  . folic acid (FOLVITE) 1 MG tablet Take 1 mg by mouth daily.  . methotrexate 50 MG/2ML injection Inject 50 mg/m2 into the skin every Friday. Dose is 0.6 ml  . metroNIDAZOLE (METROGEL) 1 % gel Apply 1 application topically daily as needed (Rosacea).  . naproxen (NAPROSYN) 500 MG tablet Take 500 mg by mouth 2 (two) times daily as needed (Back pain).  . Needles & Syringes (1CC TB SYRINGE)  MISC once injection  . nitroGLYCERIN (NITROSTAT) 0.4 MG SL tablet See admin instructions.  . NONFORMULARY OR COMPOUNDED ITEM Inject 10 mg into the muscle 2 (two) times a week. Testosterone cypionate in olive  oil (25 mg/mL).  Dispense 5 mL vial. (Patient taking differently: Inject 10 mg into the muscle 2 (two) times a week. Testosterone cypionate in olive  oil (25 mg/mL).  Dispense 5 mL vial.  Dose .4 mL)  . progesterone (PROMETRIUM) 200 MG capsule Take 1 capsule (200 mg total) by mouth daily.  . Secukinumab (COSENTYX SENSOREADY PEN) 150 MG/ML SOAJ Inject 150 mLs as directed as directed. Inject into skin every 4 weeks.  Marland Kitchen testosterone  cypionate (DEPOTESTOTERONE CYPIONATE) 100 MG/ML injection 1 ml  . traMADol (ULTRAM) 50 MG tablet Take 50 mg by mouth daily as needed (Back pain).  . [DISCONTINUED] NP THYROID 120 MG tablet Take 1 tablet (120 mg total) by mouth daily before breakfast.     Flowsheet Row Office Visit from 09/12/2017 in [facility]  PHQ-9 Total Score 20      Objective:   Today's Vitals: BP 119/78 (BP Location: Right Arm, Patient Position: Sitting, Cuff Size: Normal)   Pulse 89   Temp 97.6 F (36.4 C) (Temporal)   Resp 18   Ht 5\' 8"  (1.727 m)   Wt 248 lb (112.5 kg)   SpO2 99%   BMI 37.71 kg/m  Vitals with BMI 05/12/2021 03/26/2021 03/26/2021  Height 5\' 8"  - 5\' 8"   Weight 248 lbs - 244 lbs  BMI 37.72 - 37.11  Systolic 119 99 138  Diastolic 78 61 84  Pulse 89 - 03/28/2021     Physical Exam  She remains obese.  She has gained about 4 pounds.  Blood pressure is in a good range.     Assessment   1. Perimenopause   2. Hypothyroidism, adult   3. Hypoactive sexual desire disorder       Tests ordered No orders of the defined types were placed in this encounter.    Plan: 1. She will continue with all medications at the same doses but once I get the blood results, we may need to make some adjustments. 2. In the meantime, she will start progesterone at night for perimenopausal symptoms.  She is agreeable to this. 3. I will see her in September for an annual physical exam.   Meds ordered this encounter  Medications  . progesterone (PROMETRIUM) 200 MG capsule    Sig: Take 1 capsule (200 mg total) by mouth daily.    Dispense:  30 capsule    Refill:  3  . NP THYROID 120 MG tablet    Sig: Take 1 tablet (120 mg total) by mouth daily before breakfast.    Dispense:  90 tablet    Refill:  1    Neera Teng , MD

## 2021-05-13 ENCOUNTER — Encounter (INDEPENDENT_AMBULATORY_CARE_PROVIDER_SITE_OTHER): Payer: Self-pay | Admitting: Internal Medicine

## 2021-05-18 LAB — LIPID PANEL W/O CHOL/HDL RATIO
Cholesterol, Total: 196 mg/dL (ref 100–199)
HDL: 44 mg/dL (ref >39)
LDL Chol Calc (NIH): 136 mg/dL — ABNORMAL HIGH (ref 0–99)
Triglycerides: 90 mg/dL (ref 0–149)
VLDL Cholesterol Cal: 16 mg/dL (ref 5–40)

## 2021-05-18 LAB — T3 UPTAKE: T3 Uptake Ratio: 25 % (ref 24–39)

## 2021-05-18 LAB — TESTOSTERONE, FREE AND TOTAL (INCLUDES SHBG)-(MALES)
% Free Testosterone: 0.9 %
Free Testosterone, S: 5.7 pg/mL
Sex Hormone Binding Globulin: 48.5 nmol/L
Testosterone, Serum (Total): 63 ng/dL — ABNORMAL HIGH

## 2021-05-18 LAB — ESTRADIOL: Estradiol: 110 pg/mL

## 2021-05-18 LAB — FOLLICLE STIMULATING HORMONE: FSH: 3.9 m[IU]/mL

## 2021-05-18 LAB — PROGESTERONE: Progesterone: 6.7 ng/mL

## 2021-05-18 LAB — TSH: TSH: 1.49 u[IU]/mL (ref 0.450–4.500)

## 2021-08-26 ENCOUNTER — Encounter (INDEPENDENT_AMBULATORY_CARE_PROVIDER_SITE_OTHER): Payer: 59 | Admitting: Internal Medicine

## 2021-12-13 ENCOUNTER — Telehealth: Payer: Self-pay | Admitting: Urgent Care

## 2021-12-13 ENCOUNTER — Other Ambulatory Visit: Payer: Self-pay

## 2021-12-13 ENCOUNTER — Ambulatory Visit (HOSPITAL_BASED_OUTPATIENT_CLINIC_OR_DEPARTMENT_OTHER)
Admission: RE | Admit: 2021-12-13 | Discharge: 2021-12-13 | Disposition: A | Discharge status: Home / Self Care | Payer: 59 | Source: Ambulatory Visit | Attending: Urgent Care | Admitting: Urgent Care

## 2021-12-13 ENCOUNTER — Ambulatory Visit
Admission: EM | Admit: 2021-12-13 | Discharge: 2021-12-13 | Disposition: A | Discharge status: Home / Self Care | Payer: 59

## 2021-12-13 DIAGNOSIS — U071 COVID-19: Secondary | ICD-10-CM | POA: Diagnosis not present

## 2021-12-13 DIAGNOSIS — R059 Cough, unspecified: Secondary | ICD-10-CM | POA: Diagnosis present

## 2021-12-13 DIAGNOSIS — Z8616 Personal history of COVID-19: Secondary | ICD-10-CM | POA: Insufficient documentation

## 2021-12-13 DIAGNOSIS — R000 Tachycardia, unspecified: Secondary | ICD-10-CM

## 2021-12-13 DIAGNOSIS — R051 Acute cough: Secondary | ICD-10-CM | POA: Diagnosis not present

## 2021-12-13 MED ORDER — PROMETHAZINE-DM 6.25-15 MG/5ML PO SYRP: 5.0000 mL | ORAL_SOLUTION | Freq: Every evening | ORAL | 0 refills | Status: DC | PRN

## 2021-12-13 MED ORDER — BENZONATATE 100 MG PO CAPS: 100.0000 mg | ORAL_CAPSULE | Freq: Three times a day (TID) | ORAL | 0 refills | Status: DC | PRN

## 2021-12-13 MED ORDER — PREDNISONE 50 MG PO TABS: 50.0000 mg | ORAL_TABLET | Freq: Every day | ORAL | 0 refills | Status: DC

## 2021-12-13 NOTE — ED Provider Notes (Signed)
[facility]   MRN: [id number] DOB: 11/23/1971  Subjective:   Crystal Houston is a 51 y.o. female presenting for 4-day history of acute onset sinus congestion and moderate to severe cough.  Denies fevers, body aches, chest pain, shortness of breath or wheezing.  She did do an office visit virtually and was prescribed amoxicillin for a sinus infection. Had COVID 19 early December with a home positive test. Had a headache then and tested herself, also had a mild cough. She is not a smoker. Has a history of seasonal asthma as diagnosed by her pulmonologist. Had COVID 19 also ~1 year ago and had a difficult time with it.   No current facility-administered medications for this encounter.  Current Outpatient Medications:    albuterol (VENTOLIN HFA) 108 (90 Base) MCG/ACT inhaler, Inhale 2 puffs into the lungs every 6 (six) hours as needed for wheezing or shortness of breath., Disp: 3 Inhaler, Rfl: 2   amoxicillin (AMOXIL) 875 MG tablet, Take 875 mg by mouth 2 (two) times daily., Disp: , Rfl:    BIOTIN PO, Take 1 tablet by mouth daily., Disp: , Rfl:    Cholecalciferol (VITAMIN D3) 5000 units CAPS, Take 10,000 Units by mouth daily., Disp: , Rfl:    COSENTYX SENSOREADY 300 DOSE 150 MG/ML SOAJ, Inject 300 mg into the skin every 28 (twenty-eight) days. , Disp: , Rfl:    cyclobenzaprine (FLEXERIL) 10 MG tablet, Take 10 mg by mouth daily as needed (Back pain)., Disp: , Rfl:    folic acid (FOLVITE) 1 MG tablet, Take 1 mg by mouth daily., Disp: , Rfl:    isosorbide mononitrate (IMDUR) 30 MG 24 hr tablet, 1/ 2 tablet in the morning (Patient not taking: Reported on 05/12/2021), Disp: , Rfl:    methotrexate 50 MG/2ML injection, Inject 50 mg/m2 into the skin every Friday. Dose is 0.6 ml, Disp: , Rfl:    metroNIDAZOLE (METROGEL) 1 % gel, Apply 1 application topically daily as needed (Rosacea)., Disp: , Rfl:    naproxen (NAPROSYN) 500 MG tablet, Take 500 mg by mouth 2 (two) times daily as  needed (Back pain)., Disp: , Rfl:    Needles & Syringes (1CC TB SYRINGE) MISC, once injection, Disp: , Rfl:    nitroGLYCERIN (NITROSTAT) 0.4 MG SL tablet, See admin instructions., Disp: , Rfl:    NONFORMULARY OR COMPOUNDED ITEM, Inject 10 mg into the muscle 2 (two) times a week. Testosterone cypionate in olive  oil (25 mg/mL).  Dispense 5 mL vial. (Patient taking differently: Inject 10 mg into the muscle 2 (two) times a week. Testosterone cypionate in olive  oil (25 mg/mL).  Dispense 5 mL vial.  Dose .4 mL), Disp: 1 each, Rfl: 3   NP THYROID 120 MG tablet, Take 1 tablet (120 mg total) by mouth daily before breakfast., Disp: 90 tablet, Rfl: 1   progesterone (PROMETRIUM) 200 MG capsule, Take 1 capsule (200 mg total) by mouth daily., Disp: 30 capsule, Rfl: 3   Secukinumab (COSENTYX SENSOREADY PEN) 150 MG/ML SOAJ, Inject 150 mLs as directed as directed. Inject into skin 150ml every 4 weeks., Disp: , Rfl:    testosterone cypionate (DEPOTESTOTERONE CYPIONATE) 100 MG/ML injection, 1 ml, Disp: , Rfl:    traMADol (ULTRAM) 50 MG tablet, Take 50 mg by mouth daily as needed (Back pain)., Disp: , Rfl:    No Known Allergies  Past Medical History:  Diagnosis Date   Anxiety    Depression    OSA on CPAP  08/10/2017   Psoriatic arthritis (HCC)      Past Surgical History:  Procedure Laterality Date   COLONOSCOPY WITH PROPOFOL N/A 03/26/2021   Procedure: COLONOSCOPY WITH PROPOFOL;  Surgeon: Lanelle Bal, DO;  [facility];  Service: Endoscopy;  Laterality: N/A;  ASA II / AM procedure   FRACTURE SURGERY     I & D EXTREMITY  11/10/2011   Procedure: IRRIGATION AND DEBRIDEMENT EXTREMITY;  Surgeon: Loanne Drilling;  Location: [facility] OR;  Service: Orthopedics;  Laterality: Right;   LEFT HEART CATH AND CORONARY ANGIOGRAPHY N/A 06/18/2018   Procedure: LEFT HEART CATH AND CORONARY ANGIOGRAPHY;  Surgeon: Runell Gess, MD;  [facility]: [facility] INVASIVE CV LAB;  Service: Cardiovascular;  Laterality: N/A;   ORIF  ANKLE FRACTURE  11/10/2011   Procedure: OPEN REDUCTION INTERNAL FIXATION (ORIF) ANKLE FRACTURE;  Surgeon: Loanne Drilling;  Location: [facility] OR;  Service: Orthopedics;  Laterality: Right;  with screw applicaton    Family History  Problem Relation Age of Onset   Cancer Daughter        osteosarcoma of leg   Cancer Paternal Grandfather        colon    Social History   Tobacco Use   Smoking status: Never   Smokeless tobacco: Never  Vaping Use   Vaping Use: Never used  Substance Use Topics   Alcohol use: Yes    Alcohol/week: 2.0 standard drinks    Types: 2 Glasses of wine per week    Comment: Very rare   Drug use: No    ROS   Objective:   Vitals: BP 122/87 (BP Location: Left Arm)    Pulse (!) 121    Temp 98.4 F (36.9 C) (Oral)    Resp 16    SpO2 100%   Physical Exam Constitutional:      General: She is not in acute distress.    Appearance: Normal appearance. She is well-developed and normal weight. She is not ill-appearing, toxic-appearing or diaphoretic.  HENT:     Head: Normocephalic and atraumatic.     Right Ear: Tympanic membrane, ear canal and external ear normal. No drainage or tenderness. No middle ear effusion. There is no impacted cerumen. Tympanic membrane is not erythematous.     Left Ear: Tympanic membrane, ear canal and external ear normal. No drainage or tenderness.  No middle ear effusion. There is no impacted cerumen. Tympanic membrane is not erythematous.     Nose: Congestion present. No rhinorrhea.     Mouth/Throat:     Mouth: Mucous membranes are moist. No oral lesions.     Pharynx: No pharyngeal swelling, oropharyngeal exudate, posterior oropharyngeal erythema or uvula swelling.     Tonsils: No tonsillar exudate or tonsillar abscesses.     Comments: [place] postnasal drainage over her pharynx. Eyes:     General: No scleral icterus.       Right eye: No discharge.        Left eye: No discharge.     Extraocular Movements: Extraocular movements intact.      Right eye: Normal extraocular motion.     Left eye: Normal extraocular motion.     Conjunctiva/sclera: Conjunctivae normal.  Cardiovascular:     Rate and Rhythm: Normal rate and regular rhythm.     Pulses: Normal pulses.     Heart sounds: Normal heart sounds. No murmur heard.   No friction rub. No gallop.  Pulmonary:     Effort: Pulmonary effort is normal. No  respiratory distress.     Breath sounds: Normal breath sounds. No stridor. No wheezing, rhonchi or rales.  Musculoskeletal:     Cervical back: Normal range of motion and neck supple.  Lymphadenopathy:     Cervical: No cervical adenopathy.  Skin:    General: Skin is warm and dry.     Findings: No rash.  Neurological:     General: No focal deficit present.     Mental Status: She is alert and oriented to person, place, and time.  Psychiatric:        Mood and Affect: Mood normal.        Behavior: Behavior normal.        Thought Content: Thought content normal.    Assessment and Plan :   PDMP not reviewed this encounter.  1. Acute cough   2. COVID-19   3. Tachycardia    We will pursue a chest x-ray to rule out community-acquired pneumonia.  We will follow-up with those results including medications that were prescribed for it.  Counseled patient on potential for adverse effects with medications prescribed/recommended today, ER and return-to-clinic precautions discussed, patient verbalized understanding.    Wallis Bamberg, [place] 12/13/21 1206

## 2021-12-13 NOTE — Telephone Encounter (Signed)
Patient called.  Patient aware.  Recommended she finish out her amoxicillin and I will add in a steroid course, supportive care including cough suppression medications.

## 2021-12-13 NOTE — ED Triage Notes (Signed)
Pt presents for cough and congestion x 4 days. She was diagnosis with sinus infection last week via virtual visit. She is taking Amoxicillin with no relief.

## 2022-05-11 ENCOUNTER — Ambulatory Visit: Payer: Self-pay | Admitting: Family Medicine

## 2022-05-25 ENCOUNTER — Other Ambulatory Visit: Payer: Self-pay | Admitting: *Deleted

## 2022-05-25 DIAGNOSIS — M79604 Pain in right leg: Secondary | ICD-10-CM

## 2022-06-01 NOTE — Progress Notes (Signed)
Office Note     CC: Bilateral lower extremity ankle swelling, right greater than left Requesting Provider:  No ref. provider found  HPI: Crystal Houston is a 51 y.o. (20-Sep-1971) female who presents at the request of Gosrani, Nimish C, MD (Inactive) for evaluation of bilateral ankle swelling, right greater than left.  On exam today, Crystal Houston was doing well.  Her mother is a patient of mine as well.  Mother of 3, she currently works for [employer], but is able to work at home.  She is is in the dependent position for most the day.  Days and, when he appreciates some swelling in bilateral ankles.  This is with associated pain, especially when walking.  She carries a diagnosis of psoriatic arthritis and including spondylosis, for which she is treated with prednisone.  When he is also appreciated a small varicosity on the right leg with telangiectasias present on bilateral legs.  She denies significant heaviness, tired, throbbing pains.  She has no bleeding episodes or ulcerations. Crystal Houston has no history of DVT, no previous vein procedures.  Leg pain is best treated with prednisone as the pain is localized to the bilateral ankles. She has not tried compression stockings.   Past Medical History:  Diagnosis Date   Anxiety    Depression    OSA on CPAP 08/10/2017   Psoriatic arthritis [facility])     Past Surgical History:  Procedure Laterality Date   COLONOSCOPY WITH PROPOFOL N/A 03/26/2021   Procedure: COLONOSCOPY WITH PROPOFOL;  Surgeon: Lanelle Bal, DO;  Location: [facility];  Service: Endoscopy;  Laterality: N/A;  ASA II / AM procedure   FRACTURE SURGERY     I & D EXTREMITY  11/10/2011   Procedure: IRRIGATION AND DEBRIDEMENT EXTREMITY;  Surgeon: Loanne Drilling;  Location: [facility] OR;  Service: Orthopedics;  Laterality: Right;   LEFT HEART CATH AND CORONARY ANGIOGRAPHY N/A 06/18/2018   Procedure: LEFT HEART CATH AND CORONARY ANGIOGRAPHY;  Surgeon: Runell Gess, MD;  Location: [facility] INVASIVE CV LAB;   Service: Cardiovascular;  Laterality: N/A;   ORIF ANKLE FRACTURE  11/10/2011   Procedure: OPEN REDUCTION INTERNAL FIXATION (ORIF) ANKLE FRACTURE;  Surgeon: Loanne Drilling;  Location: [facility] OR;  Service: Orthopedics;  Laterality: Right;  with screw applicaton    Social History   Socioeconomic History   Marital status: Significant Other    Spouse name: Not on file   Number of children: 3   Years of education: Not on file   Highest education level: Not on file  Occupational History   Occupation: Building control surveyor: [employer]  Tobacco Use   Smoking status: Never   Smokeless tobacco: Never  Vaping Use   Vaping Use: Never used  Substance and Sexual Activity   Alcohol use: Yes    Alcohol/week: 2.0 standard drinks of alcohol    Types: 2 Glasses of wine per week    Comment: Very rare   Drug use: No   Sexual activity: Never    Birth control/protection: I.U.D.  Other Topics Concern   Not on file  Social History Narrative   Daughter died in 03-31-13 of osteosarcoma.Lives with fiancee and kids.Works for [employer] from home.   Social Determinants of Health   Financial Resource Strain: Not on file  Food Insecurity: Not on file  Transportation Needs: Not on file  Physical Activity: Not on file  Stress: Not on file  Social Connections: Not on file  Intimate Partner Violence: Not  on file   Family History  Problem Relation Age of Onset   Cancer Daughter        osteosarcoma of leg   Cancer Paternal Grandfather        colon    Current Outpatient Medications  Medication Sig Dispense Refill   albuterol (VENTOLIN HFA) 108 (90 Base) MCG/ACT inhaler Inhale 2 puffs into the lungs every 6 (six) hours as needed for wheezing or shortness of breath. 3 Inhaler 2   amoxicillin (AMOXIL) 875 MG tablet Take 875 mg by mouth 2 (two) times daily.     benzonatate (TESSALON) 100 MG capsule Take 1-2 capsules (100-200 mg total) by mouth 3 (three) times daily as needed for cough. 60 capsule 0   BIOTIN PO Take  1 tablet by mouth daily.     Cholecalciferol (VITAMIN D3) 5000 units CAPS Take 10,000 Units by mouth daily.     COSENTYX SENSOREADY 300 DOSE 150 MG/ML SOAJ Inject 300 mg into the skin every 28 (twenty-eight) days.      cyclobenzaprine (FLEXERIL) 10 MG tablet Take 10 mg by mouth daily as needed (Back pain).     folic acid (FOLVITE) 1 MG tablet Take 1 mg by mouth daily.     isosorbide mononitrate (IMDUR) 30 MG 24 hr tablet 1/ 2 tablet in the morning (Patient not taking: Reported on 05/12/2021)     methotrexate 50 MG/2ML injection Inject 50 mg/m2 into the skin every Friday. Dose is 0.6 ml     metroNIDAZOLE (METROGEL) 1 % gel Apply 1 application topically daily as needed (Rosacea).     naproxen (NAPROSYN) 500 MG tablet Take 500 mg by mouth 2 (two) times daily as needed (Back pain).     Needles & Syringes (1CC TB SYRINGE) MISC once injection     nitroGLYCERIN (NITROSTAT) 0.4 MG SL tablet See admin instructions.     NONFORMULARY OR COMPOUNDED ITEM Inject 10 mg into the muscle 2 (two) times a week. Testosterone cypionate in olive  oil (25 mg/mL).  Dispense 5 mL vial. (Patient taking differently: Inject 10 mg into the muscle 2 (two) times a week. Testosterone cypionate in olive  oil (25 mg/mL).  Dispense 5 mL vial.  Dose .4 mL) 1 each 3   NP THYROID 120 MG tablet Take 1 tablet (120 mg total) by mouth daily before breakfast. 90 tablet 1   predniSONE (DELTASONE) 50 MG tablet Take 1 tablet (50 mg total) by mouth daily with breakfast. 5 tablet 0   progesterone (PROMETRIUM) 200 MG capsule Take 1 capsule (200 mg total) by mouth daily. 30 capsule 3   promethazine-dextromethorphan (PROMETHAZINE-DM) 6.25-15 MG/5ML syrup Take 5 mLs by mouth at bedtime as needed for cough. 100 mL 0   Secukinumab (COSENTYX SENSOREADY PEN) 150 MG/ML SOAJ Inject 150 mLs as directed as directed. Inject into skin every 4 weeks.     testosterone cypionate (DEPOTESTOTERONE CYPIONATE) 100 MG/ML injection 1 ml     traMADol (ULTRAM) 50  MG tablet Take 50 mg by mouth daily as needed (Back pain).     No current facility-administered medications for this visit.    No Known Allergies   REVIEW OF SYSTEMS:  [X]  denotes positive finding, [ ]  denotes negative finding Cardiac  Comments:  Chest pain or chest pressure:    Shortness of breath upon exertion:    Short of breath when lying flat:    Irregular heart rhythm:        Vascular    Pain in calf,  thigh, or hip brought on by ambulation:    Pain in feet at night that wakes you up from your sleep:     Blood clot in your veins:    Leg swelling:         Pulmonary    Oxygen at home:    Productive cough:     Wheezing:         Neurologic    Sudden weakness in arms or legs:     Sudden numbness in arms or legs:     Sudden onset of difficulty speaking or slurred speech:    Temporary loss of vision in one eye:     Problems with dizziness:         Gastrointestinal    Blood in stool:     Vomited blood:         Genitourinary    Burning when urinating:     Blood in urine:        Psychiatric    Major depression:         Hematologic    Bleeding problems:    Problems with blood clotting too easily:        Skin    Rashes or ulcers:        Constitutional    Fever or chills:      PHYSICAL EXAMINATION:  There were no vitals filed for this visit.  General:  WDWN in NAD; vital signs documented above, obese Gait: Not observed HENT: WNL, normocephalic Pulmonary: normal non-labored breathing , without Rales, rhonchi,  wheezing Cardiac: regular HR,  Abdomen: soft, NT, no masses Skin: without rashes Vascular Exam/Pulses:  Right Left  Radial 2+ (normal) 2+ (normal)  Ulnar 2+ (normal) 2+ (normal)  Femoral    Popliteal    DP 2+ (normal) 2+ (normal)  PT 2+ (normal) 2+ (normal)   Extremities: without ischemic changes, without Gangrene , without cellulitis; without open wounds;  Musculoskeletal: no muscle wasting or atrophy  Neurologic: A&O X 3;  No focal weakness  or paresthesias are detected Psychiatric:  The pt has Normal affect.   Non-Invasive Vascular Imaging:   Venous Reflux Times  +--------------+---------+------+-----------+------------+-----------------  ----+  RIGHT         Reflux NoRefluxReflux TimeDiameter cmsComments                                        Yes                                                 +--------------+---------+------+-----------+------------+-----------------  ----+  CFV           no                                                            +--------------+---------+------+-----------+------------+-----------------  ----+  FV mid        no                                                            +--------------+---------+------+-----------+------------+-----------------  ----+  Popliteal     no                                                            +--------------+---------+------+-----------+------------+-----------------  ----+  GSV at [facility]              yes    >500 ms      0.76                            +--------------+---------+------+-----------+------------+-----------------  ----+  GSV prox thigh          yes    >500 ms      0.57                            +--------------+---------+------+-----------+------------+-----------------  ----+  GSV mid thigh           yes    >500 ms      0.52    several  varicosities                                                       branching from  GSV     +--------------+---------+------+-----------+------------+-----------------  ----+  GSV dist thigh          yes    >500 ms      0.35                            +--------------+---------+------+-----------+------------+-----------------  ----+  GSV at knee             yes    >500 ms      0.32                            +--------------+---------+------+-----------+------------+-----------------  ----+  GSV prox calf no                             0.39                            +--------------+---------+------+-----------+------------+-----------------  ----+  SSV Pop Fossa no                            0.47                            +--------------+---------+------+-----------+------------+-----------------  ----+  SSV prox calf no                            0.27                            +--------------+---------+------+-----------+------------+-----------------  ----+  SSV mid calf            yes    >  500 ms      0.30                            +--------------+---------+------+-----------+------------+-----------------  ----+     ASSESSMENT/PLAN:: 51 y.o. female presenting with telangiectasias on bilateral lower extremities, and a small varicosity appreciated at the medial aspect of the right knee.  Right lower extremity venous reflux ultrasound was reviewed demonstrating venous insufficiency within the greater saphenous vein.  Deep system intact.  At this time, Crystal Houston has mild symptoms, citing swelling localized to the ankles.  She is unsure as to whether this is from her psoriatic arthritis versus venous disease.  The prednisone injections for psoriatic arthritis helps significantly. Biggest fear is that she does not want to have the same large varicosities her mother has.  I had a long discussion with Crystal Houston regarding the above, as well as the natural history of chronic venous insufficiency.  Currently CEAP1, with only mild symptoms.  Venous ablation is reserved for patients with more pronounced symptoms.  Crystal Houston and I discussed the importance of compression stockings to alleviate her swelling.  She was most interested in sclerotherapy, for which she was given further information as this is an in office procedure.  She is aware that more telangiectasias will arise over the course of her lifetime, and the treatment is not permanent.  I asked Crystal Houston to return to the office in 3 months  to discuss improvement in symptoms from compression stockings.   Should these worsen, we can discuss radiofrequency ablation.   Victorino Sparrow, MD [facility] [phone]

## 2022-06-03 ENCOUNTER — Encounter: Payer: Self-pay | Admitting: Vascular Surgery

## 2022-06-03 ENCOUNTER — Ambulatory Visit: Payer: 59 | Admitting: Vascular Surgery

## 2022-06-03 ENCOUNTER — Ambulatory Visit (HOSPITAL_COMMUNITY)
Admission: RE | Admit: 2022-06-03 | Discharge: 2022-06-03 | Disposition: A | Discharge status: Home / Self Care | Payer: 59 | Source: Ambulatory Visit | Attending: Vascular Surgery | Admitting: Vascular Surgery

## 2022-06-03 VITALS — BP 129/79 | HR 85 | Temp 98.2°F | Resp 14 | Ht 68.0 in | Wt 257.0 lb

## 2022-06-03 DIAGNOSIS — M79604 Pain in right leg: Secondary | ICD-10-CM

## 2022-06-03 DIAGNOSIS — M7989 Other specified soft tissue disorders: Secondary | ICD-10-CM

## 2022-08-23 ENCOUNTER — Ambulatory Visit (INDEPENDENT_AMBULATORY_CARE_PROVIDER_SITE_OTHER): Payer: 59 | Admitting: Family

## 2022-08-23 ENCOUNTER — Encounter: Payer: Self-pay | Admitting: Family

## 2022-08-23 VITALS — BP 122/72 | HR 83 | Temp 98.6°F | Resp 16 | Ht 68.0 in | Wt 259.0 lb

## 2022-08-23 DIAGNOSIS — Z8679 Personal history of other diseases of the circulatory system: Secondary | ICD-10-CM | POA: Insufficient documentation

## 2022-08-23 DIAGNOSIS — Z6839 Body mass index (BMI) 39.0-39.9, adult: Secondary | ICD-10-CM

## 2022-08-23 DIAGNOSIS — M450 Ankylosing spondylitis of multiple sites in spine: Secondary | ICD-10-CM | POA: Insufficient documentation

## 2022-08-23 DIAGNOSIS — Z7689 Persons encountering health services in other specified circumstances: Secondary | ICD-10-CM

## 2022-08-23 MED ORDER — THYROID 30 MG PO TABS: 30.0000 mg | ORAL_TABLET | Freq: Every day | ORAL | 3 refills | Status: DC

## 2022-08-23 NOTE — Assessment & Plan Note (Signed)
Discussed with patient that with some of her concerns and with past history of noncompliance that I may not necessarily be the best provider that would give her the outcome that she is looking for.  I did have a long discussion with her about taking thyroid medication for weight loss when thyroid disease was not evident,  which may result in longer term side effects and poor outcomes for her as well as not wishing to prescribe her phentermine based on her cardiology history as it may increase her risk of a heart attack or stroke if she were to start on this.  I did recommend that she might be more suited for a fundamental doctor. patient agreed, and we mutually agreed to not continue forward as establishing care with one another today.

## 2022-08-23 NOTE — Progress Notes (Signed)
New Patient Office Visit  Subjective:  Patient ID: Crystal Houston, female    DOB: 1971/07/23  Age: 51 y.o. MRN: [id number]  CC:  Chief Complaint  Patient presents with   Establish Care    HPI Crystal Houston is here to establish care as a new patient.  Prior provider was: Dr. Ruthe Mannan  Pt is without acute concerns.   Has testosterone pellet in place, inserted by [facility], for weight loss goal. She also was started on NP thyroid 30 mg once daily, without a diagnosis of hypothyroid, for purposes of weight loss. She is asking if I would consider giving her a RX of phentermine which she has been on in the past. When asked if she exercises she states it is hard for her to do so due to her chronic back pain, and arthritis pain. She was on progesterone in the recent past, is on no longer.  Rheum every three months.  [facility] , sees them weekly.   chronic concerns:  H/o coronary vasospasm: back in 2019 seen by Dr. Allyson Sabal cardiology started on low dose imdur 15 mg which she states she is no longer taking. Read last note from cardiology stated she was to continue on this to reduce risk of repeat coronary vasospasm.   Sleep apnea: pt states not wearing CPAP because she 'wasn't told to'. Read last note from pulmonary in 2019 stating pt noncompliance with wearing mass. At night. Pt reports fatigue but states 'its only because of my psoriatic arthritis'  Past Medical History:  Diagnosis Date   Anxiety    Depression    OSA on CPAP 08/10/2017   Psoriatic arthritis (HCC)     Past Surgical History:  Procedure Laterality Date   COLONOSCOPY WITH PROPOFOL N/A 03/26/2021   Procedure: COLONOSCOPY WITH PROPOFOL;  Surgeon: Lanelle Bal, DO;  [facility];  Service: Endoscopy;  Laterality: N/A;  ASA II / AM procedure   FRACTURE SURGERY     I & D EXTREMITY  11/10/2011   Procedure: IRRIGATION AND DEBRIDEMENT EXTREMITY;  Surgeon: Loanne Drilling;  Location: [facility] OR;  Service:  Orthopedics;  Laterality: Right;   LEFT HEART CATH AND CORONARY ANGIOGRAPHY N/A 06/18/2018   Procedure: LEFT HEART CATH AND CORONARY ANGIOGRAPHY;  Surgeon: Runell Gess, MD;  Location: [facility] INVAS[facility] CV LAB;  Service: Cardiovascular;  Laterality: N/A;   ORIF ANKLE FRACTURE  11/10/2011   Procedure: OPEN REDUCTION INTERNAL FIXATION (ORIF) ANKLE FRACTURE;  Surgeon: Loanne Drilling;  Location: [facility] OR;  Service: Orthopedics;  Laterality: Right;  with screw applicaton    Family History  Problem Relation Age of Onset   Stroke Mother    Hearing loss Father    Cancer Daughter        osteosarcoma of leg   Stroke Maternal Grandmother    Dementia Maternal Grandmother    Lymphoma Maternal Grandfather    Diabetes Paternal Grandmother    Hypertension Paternal Grandmother    Dementia Paternal Grandmother    Cancer Paternal Grandfather        colon    Social History   Socioeconomic History   Marital status: Significant Other    Spouse name: Not on file   Number of children: 3   Years of education: Not on file   Highest education level: Not on file  Occupational History   Occupation: Building control surveyor: [employer]  Tobacco Use   Smoking status: Never  Smokeless tobacco: Never  Vaping Use   Vaping Use: Never used  Substance and Sexual Activity   Alcohol use: Yes    Alcohol/week: 2.0 standard drinks of alcohol    Types: 2 Glasses of wine per week    Comment: Very rare   Drug use: No   Sexual activity: Yes  Other Topics Concern   Not on file  Social History Narrative   Daughter died in 2013/03/24 of osteosarcoma.Lives with fiancee and kids.Works for [employer] from home.   Social Determinants of Health   Financial Resource Strain: Not on file  Food Insecurity: Not on file  Transportation Needs: Not on file  Physical Activity: Not on file  Stress: Not on file  Social Connections: Not on file  Intimate Partner Violence: Not on file    Outpatient Medications Prior to Visit  Medication  Sig Dispense Refill   albuterol (VENTOLIN HFA) 108 (90 Base) MCG/ACT inhaler Inhale 2 puffs into the lungs every 6 (six) hours as needed for wheezing or shortness of breath. 3 Inhaler 2   BIOTIN PO Take 1 tablet by mouth daily.     Cholecalciferol (VITAMIN D3) 5000 units CAPS Take 10,000 Units by mouth daily.     cyclobenzaprine (FLEXERIL) 10 MG tablet Take 10 mg by mouth daily as needed (Back pain).     folic acid (FOLVITE) 1 MG tablet Take 1 mg by mouth daily.     methotrexate 50 MG/2ML injection Inject 50 mg/m2 into the skin every Friday. Dose is 0.6 ml     metroNIDAZOLE (METROGEL) 1 % gel Apply 1 application topically daily as needed (Rosacea).     naproxen (NAPROSYN) 500 MG tablet Take 500 mg by mouth 2 (two) times daily as needed (Back pain).     Needles & Syringes (1CC TB SYRINGE) MISC once injection     nitroGLYCERIN (NITROSTAT) 0.4 MG SL tablet See admin instructions.     predniSONE (DELTASONE) 50 MG tablet Take 1 tablet (50 mg total) by mouth daily with breakfast. 5 tablet 0   traMADol (ULTRAM) 50 MG tablet Take 50 mg by mouth daily as needed (Back pain).     isosorbide mononitrate (IMDUR) 30 MG 24 hr tablet      amoxicillin (AMOXIL) 875 MG tablet Take 875 mg by mouth 2 (two) times daily.     benzonatate (TESSALON) 100 MG capsule Take 1-2 capsules (100-200 mg total) by mouth 3 (three) times daily as needed for cough. 60 capsule 0   COSENTYX SENSOREADY 300 DOSE 150 MG/ML SOAJ Inject 300 mg into the skin every 28 (twenty-eight) days.      NONFORMULARY OR COMPOUNDED ITEM Inject 10 mg into the muscle 2 (two) times a week. Testosterone cypionate in olive  oil (25 mg/mL).  Dispense 5 mL vial. (Patient taking differently: Inject 10 mg into the muscle 2 (two) times a week. Testosterone cypionate in olive  oil (25 mg/mL).  Dispense 5 mL vial.  Dose .4 mL) 1 each 3   NP THYROID 120 MG tablet Take 1 tablet (120 mg total) by mouth daily before breakfast. 90 tablet 1   progesterone (PROMETRIUM) 200  MG capsule Take 1 capsule (200 mg total) by mouth daily. 30 capsule 3   promethazine-dextromethorphan (PROMETHAZINE-DM) 6.25-15 MG/5ML syrup Take 5 mLs by mouth at bedtime as needed for cough. 100 mL 0   Secukinumab (COSENTYX SENSOREADY PEN) 150 MG/ML SOAJ Inject 150 mLs as directed as directed. Inject into skin every 4 weeks.  testosterone cypionate (DEPOTESTOTERONE CYPIONATE) 100 MG/ML injection 1 ml (Patient not taking: Reported on 08/23/2022)     No facility-administered medications prior to visit.    No Known Allergies      Objective:    BP 122/72   Pulse 83   Temp 98.6 F (37 C)   Resp 16   Ht 5\' 8"  (1.727 m)   Wt 259 lb (117.5 kg)   SpO2 97%   BMI 39.38 kg/m  Wt Readings from Last 3 Encounters:  08/23/22 259 lb (117.5 kg)  06/03/22 257 lb (116.6 kg)  05/12/21 248 lb (112.5 kg)     Health Maintenance Due  Topic Date Due   Hepatitis C Screening  Never done   COVID-19 Vaccine (3 - Pfizer series) 04/24/2020   Zoster Vaccines- Shingrix (1 of 2) Never done   MAMMOGRAM  03/19/2021   INFLUENZA VACCINE  07/05/2022   TETANUS/TDAP  08/22/2022    There are no preventive care reminders to display for this patient.  Lab Results  Component Value Date   TSH 1.490 05/11/2021   Lab Results  Component Value Date   WBC 6.2 06/15/2018   HGB 14.8 06/15/2018   HCT 42.5 06/15/2018   MCV 86 06/15/2018   PLT 371 06/15/2018   Lab Results  Component Value Date   NA 138 06/15/2018   K 5.3 (H) 06/15/2018   CO2 23 06/15/2018   GLUCOSE 84 06/15/2018   BUN 11 06/15/2018   CREATININE 0.85 06/15/2018   BILITOT 0.6 06/15/2018   ALKPHOS 76 06/15/2018   AST 12 06/15/2018   ALT 18 06/15/2018   PROT 6.8 06/15/2018   ALBUMIN 4.0 06/15/2018   CALCIUM 8.9 06/15/2018   ANIONGAP 8 06/11/2018   Lab Results  Component Value Date   CHOL 196 05/11/2021   Lab Results  Component Value Date   HDL 44 05/11/2021   Lab Results  Component Value Date   LDLCALC 136 (H)  05/11/2021   Lab Results  Component Value Date   TRIG 90 05/11/2021   Lab Results  Component Value Date   CHOLHDL 4.6 (H) 08/10/2017   No results found for: "HGBA1C"    Assessment & Plan:   Problem List Items Addressed This Visit       Musculoskeletal and Integument   Ankylosing spondylitis of multiple sites in spine (HCC)     Other   Obesity - Primary   Relevant Medications   thyroid (NP THYROID) 30 MG tablet   History of coronary vasospasm   Encounter to establish care    Discussed with patient that with some of her concerns and with past history of noncompliance that I may not necessarily be the best provider that would give her the outcome that she is looking for.  I did have a long discussion with her about taking thyroid medication for weight loss when thyroid disease was not evident,  which may result in longer term side effects and poor outcomes for her as well as not wishing to prescribe her phentermine based on her cardiology history as it may increase her risk of a heart attack or stroke if she were to start on this.  I did recommend that she might be more suited for a fundamental doctor. patient agreed, and we mutually agreed to not continue forward as establishing care with one another today.       Meds ordered this encounter  Medications   thyroid (NP THYROID) 30 MG tablet  Sig: Take 1 tablet (30 mg total) by mouth daily before breakfast.    Dispense:  90 tablet    Refill:  3    Order Specific Question:   Supervising Provider    Answer:   BEDSOLE, AMY E [[id number]    Follow-up: No follow-ups on file.    Eugenia Pancoast, FNP

## 2022-08-24 ENCOUNTER — Encounter: Payer: Self-pay | Admitting: Internal Medicine

## 2022-08-24 ENCOUNTER — Ambulatory Visit: Payer: 59 | Admitting: Internal Medicine

## 2022-08-24 VITALS — BP 126/80 | HR 93 | Ht 68.0 in | Wt 257.0 lb

## 2022-08-24 DIAGNOSIS — J45998 Other asthma: Secondary | ICD-10-CM

## 2022-08-24 DIAGNOSIS — M450 Ankylosing spondylitis of multiple sites in spine: Secondary | ICD-10-CM

## 2022-08-24 DIAGNOSIS — Z0000 Encounter for general adult medical examination without abnormal findings: Secondary | ICD-10-CM | POA: Diagnosis not present

## 2022-08-24 DIAGNOSIS — Z6837 Body mass index (BMI) 37.0-37.9, adult: Secondary | ICD-10-CM

## 2022-08-24 DIAGNOSIS — Z0001 Encounter for general adult medical examination with abnormal findings: Secondary | ICD-10-CM

## 2022-08-24 DIAGNOSIS — E559 Vitamin D deficiency, unspecified: Secondary | ICD-10-CM | POA: Diagnosis not present

## 2022-08-24 DIAGNOSIS — F418 Other specified anxiety disorders: Secondary | ICD-10-CM | POA: Diagnosis not present

## 2022-08-24 DIAGNOSIS — L4050 Arthropathic psoriasis, unspecified: Secondary | ICD-10-CM

## 2022-08-24 DIAGNOSIS — E66812 Obesity, class 2: Secondary | ICD-10-CM

## 2022-08-24 DIAGNOSIS — L719 Rosacea, unspecified: Secondary | ICD-10-CM

## 2022-08-24 DIAGNOSIS — Z2821 Immunization not carried out because of patient refusal: Secondary | ICD-10-CM

## 2022-08-24 DIAGNOSIS — G4733 Obstructive sleep apnea (adult) (pediatric): Secondary | ICD-10-CM

## 2022-08-24 DIAGNOSIS — Z6839 Body mass index (BMI) 39.0-39.9, adult: Secondary | ICD-10-CM

## 2022-08-24 DIAGNOSIS — Z23 Encounter for immunization: Secondary | ICD-10-CM | POA: Diagnosis not present

## 2022-08-24 DIAGNOSIS — E6609 Other obesity due to excess calories: Secondary | ICD-10-CM

## 2022-08-24 NOTE — Assessment & Plan Note (Signed)
BMI 39.  She has most recently been followed by Lyn Henri MD and treated with NP thyroid and a testosterone pellet for weight loss.  She expresses concerns over continuing these therapies because of potential side effects.  She remains interested in losing weight but does not want to do so at the expense of her long-term health. -I expressed my concern over continued use of NP thyroid, testosterone, or phentermine for weight loss.  She is in agreement as she has recently learned of the potential negative long-term side effects.  We will plan for follow-up in 1-2 months to discuss starting GLP-1 agonist therapy.  I have recommended that she research these medications in the interim.

## 2022-08-24 NOTE — Assessment & Plan Note (Signed)
Not currently on any antidepressant treatment.  Reports stability in her mood today.  PHQ-2 score 0

## 2022-08-24 NOTE — Assessment & Plan Note (Signed)
Presenting today to establish care. -Basic labs ordered today, including one-time HCV screening -Flu vaccine administered today -Tdap/Shingrix deferred to follow-up -She is scheduled for a mammogram tomorrow (9/21) -Pap smears are completed by her OB/GYN ([place]) and are up-to-date

## 2022-08-24 NOTE — Patient Instructions (Signed)
It was a pleasure to see you today.  Thank you for giving [place] the opportunity to be involved in your care.  Below is a brief recap of your visit and next steps.  We will plan to see you again in 4 weeks  Summary We will check basic labs today You will receive your flu shot today  Next steps Follow up in 4 weeks to discuss weight loss strategies. I would recommend researching GLP-1 receptor agonists

## 2022-08-24 NOTE — Assessment & Plan Note (Signed)
Mild OSA with AHI 5.2 noted on PSG performed August 2018.  She was previously treated with CPAP, but stopped using after 2 years of use due to inefficacy.  She was also reportedly told that her sleep apnea is "not that bad" and that she did not need to continue to wear it. -I expressed my concern today of her untreated OSA.  We will further discuss this at follow-up and she will likely need at least a Pap titration in order to get a new CPAP machine/supplies.

## 2022-08-24 NOTE — Assessment & Plan Note (Signed)
Followed by dermatology.  Has MetroGel for as needed use.

## 2022-08-24 NOTE — Assessment & Plan Note (Signed)
Followed by rheumatology.  She is prescribed Rinvoq.  She also takes naproxen as needed for pain relief.  She has establish care with Raliegh Ip for management of chronic back pain and receives periodic steroid injections.  She also takes Flexeril as needed for pain relief.  No changes today.

## 2022-08-24 NOTE — Assessment & Plan Note (Signed)
Currently on vitamin D supplementation.  She reports a history of difficulty achieving an adequate vitamin D level.  Repeat vitamin D level ordered today.

## 2022-08-24 NOTE — Assessment & Plan Note (Signed)
Followed by rheumatology and dermatology.  She is prescribed methotrexate.  She is on folate supplementation.  She uses fluocinonide as needed for psoriasis flares.  No changes today.

## 2022-08-24 NOTE — Assessment & Plan Note (Signed)
Has an albuterol inhaler for as needed use but states that she has not needed to use her inhaler recently.

## 2022-08-24 NOTE — Progress Notes (Signed)
New Patient Office Visit  Subjective    Patient ID: Crystal Houston, female    DOB: 1971/07/13  Age: 51 y.o. MRN: [id number]  CC:  Chief Complaint  Patient presents with   Establish Care   HPI Crystal Houston presents to establish care.  She is a 51 year old woman with a past medical history significant for psoriatic arthritis, ankylosing spondylitis, chronic MSK pain, seasonal asthma, OSA, obesity, and rosacea.  She was previously followed by Dr. Karilyn Cota and recently attempted to establish care with a provider at [facility] however it was determined that they would not be a good fit.  She is also followed by [facility] for chronic back pain and follows with a provider at [facility] in [place] for treatment of obesity.  [name] states that she feels well today.  She is asymptomatic and denies acute concerns.  Chronic medical conditions and outstanding preventative healthcare maintenance items discussed today individually addressed in A/P below.   Outpatient Encounter Medications as of 08/24/2022  Medication Sig   albuterol (VENTOLIN HFA) 108 (90 Base) MCG/ACT inhaler Inhale 2 puffs into the lungs every 6 (six) hours as needed for wheezing or shortness of breath.   BIOTIN PO Take 1 tablet by mouth daily.   Cholecalciferol (VITAMIN D3) 5000 units CAPS Take 10,000 Units by mouth daily.   cyclobenzaprine (FLEXERIL) 10 MG tablet Take 10 mg by mouth daily as needed (Back pain).   folic acid (FOLVITE) 1 MG tablet Take 1 mg by mouth daily.   methotrexate 50 MG/2ML injection Inject 50 mg/m2 into the skin every Friday. Dose is 0.6 ml   metroNIDAZOLE (METROGEL) 1 % gel Apply 1 application topically daily as needed (Rosacea).   naproxen (NAPROSYN) 500 MG tablet Take 500 mg by mouth 2 (two) times daily as needed (Back pain).   Needles & Syringes (1CC TB SYRINGE) MISC once injection   nitroGLYCERIN (NITROSTAT) 0.4 MG SL tablet See admin  instructions.   thyroid (NP THYROID) 30 MG tablet Take 1 tablet (30 mg total) by mouth daily before breakfast.   [DISCONTINUED] predniSONE (DELTASONE) 50 MG tablet Take 1 tablet (50 mg total) by mouth daily with breakfast.   [DISCONTINUED] traMADol (ULTRAM) 50 MG tablet Take 50 mg by mouth daily as needed (Back pain).   No facility-administered encounter medications on file as of 08/24/2022.    Past Medical History:  Diagnosis Date   Anxiety    Depression    OSA on CPAP 08/10/2017   Psoriatic arthritis [facility])     Past Surgical History:  Procedure Laterality Date   COLONOSCOPY WITH PROPOFOL N/A 03/26/2021   Procedure: COLONOSCOPY WITH PROPOFOL;  Surgeon: Lanelle Bal, DO;  Location: [facility] END[facility]ITE;  Service: Endoscopy;  Laterality: N/A;  ASA II / AM procedure   FRACTURE SURGERY     I & D EXTREMITY  11/10/2011   Procedure: IRRIGATION AND DEBRIDEMENT EXTREMITY;  Surgeon: Loanne Drilling;  Location: [facility] OR;  Service: Orthopedics;  Laterality: Right;   LEFT HEART CATH AND CORONARY ANGIOGRAPHY N/A 06/18/2018   Procedure: LEFT HEART CATH AND CORONARY ANGIOGRAPHY;  Surgeon: Runell Gess, MD;  Location: [facility] INVASIVE CV LAB;  Service: Cardiovascular;  Laterality: N/A;   ORIF ANKLE FRACTURE  11/10/2011   Procedure: OPEN REDUCTION INTERNAL FIXATION (ORIF) ANKLE FRACTURE;  Surgeon: Loanne Drilling;  Location: [facility] OR;  Service: Orthopedics;  Laterality: Right;  with screw applicaton    Family History  Problem Relation Age of Onset   Stroke Mother    Hearing loss Father    Cancer Daughter        osteosarcoma of leg   Stroke Maternal Grandmother    Dementia Maternal Grandmother    Lymphoma Maternal Grandfather    Diabetes Paternal Grandmother    Hypertension Paternal Grandmother    Dementia Paternal Grandmother    Cancer Paternal Grandfather        colon    Social History   Socioeconomic History   Marital status: Significant Other    Spouse name: Not on file   Number of children: 3    Years of education: Not on file   Highest education level: Not on file  Occupational History   Occupation: Building control surveyor: [employer]  Tobacco Use   Smoking status: Never   Smokeless tobacco: Never  Vaping Use   Vaping Use: Never used  Substance and Sexual Activity   Alcohol use: Yes    Alcohol/week: 2.0 standard drinks of alcohol    Types: 2 Glasses of wine per week    Comment: Very rare   Drug use: No   Sexual activity: Yes  Other Topics Concern   Not on file  Social History Narrative   Daughter died in 04-19-13 of osteosarcoma.Lives with fiancee and kids.Works for [employer] from home.   Social Determinants of Health   Financial Resource Strain: Not on file  Food Insecurity: Not on file  Transportation Needs: Not on file  Physical Activity: Not on file  Stress: Not on file  Social Connections: Not on file  Intimate Partner Violence: Not on file    Review of Systems  Constitutional:  Negative for chills and fever.  HENT:  Negative for sore throat.   Respiratory:  Negative for cough and shortness of breath.   Cardiovascular:  Negative for chest pain, palpitations and leg swelling.  Gastrointestinal:  Negative for abdominal pain, blood in stool, constipation, diarrhea, nausea and vomiting.  Genitourinary:  Negative for dysuria and hematuria.  Musculoskeletal:  Negative for myalgias.  Skin:  Negative for itching and rash.  Neurological:  Negative for dizziness and headaches.  Psychiatric/Behavioral:  Negative for depression and suicidal ideas.         Objective    BP 126/80   Pulse 93   Ht 5\' 8"  (1.727 m)   Wt 257 lb (116.6 kg)   SpO2 98%   BMI 39.08 kg/m   Physical Exam Constitutional:      General: She is not in acute distress.    Appearance: Normal appearance. She is obese. She is not toxic-appearing.  HENT:     Head: Normocephalic and atraumatic.     Right Ear: External ear normal.     Left Ear: External ear normal.     Nose: Nose normal. No  congestion or rhinorrhea.     Mouth/Throat:     Mouth: Mucous membranes are moist.     Pharynx: Oropharynx is clear. No oropharyngeal exudate or posterior oropharyngeal erythema.  Eyes:     General: No scleral icterus.    Extraocular Movements: Extraocular movements intact.     Conjunctiva/sclera: Conjunctivae normal.     Pupils: Pupils are equal, round, and reactive to light.  Cardiovascular:     Rate and Rhythm: Normal rate and regular rhythm.     Pulses: Normal pulses.     Heart sounds: Normal heart sounds. No murmur heard.    No friction rub.  No gallop.  Pulmonary:     Effort: Pulmonary effort is normal.     Breath sounds: Normal breath sounds. No wheezing, rhonchi or rales.  Abdominal:     General: Abdomen is flat. Bowel sounds are normal. There is no distension.     Palpations: Abdomen is soft.     Tenderness: There is no abdominal tenderness.  Musculoskeletal:        General: Normal range of motion.     Cervical back: Normal range of motion.     Comments: Nonpitting bilateral lower extremity edema  Lymphadenopathy:     Cervical: No cervical adenopathy.  Skin:    General: Skin is warm and dry.     Capillary Refill: Capillary refill takes less than 2 seconds.     Coloration: Skin is not jaundiced.  Neurological:     General: No focal deficit present.     Mental Status: She is oriented to person, place, and time.  Psychiatric:        Mood and Affect: Mood normal.        Behavior: Behavior normal.    Assessment & Plan:   Problem List Items Addressed This Visit       Respiratory   Seasonal asthma    Has an albuterol inhaler for as needed use but states that she has not needed to use her inhaler recently.      Obstructive sleep apnea    Mild OSA with AHI 5.2 noted on PSG performed August 2018.  She was previously treated with CPAP, but stopped using after 2 years of use due to inefficacy.  She was also reportedly told that her sleep apnea is "not that bad" and that  she did not need to continue to wear it. -I expressed my concern today of her untreated OSA.  We will further discuss this at follow-up and she will likely need at least a Pap titration in order to get a new CPAP machine/supplies.        Musculoskeletal and Integument   Psoriatic arthritis (HCC)    Followed by rheumatology and dermatology.  She is prescribed methotrexate.  She is on folate supplementation.  She uses fluocinonide as needed for psoriasis flares.  No changes today.      Ankylosing spondylitis of multiple sites in spine [facility](HCC)    Followed by rheumatology.  She is prescribed Rinvoq.  She also takes naproxen as needed for pain relief.  She has establish care with [facility] for [facility] of chronic back pain and receives periodic steroid injections.  She also takes Flexeril as needed for pain relief.  No changes today.      Rosacea    Followed by dermatology.  Has MetroGel for as needed use.        Other   Obesity    BMI 39.  She has most recently been followed by [facility] and treated with NP thyroid and a testosterone pellet for weight loss.  She expresses concerns over continuing these therapies because of potential side effects.  She remains interested in losing weight but does not want to do so at the expense of her long-term health. -I expressed my concern over continued use of NP thyroid, testosterone, or phentermine for weight loss.  She is in agreement as she has recently learned of the potential negative long-term side effects.  We will plan for follow-up in 1-2 months to discuss starting GLP-1 agonist therapy.  I have recommended that she research these  medications in the interim.      Depression with anxiety    Not currently on any antidepressant treatment.  Reports stability in her mood today.  PHQ-2 score 0      Vitamin D deficiency    Currently on vitamin D supplementation.  She reports a history of difficulty achieving an adequate vitamin D level.  Repeat  vitamin D level ordered today.      Preventative health care    Presenting today to establish care. -Basic labs ordered today, including one-time HCV screening -Flu vaccine administered today -Tdap/Shingrix deferred to follow-up -She is scheduled for a mammogram tomorrow (9/21) -Pap smears are completed by her OB/GYN ([place]) and are up-to-date      Return in about 4 weeks (around 09/21/2022).   Johnette Abraham, MD

## 2022-08-26 LAB — CBC WITH DIFFERENTIAL/PLATELET
Basophils Absolute: 0.1 10*3/uL (ref 0.0–0.2)
Basos: 1 %
EOS (ABSOLUTE): 0.1 10*3/uL (ref 0.0–0.4)
Eos: 2 %
Hematocrit: 41.3 % (ref 34.0–46.6)
Hemoglobin: 13.8 g/dL (ref 11.1–15.9)
Immature Grans (Abs): 0 10*3/uL (ref 0.0–0.1)
Immature Granulocytes: 0 %
Lymphocytes Absolute: 1.4 10*3/uL (ref 0.7–3.1)
Lymphs: 22 %
MCH: 28.8 pg (ref 26.6–33.0)
MCHC: 33.4 g/dL (ref 31.5–35.7)
MCV: 86 fL (ref 79–97)
Monocytes Absolute: 0.8 10*3/uL (ref 0.1–0.9)
Monocytes: 12 %
Neutrophils Absolute: 3.9 10*3/uL (ref 1.4–7.0)
Neutrophils: 63 %
Platelets: 389 10*3/uL (ref 150–450)
RBC: 4.79 x10E6/uL (ref 3.77–5.28)
RDW: 13 % (ref 11.7–15.4)
WBC: 6.3 10*3/uL (ref 3.4–10.8)

## 2022-08-26 LAB — LIPID PANEL
Chol/HDL Ratio: 7.2 ratio — ABNORMAL HIGH (ref 0.0–4.4)
Cholesterol, Total: 231 mg/dL — ABNORMAL HIGH (ref 100–199)
HDL: 32 mg/dL — ABNORMAL LOW (ref >39)
LDL Chol Calc (NIH): 135 mg/dL — ABNORMAL HIGH (ref 0–99)
Triglycerides: 354 mg/dL — ABNORMAL HIGH (ref 0–149)
VLDL Cholesterol Cal: 64 mg/dL — ABNORMAL HIGH (ref 5–40)

## 2022-08-26 LAB — CMP14+EGFR
ALT: 19 IU/L (ref 0–32)
AST: 20 IU/L (ref 0–40)
Albumin/Globulin Ratio: 1.4 (ref 1.2–2.2)
Albumin: 4.3 g/dL (ref 3.8–4.9)
Alkaline Phosphatase: 82 IU/L (ref 44–121)
BUN/Creatinine Ratio: 15 (ref 9–23)
BUN: 11 mg/dL (ref 6–24)
Bilirubin Total: 0.3 mg/dL (ref 0.0–1.2)
CO2: 20 mmol/L (ref 20–29)
Calcium: 9.4 mg/dL (ref 8.7–10.2)
Chloride: 99 mmol/L (ref 96–106)
Creatinine, Ser: 0.71 mg/dL (ref 0.57–1.00)
Globulin, Total: 3 g/dL (ref 1.5–4.5)
Sodium: 139 mmol/L (ref 134–144)
Total Protein: 7.3 g/dL (ref 6.0–8.5)
eGFR: 103 mL/min/{1.73_m2} (ref >59)

## 2022-08-26 LAB — SPECIMEN STATUS REPORT

## 2022-08-26 LAB — TSH+FREE T4
Free T4: 1.06 ng/dL (ref 0.82–1.77)
TSH: 2.19 u[IU]/mL (ref 0.450–4.500)

## 2022-08-26 LAB — HEMOGLOBIN A1C
Est. average glucose Bld gHb Est-mCnc: 120 mg/dL
Hgb A1c MFr Bld: 5.8 % — ABNORMAL HIGH (ref 4.8–5.6)

## 2022-08-26 LAB — B12 AND FOLATE PANEL
Folate: 8.8 ng/mL (ref >3.0)
Vitamin B-12: 836 pg/mL (ref 232–1245)

## 2022-08-26 LAB — HCV AB W REFLEX TO QUANT PCR: HCV Ab: NONREACTIVE

## 2022-08-26 LAB — VITAMIN D 25 HYDROXY (VIT D DEFICIENCY, FRACTURES): Vit D, 25-Hydroxy: 23.1 ng/mL — ABNORMAL LOW (ref 30.0–100.0)

## 2022-08-26 LAB — HCV INTERPRETATION

## 2022-08-29 ENCOUNTER — Encounter: Payer: Self-pay | Admitting: Internal Medicine

## 2022-08-29 LAB — HM MAMMOGRAPHY

## 2022-09-09 ENCOUNTER — Ambulatory Visit: Payer: 59 | Admitting: Vascular Surgery

## 2022-09-14 ENCOUNTER — Ambulatory Visit: Payer: 59 | Admitting: Vascular Surgery

## 2022-09-14 ENCOUNTER — Encounter: Payer: Self-pay | Admitting: Vascular Surgery

## 2022-09-14 VITALS — BP 141/86 | HR 69 | Temp 97.6°F | Resp 16 | Ht 68.0 in | Wt 260.0 lb

## 2022-09-14 DIAGNOSIS — I872 Venous insufficiency (chronic) (peripheral): Secondary | ICD-10-CM

## 2022-09-14 NOTE — Progress Notes (Signed)
REASON FOR VISIT:   56-month follow-up visit.  MEDICAL ISSUES:   CHRONIC VENOUS DISEASE: This patient has CEAP C1 venous disease.  Some of her symptoms could be attributed to venous hypertension however currently she has no deep venous reflux on the right and only superficial venous reflux.  The vein on the right is not especially dilated.  We have discussed the importance of intermittent leg elevation and the proper positioning for this.  I have encouraged her to continue to wear her compression stockings.  We discussed the importance of exercise specifically walking and water aerobics.  I encouraged her to avoid prolonged sitting and standing.  We also discussed the importance of maintaining a healthy weight.  If her symptoms progress then certainly we can reevaluate.  We will see her back in a year and at that time we will obtain formal venous reflux testing on the left as we have not previously evaluated the left.  She will call sooner if her symptoms progress.   HPI:   Crystal Houston is a pleasant 51 y.o. female who was seen by Dr. Karin Lieu on 06/03/2022.  The patient had been referred for evaluation of bilateral ankle swelling.  This was more significant on the right side.  She does work with her legs down most of the time.  She did describe some pain when walking and does have a diagnosis of psoriatic arthritis.  She had no previous history of DVT.  On exam she had telangiectasias bilaterally with some small varicose veins at the medial aspect of her right knee.  On my history today she continues to have symptoms in both legs.  I agree with Dr. Karin Lieu that some of this certainly could be related to her arthritis.  She does describe some aching pain in her legs which is worse at the end of the day so certainly there may be a component of venous hypertension causing some of her symptoms.  She has no previous history of DVT.  She has been wearing her thigh-high compression stockings which do  help some.  She also has been elevating her legs.  She does remain active as she is helping take care of her grandbaby.  Past Medical History:  Diagnosis Date   Anxiety    Depression    OSA on CPAP 08/10/2017   Psoriatic arthritis (HCC)     Family History  Problem Relation Age of Onset   Stroke Mother    Hearing loss Father    Cancer Daughter        osteosarcoma of leg   Stroke Maternal Grandmother    Dementia Maternal Grandmother    Lymphoma Maternal Grandfather    Diabetes Paternal Grandmother    Hypertension Paternal Grandmother    Dementia Paternal Grandmother    Cancer Paternal Grandfather        colon    SOCIAL HISTORY: Social History   Tobacco Use   Smoking status: Never   Smokeless tobacco: Never  Substance Use Topics   Alcohol use: Yes    Alcohol/week: 2.0 standard drinks of alcohol    Types: 2 Glasses of wine per week    Comment: Very rare    No Known Allergies  Current Outpatient Medications  Medication Sig Dispense Refill   albuterol (VENTOLIN HFA) 108 (90 Base) MCG/ACT inhaler Inhale 2 puffs into the lungs every 6 (six) hours as needed for wheezing or shortness of breath. 3 Inhaler 2   BIOTIN PO Take 1 tablet  by mouth daily.     Cholecalciferol (VITAMIN D3) 5000 units CAPS Take 10,000 Units by mouth daily.     cyclobenzaprine (FLEXERIL) 10 MG tablet Take 10 mg by mouth daily as needed (Back pain).     folic acid (FOLVITE) 1 MG tablet Take 1 mg by mouth daily.     methotrexate 50 MG/2ML injection Inject 50 mg/m2 into the skin every Friday. Dose is 0.6 ml     metroNIDAZOLE (METROGEL) 1 % gel Apply 1 application topically daily as needed (Rosacea).     naproxen (NAPROSYN) 500 MG tablet Take 500 mg by mouth 2 (two) times daily as needed (Back pain).     Needles & Syringes (1CC TB SYRINGE) MISC once injection     nitroGLYCERIN (NITROSTAT) 0.4 MG SL tablet See admin instructions.     thyroid (NP THYROID) 30 MG tablet Take 1 tablet (30 mg total) by mouth daily  before breakfast. 90 tablet 3   No current facility-administered medications for this visit.    REVIEW OF SYSTEMS:  [X]  denotes positive finding, [ ]  denotes negative finding Cardiac  Comments:  Chest pain or chest pressure:    Shortness of breath upon exertion:    Short of breath when lying flat:    Irregular heart rhythm:        Vascular    Pain in calf, thigh, or hip brought on by ambulation:    Pain in feet at night that wakes you up from your sleep:     Blood clot in your veins:    Leg swelling:         Pulmonary    Oxygen at home:    Productive cough:     Wheezing:         Neurologic    Sudden weakness in arms or legs:     Sudden numbness in arms or legs:     Sudden onset of difficulty speaking or slurred speech:    Temporary loss of vision in one eye:     Problems with dizziness:         Gastrointestinal    Blood in stool:     Vomited blood:         Genitourinary    Burning when urinating:     Blood in urine:        Psychiatric    Major depression:         Hematologic    Bleeding problems:    Problems with blood clotting too easily:        Skin    Rashes or ulcers:        Constitutional    Fever or chills:     PHYSICAL EXAM:   Vitals:   09/14/22 1533  BP: (!) 141/86  Pulse: 69  Resp: 16  Temp: 97.6 F (36.4 C)  TempSrc: Temporal  SpO2: (!) 69%  Weight: 260 lb (117.9 kg)  Height: 5\' 8"  (1.727 m)    GENERAL: The patient is a well-nourished female, in no acute distress. The vital signs are documented above. CARDIAC: There is a regular rate and rhythm.  VASCULAR: I do not detect carotid bruits. I had a difficult time palpating pedal pulses however she had a biphasic anterior tibial and posterior tibial signal with the Doppler bilaterally. She has some telangiectasias in both legs and some small varicose veins in her medial right thigh.       I did look at the great saphenous vein myself with  the Son[employer].  The vein is fairly small from  the mid thigh to the knee.  It is larger in the proximal thigh.  Diameters range from 5 to 6 mm. PULMONARY: There is good air exchange bilaterally without wheezing or rales. ABDOMEN: Soft and non-tender with normal pitched bowel sounds.  MUSCULOSKELETAL: There are no major deformities or cyanosis. NEUROLOGIC: No focal weakness or paresthesias are detected. SKIN: There are no ulcers or rashes noted. PSYCHIATRIC: The patient has a normal affect.  DATA:    VENOUS DUPLEX: I have reviewed the venous duplex scan that was done on 06/03/2022.  This was of the right lower extremity only.  There was no evidence of DVT.  There was no deep venous reflux.  There was superficial venous reflux in the right great saphenous vein from the saphenofemoral junction to the knee.  In the thigh the vein had diameters ranging from 5.2-6 mm.  It was smaller in the distal thigh and around the knee.     Waverly Ferrari [facility] [phone]

## 2022-09-21 ENCOUNTER — Ambulatory Visit: Payer: 59 | Admitting: Internal Medicine

## 2022-09-21 ENCOUNTER — Encounter: Payer: Self-pay | Admitting: Internal Medicine

## 2022-09-21 VITALS — BP 126/81 | HR 89 | Ht 68.0 in | Wt 259.8 lb

## 2022-09-21 DIAGNOSIS — Z23 Encounter for immunization: Secondary | ICD-10-CM | POA: Diagnosis not present

## 2022-09-21 DIAGNOSIS — E559 Vitamin D deficiency, unspecified: Secondary | ICD-10-CM

## 2022-09-21 DIAGNOSIS — E782 Mixed hyperlipidemia: Secondary | ICD-10-CM

## 2022-09-21 DIAGNOSIS — J302 Other seasonal allergic rhinitis: Secondary | ICD-10-CM | POA: Diagnosis not present

## 2022-09-21 DIAGNOSIS — E6609 Other obesity due to excess calories: Secondary | ICD-10-CM

## 2022-09-21 DIAGNOSIS — R7303 Prediabetes: Secondary | ICD-10-CM | POA: Diagnosis not present

## 2022-09-21 DIAGNOSIS — E785 Hyperlipidemia, unspecified: Secondary | ICD-10-CM | POA: Insufficient documentation

## 2022-09-21 DIAGNOSIS — J45998 Other asthma: Secondary | ICD-10-CM

## 2022-09-21 DIAGNOSIS — Z6839 Body mass index (BMI) 39.0-39.9, adult: Secondary | ICD-10-CM

## 2022-09-21 MED ORDER — MONTELUKAST SODIUM 10 MG PO TABS: 10.0000 mg | ORAL_TABLET | Freq: Every day | ORAL | 3 refills | Status: DC

## 2022-09-21 MED ORDER — OZEMPIC (0.25 OR 0.5 MG/DOSE) 2 MG/3ML ~~LOC~~ SOPN: 0.2500 mg | PEN_INJECTOR | SUBCUTANEOUS | 1 refills | Status: DC

## 2022-09-21 NOTE — Patient Instructions (Signed)
It was a pleasure to see you today.  Thank you for giving [place] the opportunity to be involved in your care.  Below is a brief recap of your visit and next steps.  We will plan to see you again in 6 weeks.  Summary We will start Ozempic today Follow up in 6 weeks to see how things are going

## 2022-09-21 NOTE — Assessment & Plan Note (Signed)
A1c 5.8 on labs from last month.  We reviewed reviewed lifestyle modifications aimed at weight loss and she will start Ozempic today.

## 2022-09-21 NOTE — Assessment & Plan Note (Signed)
She requests a refill of montelukast today as this has been helpful for her symptoms in the past.

## 2022-09-21 NOTE — Progress Notes (Signed)
Established Patient Office Visit  Subjective   Patient ID: Crystal Houston, female    DOB: Mar 29, 1971  Age: 51 y.o. MRN: [id number]  Chief Complaint  Patient presents with   Follow-up   Crystal Houston returns to care today.  She is a 51 year old woman with a past medical history significant for psoriatic arthritis, ankylosing spondylitis, chronic MSK pain, seasonal asthma, OSA, obesity, chronic venous disease, and rosacea.  She was last seen by me on 9/20 to establish care.  Basic labs were drawn at that time and 4-week follow-up arranged to further discuss weight loss.  There have been no acute interval events.  She was seen by vascular surgery in follow-up on 10/11 to discuss chronic venous disease.  Today Crystal Houston is feeling well.  She has no acute concerns and states that she will be leaving for a cruise 1 week from Saturday.  She is interested in starting a medication for weight loss and is interested in [place] or Ozempic.  She also request a refill of montelukast for seasonal allergies.  This has been effective for her in the past.  Acute concerns, chronic medical conditions, and outstanding preventative healthcare maintenance items discussed today are individually addressed in A/P below.  Past Medical History:  Diagnosis Date   Anxiety    Depression    OSA on CPAP 08/10/2017   Psoriatic arthritis [facility])    Past Surgical History:  Procedure Laterality Date   COLONOSCOPY WITH PROPOFOL N/A 03/26/2021   Procedure: COLONOSCOPY WITH PROPOFOL;  Surgeon: Eloise Harman, DO;  [facility];  Service: Endoscopy;  Laterality: N/A;  ASA II / AM procedure   FRACTURE SURGERY     I & D EXTREMITY  11/10/2011   Procedure: IRRIGATION AND DEBRIDEMENT EXTREMITY;  Surgeon: Gearlean Alf;  Location: [place];  Service: Orthopedics;  Laterality: Right;   LEFT HEART CATH AND CORONARY ANGIOGRAPHY N/A 06/18/2018   Procedure: LEFT HEART CATH AND CORONARY ANGIOGRAPHY;  Surgeon: Lorretta Harp, MD;   Location: [place] CV LAB;  Service: Cardiovascular;  Laterality: N/A;   ORIF ANKLE FRACTURE  11/10/2011   Procedure: OPEN REDUCTION INTERNAL FIXATION (ORIF) ANKLE FRACTURE;  Surgeon: Gearlean Alf;  Location: [place];  Service: Orthopedics;  Laterality: Right;  with screw applicaton   Social History   Tobacco Use   Smoking status: Never   Smokeless tobacco: Never  Vaping Use   Vaping Use: Never used  Substance Use Topics   Alcohol use: Yes    Alcohol/week: 2.0 standard drinks of alcohol    Types: 2 Glasses of wine per week    Comment: Very rare   Drug use: No   Family History  Problem Relation Age of Onset   Stroke Mother    Hearing loss Father    Cancer Daughter        osteosarcoma of leg   Stroke Maternal Grandmother    Dementia Maternal Grandmother    Lymphoma Maternal Grandfather    Diabetes Paternal Grandmother    Hypertension Paternal Grandmother    Dementia Paternal Grandmother    Cancer Paternal Grandfather        colon   No Known Allergies  Review of Systems  Constitutional:  Negative for chills and fever.  HENT:  Positive for congestion. Negative for sore throat.   Respiratory:  Negative for cough and shortness of breath.   Cardiovascular:  Negative for chest pain, palpitations and leg swelling.  Gastrointestinal:  Negative for abdominal  pain, blood in stool, constipation, diarrhea, nausea and vomiting.  Genitourinary:  Negative for dysuria and hematuria.  Musculoskeletal:  Negative for myalgias.  Skin:  Negative for itching and rash.  Neurological:  Negative for dizziness and headaches.  Psychiatric/Behavioral:  Negative for depression and suicidal ideas.      Objective:     BP 126/81   Pulse 89   Ht 5' 8"  (1.727 m)   Wt 259 lb 12.8 oz (117.8 kg)   SpO2 93%   BMI 39.50 kg/m  BP Readings from Last 3 Encounters:  09/21/22 126/81  09/14/22 (!) 141/86  08/24/22 126/80   Physical Exam Constitutional:      General: She is not in acute  distress.    Appearance: Normal appearance. She is obese. She is not toxic-appearing.  HENT:     Head: Normocephalic and atraumatic.     Right Ear: External ear normal.     Left Ear: External ear normal.     Nose: Nose normal. No congestion or rhinorrhea.     Mouth/Throat:     Mouth: Mucous membranes are moist.     Pharynx: Oropharynx is clear. No oropharyngeal exudate or posterior oropharyngeal erythema.  Eyes:     General: No scleral icterus.    Extraocular Movements: Extraocular movements intact.     Conjunctiva/sclera: Conjunctivae normal.     Pupils: Pupils are equal, round, and reactive to light.  Cardiovascular:     Rate and Rhythm: Normal rate and regular rhythm.     Pulses: Normal pulses.     Heart sounds: Normal heart sounds. No murmur heard.    No friction rub. No gallop.  Pulmonary:     Effort: Pulmonary effort is normal.     Breath sounds: Normal breath sounds. No wheezing, rhonchi or rales.  Abdominal:     General: Abdomen is flat. Bowel sounds are normal. There is no distension.     Palpations: Abdomen is soft.     Tenderness: There is no abdominal tenderness.  Musculoskeletal:        General: Normal range of motion.     Cervical back: Normal range of motion.     Comments: Nonpitting bilateral lower extremity edema  Lymphadenopathy:     Cervical: No cervical adenopathy.  Skin:    General: Skin is warm and dry.     Capillary Refill: Capillary refill takes less than 2 seconds.     Coloration: Skin is not jaundiced.  Neurological:     General: No focal deficit present.     Mental Status: She is oriented to person, place, and time.  Psychiatric:        Mood and Affect: Mood normal.        Behavior: Behavior normal.    Last CBC Lab Results  Component Value Date   WBC 6.3 08/24/2022   HGB 13.8 08/24/2022   HCT 41.3 08/24/2022   MCV 86 08/24/2022   MCH 28.8 08/24/2022   RDW 13.0 08/24/2022   PLT 389 29/56/2130   Last metabolic panel Lab Results   Component Value Date   GLUCOSE CANCELED 08/24/2022   NA 139 08/24/2022   K CANCELED 08/24/2022   CL 99 08/24/2022   CO2 20 08/24/2022   BUN 11 08/24/2022   CREATININE 0.71 08/24/2022   EGFR 103 08/24/2022   CALCIUM 9.4 08/24/2022   PROT 7.3 08/24/2022   ALBUMIN 4.3 08/24/2022   LABGLOB 3.0 08/24/2022   AGRATIO 1.4 08/24/2022   BILITOT 0.3 08/24/2022  ALKPHOS 82 08/24/2022   AST 20 08/24/2022   ALT 19 08/24/2022   ANIONGAP 8 06/11/2018   Last lipids Lab Results  Component Value Date   CHOL 231 (H) 08/24/2022   HDL 32 (L) 08/24/2022   LDLCALC 135 (H) 08/24/2022   TRIG 354 (H) 08/24/2022   CHOLHDL 7.2 (H) 08/24/2022   Last hemoglobin A1c Lab Results  Component Value Date   HGBA1C 5.8 (H) 08/24/2022   Last thyroid functions Lab Results  Component Value Date   TSH 2.190 08/24/2022   T4TOTAL 6.6 03/26/2012   Last vitamin D Lab Results  Component Value Date   VD25OH 23.1 (L) 08/24/2022   Last vitamin B12 and Folate Lab Results  Component Value Date   VITAMINB12 836 08/24/2022   FOLATE 8.8 08/24/2022   The 10-year ASCVD risk score (Arnett DK, et al., 2019) is: 3%    Assessment & Plan:   Problem List Items Addressed This Visit       Seasonal asthma    She requests a refill of montelukast today as this has been helpful for her symptoms in the past.      Obesity - Primary    BMI 39.5.  She expresses frustration with inability to lose weight.  Ms. Michelli has made diet modifications including cutting out fatty/fried foods.  She incorporating more fruits and vegetables into her diet as well.  She states that her exercise capacity is limited secondary to chronic musculoskeletal pain, but she attempts to exercise by performing activities around her home.  She has previously been prescribed NP thyroid, phentermine, and testosterone supplementation for weight loss.  We discussed potentially starting GLP-1 therapy at her last appointment.  She has researched GLP-1  agents since her last appointment and is interested in [place] or Ozempic. -Through shared decision making, Ms. Ticas is in agreement with starting Ozempic 0.25 mg weekly injections today.  She will start the medication when she returns from her cruise and we will plan for 6-week follow-up for reassessment. -I encouraged Ms. Ledo to maintain her current dieting habits and limit fatty/fried foods.  I also encouraged her to increase her weekly exercise is much as she is able to.      Vitamin D deficiency    Vitamin D level 23.1 on labs from last month.  She is currently on daily vitamin D supplementation.      Prediabetes    A1c 5.8 on labs from last month.  We reviewed reviewed lifestyle modifications aimed at weight loss and she will start Ozempic today.      Hyperlipidemia    Total cholesterol 231, LDL 135 on lipid profile from last month.  Her 10-year ASCVD risk is 3%.  I reviewed the need to limit fatty/fried foods and she is starting Ozempic today.  Plan to repeat lipid panel in 3 months.      Need for Tdap vaccination    Tdap vaccine administered today      Return in about 6 weeks (around 11/02/2022).    Johnette Abraham, MD

## 2022-09-21 NOTE — Assessment & Plan Note (Signed)
BMI 39.5.  She expresses frustration with inability to lose weight.  Crystal Houston has made diet modifications including cutting out fatty/fried foods.  She incorporating more fruits and vegetables into her diet as well.  She states that her exercise capacity is limited secondary to chronic musculoskeletal pain, but she attempts to exercise by performing activities around her home.  She has previously been prescribed NP thyroid, phentermine, and testosterone supplementation for weight loss.  We discussed potentially starting GLP-1 therapy at her last appointment.  She has researched GLP-1 agents since her last appointment and is interested in [place] or Ozempic. -Through shared decision making, Crystal Houston is in agreement with starting Ozempic 0.25 mg weekly injections today.  She will start the medication when she returns from her cruise and we will plan for 6-week follow-up for reassessment. -I encouraged Crystal Houston to maintain her current dieting habits and limit fatty/fried foods.  I also encouraged her to increase her weekly exercise is much as she is able to.

## 2022-09-21 NOTE — Assessment & Plan Note (Signed)
Vitamin D level 23.1 on labs from last month.  She is currently on daily vitamin D supplementation.

## 2022-09-21 NOTE — Assessment & Plan Note (Signed)
-   Tdap vaccine administered today.

## 2022-09-21 NOTE — Assessment & Plan Note (Signed)
Total cholesterol 231, LDL 135 on lipid profile from last month.  Her 10-year ASCVD risk is 3%.  I reviewed the need to limit fatty/fried foods and she is starting Ozempic today.  Plan to repeat lipid panel in 3 months.

## 2022-09-22 ENCOUNTER — Encounter: Payer: Self-pay | Admitting: Internal Medicine

## 2022-09-26 ENCOUNTER — Other Ambulatory Visit: Payer: Self-pay

## 2022-09-29 ENCOUNTER — Other Ambulatory Visit: Payer: Self-pay

## 2022-09-29 MED ORDER — WEGOVY 0.25 MG/0.5ML ~~LOC~~ SOAJ: 0.2500 mg | SUBCUTANEOUS | 0 refills | Status: DC

## 2022-10-03 ENCOUNTER — Other Ambulatory Visit: Payer: Self-pay

## 2022-10-25 ENCOUNTER — Encounter: Payer: Self-pay | Admitting: Internal Medicine

## 2022-11-02 ENCOUNTER — Ambulatory Visit: Payer: 59 | Admitting: Internal Medicine

## 2022-11-16 ENCOUNTER — Other Ambulatory Visit: Payer: Self-pay

## 2022-11-16 MED ORDER — WEGOVY 0.25 MG/0.5ML ~~LOC~~ SOAJ: 0.2500 mg | SUBCUTANEOUS | 0 refills | Status: DC

## 2022-11-22 ENCOUNTER — Encounter: Payer: Self-pay | Admitting: Family Medicine

## 2022-11-22 ENCOUNTER — Ambulatory Visit (INDEPENDENT_AMBULATORY_CARE_PROVIDER_SITE_OTHER): Payer: 59 | Admitting: Family Medicine

## 2022-11-22 DIAGNOSIS — R058 Other specified cough: Secondary | ICD-10-CM | POA: Diagnosis not present

## 2022-11-22 DIAGNOSIS — B349 Viral infection, unspecified: Secondary | ICD-10-CM

## 2022-11-22 MED ORDER — GUAIFENESIN 100 MG/5ML PO LIQD: 5.0000 mL | ORAL | 0 refills | Status: DC | PRN

## 2022-11-22 NOTE — Progress Notes (Signed)
Virtual Visit via Telephone Note   This visit type was conducted via telephone. This format is felt to be most appropriate for this patient at this time.  The patient did not have access to video technology/had technical difficulties with video requiring transitioning to audio format only (telephone).  All issues noted in this document were discussed and addressed.  No physical exam could be performed with this format.  Evaluation Performed:  Follow-up visit  Date:  11/22/2022   ID:  Crystal Houston, Crystal Houston Oct 07, 1971, MRN [id number]  Patient Location: Home Provider Location: Clinic  Participants: Patient Location of Patient: Home Location of Provider: Clinic Consent was obtain for visit to be over via telehealth. I verified that I am speaking with the correct person using two identifiers.  PCP:  Billie Lade, MD   Chief Complaint: Chest congestion  History of Present Illness:    Crystal Houston is a 51 y.o. female with complaints of chest  congestion, rhinorrhea and a nonproductive cough since 11/20/2022.  She denies fever, chills, muscle aches and pain, sore throat, nausea, and headaches.  She reports recent sick contact.  She reports history of seasonal asthma, and has been taking her Singulair.  The patient does have symptoms concerning for COVID-19 infection (fever, chills, cough, or new shortness of breath).   Past Medical, Surgical, Social History, Allergies, and Medications have been Reviewed.  Past Medical History:  Diagnosis Date   Anxiety    Depression    OSA on CPAP 08/10/2017   Psoriatic arthritis [facility])    Past Surgical History:  Procedure Laterality Date   COLONOSCOPY WITH PROPOFOL N/A 03/26/2021   Procedure: COLONOSCOPY WITH PROPOFOL;  Surgeon: Lanelle Bal, DO;  [facility];  Service: Endoscopy;  Laterality: N/A;  ASA II / AM procedure   FRACTURE SURGERY     I & D EXTREMITY  11/10/2011   Procedure: IRRIGATION AND DEBRIDEMENT  EXTREMITY;  Surgeon: Loanne Drilling;  [facility];  Service: Orthopedics;  Laterality: Right;   LEFT HEART CATH AND CORONARY ANGIOGRAPHY N/A 06/18/2018   Procedure: LEFT HEART CATH AND CORONARY ANGIOGRAPHY;  Surgeon: Runell Gess, MD;  [facility];  Service: Cardiovascular;  Laterality: N/A;   ORIF ANKLE FRACTURE  11/10/2011   Procedure: OPEN REDUCTION INTERNAL FIXATION (ORIF) ANKLE FRACTURE;  Surgeon: Loanne Drilling;  Location: [facility];  Service: Orthopedics;  Laterality: Right;  with screw applicaton     Current Meds  Medication Sig   albuterol (VENTOLIN HFA) 108 (90 Base) MCG/ACT inhaler Inhale 2 puffs into the lungs every 6 (six) hours as needed for wheezing or shortness of breath.   BIOTIN PO Take 1 tablet by mouth daily.   Cholecalciferol (VITAMIN D3) 5000 units CAPS Take 10,000 Units by mouth daily.   cyclobenzaprine (FLEXERIL) 10 MG tablet Take 10 mg by mouth daily as needed (Back pain).   folic acid (FOLVITE) 1 MG tablet Take 1 mg by mouth daily.   guaiFENesin (ROBITUSSIN) 100 MG/5ML liquid Take 5 mLs by mouth every 4 (four) hours as needed for cough or to loosen phlegm.   methotrexate 50 MG/2ML injection Inject 50 mg/m2 into the skin every Friday. Dose is 0.6 ml   metroNIDAZOLE (METROGEL) 1 % gel Apply 1 application topically daily as needed (Rosacea).   montelukast (SINGULAIR) 10 MG tablet Take 1 tablet (10 mg total) by mouth at bedtime.   naproxen (NAPROSYN) 500 MG tablet Take 500 mg by  mouth 2 (two) times daily as needed (Back pain).   Needles & Syringes (1CC TB SYRINGE) MISC once injection   nitroGLYCERIN (NITROSTAT) 0.4 MG SL tablet See admin instructions.   predniSONE (STERAPRED UNI-PAK 48 TAB) 5 MG (48) TBPK tablet Take 10 mg by mouth as needed.   pregabalin (LYRICA) 75 MG capsule Take by mouth.   RINVOQ 15 MG TB24 Take 1 tablet by mouth daily.   Semaglutide-Weight Management (WEGOVY) 0.25 MG/0.5ML SOAJ Inject 0.25 mg into the skin once a week.      Allergies:   Patient has no known allergies.   ROS:   Please see the history of present illness.     All other systems reviewed and are negative.   Labs/Other Tests and Data Reviewed:    Recent Labs: 08/24/2022: ALT 19; BUN 11; Creatinine, Ser 0.71; Hemoglobin 13.8; Platelets 389; Potassium CANCELED; Sodium 139; TSH 2.190   Recent Lipid Panel Lab Results  Component Value Date/Time   CHOL 231 (H) 08/24/2022 12:00 AM   TRIG 354 (H) 08/24/2022 12:00 AM   HDL 32 (L) 08/24/2022 12:00 AM   CHOLHDL 7.2 (H) 08/24/2022 12:00 AM   LDLCALC 135 (H) 08/24/2022 12:00 AM    Wt Readings from Last 3 Encounters:  09/21/22 259 lb 12.8 oz (117.8 kg)  09/14/22 260 lb (117.9 kg)  08/24/22 257 lb (116.6 kg)     Objective:    Vital Signs:  There were no vitals taken for this visit.     ASSESSMENT & PLAN:   Viral illness We will test patient for COVID and flu Guaifenesin ordered for cough and chest congestion Encouraged use of heated humidifier at home to open up her sinuses  Time:   Today, I have spent 12 minutes reviewing the chart, including problem list, medications, and with the patient with telehealth technology discussing the above problems.   Medication Adjustments/Labs and Tests Ordered: Current medicines are reviewed at length with the patient today.  Concerns regarding medicines are outlined above.   Tests Ordered: Orders Placed This Encounter  Procedures   Coronavirus (COVID-19) with Influenza A and Influenza B    Medication Changes: Meds ordered this encounter  Medications   guaiFENesin (ROBITUSSIN) 100 MG/5ML liquid    Sig: Take 5 mLs by mouth every 4 (four) hours as needed for cough or to loosen phlegm.    Dispense:  120 mL    Refill:  0     Note: This dictation was prepared with Dragon dictation along with smaller phrase technology. Similar sounding words can be transcribed inadequately or may not be corrected upon review. Any transcriptional errors that  result from this process are unintentional.      Disposition:  Follow up  Signed, Gilmore Laroche, FNP  11/22/2022 9:33 PM     [facility]

## 2022-11-24 ENCOUNTER — Other Ambulatory Visit: Payer: Self-pay | Admitting: Family Medicine

## 2022-11-24 DIAGNOSIS — J101 Influenza due to other identified influenza virus with other respiratory manifestations: Secondary | ICD-10-CM

## 2022-11-24 LAB — COVID-19, FLU A+B NAA
Influenza A, NAA: DETECTED — AB
Influenza B, NAA: NOT DETECTED
SARS-CoV-2, NAA: NOT DETECTED

## 2022-11-24 MED ORDER — OSELTAMIVIR PHOSPHATE 75 MG PO CAPS: 75.0000 mg | ORAL_CAPSULE | Freq: Two times a day (BID) | ORAL | 0 refills | Status: AC

## 2022-11-24 NOTE — Telephone Encounter (Signed)
scheduled

## 2022-11-24 NOTE — Progress Notes (Signed)
Please inform the patient that she tested negative for COVID, but tested positive for flu.  A prescription of oseltamavir 75 mg twice daily for 5 days  has been sent to her pharmacy to start taking.  Encourage the patient to stay well-hydrated, and to get adequate rest.

## 2022-12-12 ENCOUNTER — Encounter: Payer: Self-pay | Admitting: Internal Medicine

## 2022-12-12 ENCOUNTER — Ambulatory Visit: Payer: 59 | Admitting: Internal Medicine

## 2022-12-12 VITALS — BP 130/82 | HR 88 | Ht 68.0 in | Wt 246.0 lb

## 2022-12-12 DIAGNOSIS — Z6837 Body mass index (BMI) 37.0-37.9, adult: Secondary | ICD-10-CM

## 2022-12-12 DIAGNOSIS — F5101 Primary insomnia: Secondary | ICD-10-CM | POA: Diagnosis not present

## 2022-12-12 DIAGNOSIS — E6609 Other obesity due to excess calories: Secondary | ICD-10-CM | POA: Diagnosis not present

## 2022-12-12 DIAGNOSIS — F418 Other specified anxiety disorders: Secondary | ICD-10-CM

## 2022-12-12 DIAGNOSIS — G4700 Insomnia, unspecified: Secondary | ICD-10-CM | POA: Insufficient documentation

## 2022-12-12 MED ORDER — SEMAGLUTIDE-WEIGHT MANAGEMENT 0.5 MG/0.5ML ~~LOC~~ SOAJ: 0.5000 mg | SUBCUTANEOUS | 0 refills | Status: DC

## 2022-12-12 MED ORDER — HYDROXYZINE PAMOATE 25 MG PO CAPS: 25.0000 mg | ORAL_CAPSULE | Freq: Three times a day (TID) | ORAL | 0 refills | Status: DC | PRN

## 2022-12-12 NOTE — Assessment & Plan Note (Signed)
Today she endorses insomnia after recently ending a 7-year relationship with her boyfriend.  She states that she feels fine during the day, however at night she has trouble both falling and staying asleep.  She is interested in a temporary medication to help with insomnia. -Hydroxyzine 25 mg nightly as needed prescribed for insomnia

## 2022-12-12 NOTE — Patient Instructions (Signed)
It was a pleasure to see you today.  Thank you for giving [place] the opportunity to be involved in your care.  Below is a brief recap of your visit and next steps.  We will plan to see you again in 3 months.  Summary Increase Wegovy to 0.5 mg weekly x 4 weeks I have prescribed hydroxyzine 25 mg as needed for sleep Follow up in 3 months

## 2022-12-12 NOTE — Assessment & Plan Note (Signed)
She is not currently prescribed any medication for anxiety or depression.  Her symptoms have been worse recently after ending a 7-year relationship.  She specifically has difficulty with insomnia. -Hydroxyzine 25 mg nightly as needed has been prescribed today

## 2022-12-12 NOTE — Assessment & Plan Note (Signed)
She was able to start [facility] recently.  She will administer her fourth injection of 0.25 mg today.  She has lost approximately 15 pounds since October.  She continues to her diet and attempts to exercise regularly. -Increase Wegovy to 0.5 mg weekly x 4 weeks -She was congratulated on her progress thus far and counseled on continuing lifestyle modifications aimed at weight loss

## 2022-12-12 NOTE — Progress Notes (Signed)
Established Patient Office Visit  Subjective   Patient ID: Crystal Houston, female    DOB: 12/23/1970  Age: 52 y.o. MRN: [id number]  Chief Complaint  Patient presents with   Weight Loss    Increase dose   New Med Request   [name] returns to care today for follow-up to discuss weight loss.  She was last seen by me in October which time we attempted to start Ozempic this was denied, but she was able to eventually start Wegovy.  She will administer her fourth injection today (1/8).  She has lost approximately 15 pounds since October.  Her additional concern today is recent stress and insomnia after she ended a 7-year relationship with her boyfriend.  She endorses difficulty sleeping interested in starting a medication to help her sleep at night.  Past Medical History:  Diagnosis Date   Anxiety    Depression    OSA on CPAP 08/10/2017   Psoriatic arthritis [facility])    Past Surgical History:  Procedure Laterality Date   COLONOSCOPY WITH PROPOFOL N/A 03/26/2021   Procedure: COLONOSCOPY WITH PROPOFOL;  Surgeon: Lanelle Bal, DO;  [facility];  Service: Endoscopy;  Laterality: N/A;  ASA II / AM procedure   FRACTURE SURGERY     I & D EXTREMITY  11/10/2011   Procedure: IRRIGATION AND DEBRIDEMENT EXTREMITY;  Surgeon: Loanne Drilling;  Location: [facility] OR;  Service: Orthopedics;  Laterality: Right;   LEFT HEART CATH AND CORONARY ANGIOGRAPHY N/A 06/18/2018   Procedure: LEFT HEART CATH AND CORONARY ANGIOGRAPHY;  Surgeon: Runell Gess, MD;  Location: [facility] INV[facility];  Service: Cardiovascular;  Laterality: N/A;   ORIF ANKLE FRACTURE  11/10/2011   Procedure: OPEN REDUCTION INTERNAL FIXATION (ORIF) ANKLE FRACTURE;  Surgeon: Loanne Drilling;  Location: [facility] OR;  Service: Orthopedics;  Laterality: Right;  with screw applicaton   Social History   Tobacco Use   Smoking status: Never   Smokeless tobacco: Never  Vaping Use   Vaping Use: Never used  Substance Use Topics   Alcohol  use: Yes    Alcohol/week: 2.0 standard drinks of alcohol    Types: 2 Glasses of wine per week    Comment: Very rare   Drug use: No   Family History  Problem Relation Age of Onset   Stroke Mother    Hearing loss Father    Cancer Daughter        osteosarcoma of leg   Stroke Maternal Grandmother    Dementia Maternal Grandmother    Lymphoma Maternal Grandfather    Diabetes Paternal Grandmother    Hypertension Paternal Grandmother    Dementia Paternal Grandmother    Cancer Paternal Grandfather        colon   No Known Allergies  Review of Systems  Psychiatric/Behavioral:  Negative for suicidal ideas. The patient has insomnia.   All other systems reviewed and are negative.    Objective:     BP 130/82   Pulse 88   Ht 5\' 8"  (1.727 m)   Wt 246 lb (111.6 kg)   SpO2 97%   BMI 37.40 kg/m  BP Readings from Last 3 Encounters:  12/12/22 130/82  09/21/22 126/81  09/14/22 (!) 141/86   Physical Exam Vitals reviewed.  Constitutional:      General: She is not in acute distress.    Appearance: Normal appearance. She is obese. She is not toxic-appearing.  HENT:     Head: Normocephalic and atraumatic.  Right Ear: External ear normal.     Left Ear: External ear normal.     Nose: Nose normal. No congestion or rhinorrhea.     Mouth/Throat:     Mouth: Mucous membranes are moist.     Pharynx: Oropharynx is clear. No oropharyngeal exudate or posterior oropharyngeal erythema.  Eyes:     General: No scleral icterus.    Extraocular Movements: Extraocular movements intact.     Conjunctiva/sclera: Conjunctivae normal.     Pupils: Pupils are equal, round, and reactive to light.  Cardiovascular:     Rate and Rhythm: Normal rate and regular rhythm.     Pulses: Normal pulses.     Heart sounds: Normal heart sounds. No murmur heard.    No friction rub. No gallop.  Pulmonary:     Effort: Pulmonary effort is normal.     Breath sounds: Normal breath sounds. No wheezing, rhonchi or rales.   Abdominal:     General: Abdomen is flat. Bowel sounds are normal. There is no distension.     Palpations: Abdomen is soft.     Tenderness: There is no abdominal tenderness.  Musculoskeletal:        General: No swelling. Normal range of motion.     Cervical back: Normal range of motion.     Right lower leg: No edema.     Left lower leg: No edema.  Lymphadenopathy:     Cervical: No cervical adenopathy.  Skin:    General: Skin is warm and dry.     Capillary Refill: Capillary refill takes less than 2 seconds.     Coloration: Skin is not jaundiced.  Neurological:     General: No focal deficit present.     Mental Status: She is alert and oriented to person, place, and time.  Psychiatric:        Mood and Affect: Mood normal.        Behavior: Behavior normal.    Last CBC Lab Results  Component Value Date   WBC 6.3 08/24/2022   HGB 13.8 08/24/2022   HCT 41.3 08/24/2022   MCV 86 08/24/2022   MCH 28.8 08/24/2022   RDW 13.0 08/24/2022   PLT 389 08/24/2022   Last metabolic panel Lab Results  Component Value Date   GLUCOSE CANCELED 08/24/2022   NA 139 08/24/2022   K CANCELED 08/24/2022   CL 99 08/24/2022   CO2 20 08/24/2022   BUN 11 08/24/2022   CREATININE 0.71 08/24/2022   EGFR 103 08/24/2022   CALCIUM 9.4 08/24/2022   PROT 7.3 08/24/2022   ALBUMIN 4.3 08/24/2022   LABGLOB 3.0 08/24/2022   AGRATIO 1.4 08/24/2022   BILITOT 0.3 08/24/2022   ALKPHOS 82 08/24/2022   AST 20 08/24/2022   ALT 19 08/24/2022   ANIONGAP 8 06/11/2018   Last lipids Lab Results  Component Value Date   CHOL 231 (H) 08/24/2022   HDL 32 (L) 08/24/2022   LDLCALC 135 (H) 08/24/2022   TRIG 354 (H) 08/24/2022   CHOLHDL 7.2 (H) 08/24/2022   Last hemoglobin A1c Lab Results  Component Value Date   HGBA1C 5.8 (H) 08/24/2022   Last thyroid functions Lab Results  Component Value Date   TSH 2.190 08/24/2022   T4TOTAL 6.6 03/26/2012   Last vitamin D Lab Results  Component Value Date   VD25OH  23.1 (L) 08/24/2022   Last vitamin B12 and Folate Lab Results  Component Value Date   VITAMINB12 836 08/24/2022   FOLATE 8.8 08/24/2022  The 10-year ASCVD risk score (Arnett DK, et al., 2019) is: 3.4%    Assessment & Plan:   Problem List Items Addressed This Visit       Obesity - Primary    She was able to start [facility] recently.  She will administer her fourth injection of 0.25 mg today.  She has lost approximately 15 pounds since October.  She continues to her diet and attempts to exercise regularly. -Increase Wegovy to 0.5 mg weekly x 4 weeks -She was congratulated on her progress thus far and counseled on continuing lifestyle modifications aimed at weight loss      Depression with anxiety    She is not currently prescribed any medication for anxiety or depression.  Her symptoms have been worse recently after ending a 7-year relationship.  She specifically has difficulty with insomnia. -Hydroxyzine 25 mg nightly as needed has been prescribed today      Insomnia    Today she endorses insomnia after recently ending a 7-year relationship with her boyfriend.  She states that she feels fine during the day, however at night she has trouble both falling and staying asleep.  She is interested in a temporary medication to help with insomnia. -Hydroxyzine 25 mg nightly as needed prescribed for insomnia       Return in about 3 months (around 03/13/2023).    Johnette Abraham, MD

## 2023-01-31 ENCOUNTER — Other Ambulatory Visit: Payer: Self-pay

## 2023-01-31 ENCOUNTER — Encounter: Payer: Self-pay | Admitting: Internal Medicine

## 2023-01-31 DIAGNOSIS — E782 Mixed hyperlipidemia: Secondary | ICD-10-CM

## 2023-01-31 DIAGNOSIS — E6609 Other obesity due to excess calories: Secondary | ICD-10-CM

## 2023-01-31 DIAGNOSIS — R7303 Prediabetes: Secondary | ICD-10-CM

## 2023-01-31 MED ORDER — SEMAGLUTIDE-WEIGHT MANAGEMENT 1 MG/0.5ML ~~LOC~~ SOAJ: 1.0000 mg | SUBCUTANEOUS | 0 refills | Status: DC

## 2023-01-31 MED ORDER — SEMAGLUTIDE-WEIGHT MANAGEMENT 1 MG/0.5ML ~~LOC~~ SOAJ: 1.0000 mg | SUBCUTANEOUS | 2 refills | Status: DC

## 2023-01-31 NOTE — Telephone Encounter (Signed)
New Rx for Wegovy 1 mg weekly sent to [facility].

## 2023-02-04 ENCOUNTER — Other Ambulatory Visit: Payer: Self-pay | Admitting: Internal Medicine

## 2023-02-04 DIAGNOSIS — J302 Other seasonal allergic rhinitis: Secondary | ICD-10-CM

## 2023-03-13 ENCOUNTER — Ambulatory Visit: Payer: 59 | Admitting: Internal Medicine

## 2023-03-14 ENCOUNTER — Ambulatory Visit: Payer: 59 | Admitting: Internal Medicine

## 2023-03-14 ENCOUNTER — Encounter: Payer: Self-pay | Admitting: Internal Medicine

## 2023-03-14 VITALS — BP 129/80 | HR 85 | Ht 68.0 in | Wt 230.8 lb

## 2023-03-14 DIAGNOSIS — E6609 Other obesity due to excess calories: Secondary | ICD-10-CM

## 2023-03-14 DIAGNOSIS — Z23 Encounter for immunization: Secondary | ICD-10-CM | POA: Diagnosis not present

## 2023-03-14 DIAGNOSIS — F418 Other specified anxiety disorders: Secondary | ICD-10-CM

## 2023-03-14 DIAGNOSIS — R110 Nausea: Secondary | ICD-10-CM

## 2023-03-14 DIAGNOSIS — R7303 Prediabetes: Secondary | ICD-10-CM

## 2023-03-14 DIAGNOSIS — Z6837 Body mass index (BMI) 37.0-37.9, adult: Secondary | ICD-10-CM

## 2023-03-14 MED ORDER — ONDANSETRON HCL 4 MG PO TABS: 4.0000 mg | ORAL_TABLET | Freq: Three times a day (TID) | ORAL | 0 refills | Status: DC | PRN

## 2023-03-14 MED ORDER — SEMAGLUTIDE-WEIGHT MANAGEMENT 1.7 MG/0.75ML ~~LOC~~ SOAJ: 1.7000 mg | SUBCUTANEOUS | 0 refills | Status: DC

## 2023-03-14 NOTE — Patient Instructions (Addendum)
It was a pleasure to see you today.  Thank you for giving [place] the opportunity to be involved in your care.  Below is a brief recap of your visit and next steps.  We will plan to see you again in 6 months.  Summary Increase Wegovy to 1.7 mg weekly Add Zofran for nausea relief Follow up in 6 months Shingles vaccine administered today

## 2023-03-14 NOTE — Progress Notes (Signed)
Established Patient Office Visit  Subjective   Patient ID: Crystal Houston, female    DOB: May 16, 1971  Age: 52 y.o. MRN: [id number]  Chief Complaint  Patient presents with   Weight Check    Follow up   Crystal Houston returns to care today for 7569-month follow-up.  She was last evaluated by me on 1/8 [facility]  She was also prescribed hydroxyzine for as needed anxiety relief.  In the interim she has been seen by rheumatology for follow-up.  There have otherwise been no acute interval events.  Crystal Houston reports feeling well today.  She is asymptomatic and has no acute concerns to discuss.  Past Medical History:  Diagnosis Date   Anxiety    Depression    OSA on CPAP 08/10/2017   Psoriatic arthritis    Past Surgical History:  Procedure Laterality Date   COLONOSCOPY WITH PROPOFOL N/A 03/26/2021   Procedure: COLONOSCOPY WITH PROPOFOL;  Surgeon: Lanelle Balarver, Charles K, DO;  [facility];  Service: Endoscopy;  Laterality: N/A;  ASA II / AM procedure   FRACTURE SURGERY     I & D EXTREMITY  11/10/2011   Procedure: IRRIGATION AND DEBRIDEMENT EXTREMITY;  Surgeon: Loanne DrillingFrank V Aluisio;  Location: [facility] OR;  Service: Orthopedics;  Laterality: Right;   LEFT HEART CATH AND CORONARY ANGIOGRAPHY N/A 06/18/2018   Procedure: LEFT HEART CATH AND CORONARY ANGIOGRAPHY;  Surgeon: Runell GessBerry, Jonathan J, MD;  Location: [facility] INVAS[facility] CV LAB;  Service: Cardiovascular;  Laterality: N/A;   ORIF ANKLE FRACTURE  11/10/2011   Procedure: OPEN REDUCTION INTERNAL FIXATION (ORIF) ANKLE FRACTURE;  Surgeon: Loanne DrillingFrank V Aluisio;  Location: [facility] OR;  Service: Orthopedics;  Laterality: Right;  with screw applicaton   Social History   Tobacco Use   Smoking status: Never   Smokeless tobacco: Never  Vaping Use   Vaping Use: Never used  Substance Use Topics   Alcohol use: Yes    Alcohol/week: 2.0 standard drinks of alcohol    Types: 2 Glasses of wine per week    Comment: Very rare   Drug use: No   Family  History  Problem Relation Age of Onset   Stroke Mother    Hearing loss Father    Cancer Daughter        osteosarcoma of leg   Stroke Maternal Grandmother    Dementia Maternal Grandmother    Lymphoma Maternal Grandfather    Diabetes Paternal Grandmother    Hypertension Paternal Grandmother    Dementia Paternal Grandmother    Cancer Paternal Grandfather        colon   No Known Allergies  Review of Systems  Constitutional:  Negative for chills and fever.  HENT:  Negative for sore throat.   Respiratory:  Negative for cough and shortness of breath.   Cardiovascular:  Negative for chest pain, palpitations and leg swelling.  Gastrointestinal:  Negative for abdominal pain, blood in stool, constipation, diarrhea, nausea and vomiting.  Genitourinary:  Negative for dysuria and hematuria.  Musculoskeletal:  Negative for myalgias.  Skin:  Negative for itching and rash.  Neurological:  Negative for dizziness and headaches.  Psychiatric/Behavioral:  Negative for depression and suicidal ideas.      Objective:     BP 129/80   Pulse 85   Ht 5\' 8"  (1.727 m)   Wt 230 lb 12.8 oz (104.7 kg)   SpO2 97%   BMI 35.09 kg/m  BP Readings from Last 3 Encounters:  03/14/23  129/80  12/12/22 130/82  09/21/22 126/81   Physical Exam Vitals reviewed.  Constitutional:      General: She is not in acute distress.    Appearance: Normal appearance. She is obese. She is not toxic-appearing.  HENT:     Head: Normocephalic and atraumatic.     Right Ear: External ear normal.     Left Ear: External ear normal.     Nose: Nose normal. No congestion or rhinorrhea.     Mouth/Throat:     Mouth: Mucous membranes are moist.     Pharynx: Oropharynx is clear. No oropharyngeal exudate or posterior oropharyngeal erythema.  Eyes:     General: No scleral icterus.    Extraocular Movements: Extraocular movements intact.     Conjunctiva/sclera: Conjunctivae normal.     Pupils: Pupils are equal, round, and reactive  to light.  Cardiovascular:     Rate and Rhythm: Normal rate and regular rhythm.     Pulses: Normal pulses.     Heart sounds: Normal heart sounds. No murmur heard.    No friction rub. No gallop.  Pulmonary:     Effort: Pulmonary effort is normal.     Breath sounds: Normal breath sounds. No wheezing, rhonchi or rales.  Abdominal:     General: Abdomen is flat. Bowel sounds are normal. There is no distension.     Palpations: Abdomen is soft.     Tenderness: There is no abdominal tenderness.  Musculoskeletal:        General: No swelling. Normal range of motion.     Cervical back: Normal range of motion.     Right lower leg: No edema.     Left lower leg: No edema.  Lymphadenopathy:     Cervical: No cervical adenopathy.  Skin:    General: Skin is warm and dry.     Capillary Refill: Capillary refill takes less than 2 seconds.     Coloration: Skin is not jaundiced.  Neurological:     General: No focal deficit present.     Mental Status: She is alert and oriented to person, place, and time.  Psychiatric:        Mood and Affect: Mood normal.        Behavior: Behavior normal.   Last CBC Lab Results  Component Value Date   WBC 6.3 08/24/2022   HGB 13.8 08/24/2022   HCT 41.3 08/24/2022   MCV 86 08/24/2022   MCH 28.8 08/24/2022   RDW 13.0 08/24/2022   PLT 389 08/24/2022   Last metabolic panel Lab Results  Component Value Date   GLUCOSE CANCELED 08/24/2022   NA 139 08/24/2022   K CANCELED 08/24/2022   CL 99 08/24/2022   CO2 20 08/24/2022   BUN 11 08/24/2022   CREATININE 0.71 08/24/2022   EGFR 103 08/24/2022   CALCIUM 9.4 08/24/2022   PROT 7.3 08/24/2022   ALBUMIN 4.3 08/24/2022   LABGLOB 3.0 08/24/2022   AGRATIO 1.4 08/24/2022   BILITOT 0.3 08/24/2022   ALKPHOS 82 08/24/2022   AST 20 08/24/2022   ALT 19 08/24/2022   ANIONGAP 8 06/11/2018   Last lipids Lab Results  Component Value Date   CHOL 231 (H) 08/24/2022   HDL 32 (L) 08/24/2022   LDLCALC 135 (H) 08/24/2022    TRIG 354 (H) 08/24/2022   CHOLHDL 7.2 (H) 08/24/2022   Last hemoglobin A1c Lab Results  Component Value Date   HGBA1C 5.8 (H) 08/24/2022   Last thyroid functions Lab Results  Component  Value Date   TSH 2.190 08/24/2022   T4TOTAL 6.6 03/26/2012   Last vitamin D Lab Results  Component Value Date   VD25OH 23.1 (L) 08/24/2022   Last vitamin B12 and Folate Lab Results  Component Value Date   VITAMINB12 836 08/24/2022   FOLATE 8.8 08/24/2022   The 10-year ASCVD risk score (Arnett DK, et al., 2019) is: 3.3%    Assessment & Plan:   Problem List Items Addressed This Visit       Obesity - Primary    Presenting today for weight loss follow-up.  She is currently prescribed Wegovy 1 mg weekly.  Overall she has lost 31 pounds since starting Wegovy. -Increase Wegovy to 1.7 mg weekly -We will tentatively plan for follow-up in 6 months      Depression with anxiety    Hydroxyzine 25 mg nightly as needed has been working well for as needed anxiety relief. -No medication changes today      Nausea    She endorses nausea without vomiting for 2-3 days after administering her weekly Wegovy injection.  She has requested an as needed medication for nausea relief. -Zofran prescribed for as needed nausea relief      Need for zoster vaccination    Zoster vaccine administered today       Return in about 6 months (around 09/13/2023).    Billie LadePhillip E Kailyn Dubie, MD

## 2023-03-20 DIAGNOSIS — Z23 Encounter for immunization: Secondary | ICD-10-CM | POA: Insufficient documentation

## 2023-03-20 DIAGNOSIS — R110 Nausea: Secondary | ICD-10-CM | POA: Insufficient documentation

## 2023-03-20 NOTE — Assessment & Plan Note (Signed)
Hydroxyzine 25 mg nightly as needed has been working well for as needed anxiety relief. -No medication changes today

## 2023-03-20 NOTE — Assessment & Plan Note (Signed)
Zoster vaccine administered today

## 2023-03-20 NOTE — Assessment & Plan Note (Signed)
She endorses nausea without vomiting for 2-3 days after administering her weekly Wegovy injection.  She has requested an as needed medication for nausea relief. -Zofran prescribed for as needed nausea relief

## 2023-03-20 NOTE — Assessment & Plan Note (Addendum)
Presenting today for weight loss follow-up.  She is currently prescribed Wegovy 1 mg weekly.  Overall she has lost 31 pounds since starting Wegovy. -Increase Wegovy to 1.7 mg weekly -We will tentatively plan for follow-up in 6 months

## 2023-04-24 ENCOUNTER — Encounter: Payer: Self-pay | Admitting: Internal Medicine

## 2023-04-24 DIAGNOSIS — G4733 Obstructive sleep apnea (adult) (pediatric): Secondary | ICD-10-CM

## 2023-04-24 DIAGNOSIS — E6609 Other obesity due to excess calories: Secondary | ICD-10-CM

## 2023-04-24 DIAGNOSIS — R7303 Prediabetes: Secondary | ICD-10-CM

## 2023-04-24 MED ORDER — SEMAGLUTIDE-WEIGHT MANAGEMENT 2.4 MG/0.75ML ~~LOC~~ SOAJ
2.4000 mg | SUBCUTANEOUS | 1 refills | Status: DC
Start: 2023-04-24 — End: 2023-08-04

## 2023-04-25 ENCOUNTER — Telehealth: Payer: Self-pay | Admitting: Internal Medicine

## 2023-04-25 NOTE — Telephone Encounter (Signed)
PA already completed. Will appeal denied PA.

## 2023-04-25 NOTE — Telephone Encounter (Signed)
Crystal Houston called from [facility] needs to complete Prior authorization on medicine, has alrady been submitted.   Semaglutide-Weight Management 2.4 MG/0.75ML SOAJ [[id number]]   Call back # [phone]

## 2023-06-13 ENCOUNTER — Ambulatory Visit (INDEPENDENT_AMBULATORY_CARE_PROVIDER_SITE_OTHER): Payer: 59

## 2023-06-13 DIAGNOSIS — Z23 Encounter for immunization: Secondary | ICD-10-CM | POA: Diagnosis not present

## 2023-06-16 ENCOUNTER — Encounter: Payer: Self-pay | Admitting: Internal Medicine

## 2023-06-16 ENCOUNTER — Other Ambulatory Visit: Payer: Self-pay | Admitting: Internal Medicine

## 2023-06-16 DIAGNOSIS — J45998 Other asthma: Secondary | ICD-10-CM

## 2023-06-16 DIAGNOSIS — F418 Other specified anxiety disorders: Secondary | ICD-10-CM

## 2023-06-16 DIAGNOSIS — R5383 Other fatigue: Secondary | ICD-10-CM

## 2023-06-17 LAB — CBC WITH DIFFERENTIAL/PLATELET
Basophils Absolute: 0.1 10*3/uL (ref 0.0–0.2)
Basos: 1 %
EOS (ABSOLUTE): 0.1 10*3/uL (ref 0.0–0.4)
Eos: 2 %
Hematocrit: 36.9 % (ref 34.0–46.6)
Hemoglobin: 12.4 g/dL (ref 11.1–15.9)
Immature Grans (Abs): 0 10*3/uL (ref 0.0–0.1)
Immature Granulocytes: 0 %
Lymphocytes Absolute: 1.2 10*3/uL (ref 0.7–3.1)
Lymphs: 24 %
MCH: 30 pg (ref 26.6–33.0)
MCHC: 33.6 g/dL (ref 31.5–35.7)
MCV: 89 fL (ref 79–97)
Monocytes Absolute: 0.7 10*3/uL (ref 0.1–0.9)
Monocytes: 15 %
Neutrophils Absolute: 2.8 10*3/uL (ref 1.4–7.0)
Neutrophils: 58 %
Platelets: 341 10*3/uL (ref 150–450)
RBC: 4.13 x10E6/uL (ref 3.77–5.28)
RDW: 13.5 % (ref 11.7–15.4)
WBC: 4.9 10*3/uL (ref 3.4–10.8)

## 2023-06-17 LAB — CMP14+EGFR
ALT: 15 IU/L (ref 0–32)
AST: 21 IU/L (ref 0–40)
Albumin: 4.4 g/dL (ref 3.8–4.9)
Alkaline Phosphatase: 69 IU/L (ref 44–121)
BUN/Creatinine Ratio: 14 (ref 9–23)
BUN: 12 mg/dL (ref 6–24)
Bilirubin Total: 0.4 mg/dL (ref 0.0–1.2)
CO2: 25 mmol/L (ref 20–29)
Calcium: 9.2 mg/dL (ref 8.7–10.2)
Chloride: 103 mmol/L (ref 96–106)
Creatinine, Ser: 0.88 mg/dL (ref 0.57–1.00)
Globulin, Total: 2.2 g/dL (ref 1.5–4.5)
Glucose: 90 mg/dL (ref 70–99)
Potassium: 3.7 mmol/L (ref 3.5–5.2)
Sodium: 141 mmol/L (ref 134–144)
Total Protein: 6.6 g/dL (ref 6.0–8.5)
eGFR: 79 mL/min/1.73

## 2023-06-19 ENCOUNTER — Encounter: Payer: Self-pay | Admitting: Internal Medicine

## 2023-06-19 ENCOUNTER — Ambulatory Visit: Payer: 59 | Admitting: Internal Medicine

## 2023-06-19 VITALS — BP 119/78 | HR 80 | Ht 68.0 in | Wt 212.4 lb

## 2023-06-19 DIAGNOSIS — M450 Ankylosing spondylitis of multiple sites in spine: Secondary | ICD-10-CM

## 2023-06-19 DIAGNOSIS — L4050 Arthropathic psoriasis, unspecified: Secondary | ICD-10-CM

## 2023-06-19 DIAGNOSIS — E6609 Other obesity due to excess calories: Secondary | ICD-10-CM | POA: Diagnosis not present

## 2023-06-19 DIAGNOSIS — H5789 Other specified disorders of eye and adnexa: Secondary | ICD-10-CM | POA: Diagnosis not present

## 2023-06-19 DIAGNOSIS — Z6837 Body mass index (BMI) 37.0-37.9, adult: Secondary | ICD-10-CM

## 2023-06-19 NOTE — Patient Instructions (Signed)
It was a pleasure to see you today.  Thank you for giving [place] the opportunity to be involved in your care.  Below is a brief recap of your visit and next steps.  We will plan to see you again in October.  Summary Repeat labs ordered today We will request records from your eye doctor Please notify your rheumatologist of this issue Follow up as scheduled for October tentatively

## 2023-06-19 NOTE — Assessment & Plan Note (Addendum)
Presenting today for an acute visit after a hemorrhage was noted on one of her eyes during a recent optometry appointment.  Recent labs were unremarkable.  BP is normotensive.  She does not have a history of hypertension.  No history of diabetes mellitus.  Vascular history as noted above.  Her exam today is unremarkable. -I have requested additional details regarding her recent ophthalmologic examination.  She states that she has follow-up scheduled for this Thursday (7/18).  Of note, her past medical history is significant for psoriatic arthritis and ankylosing spondylitis.  This is currently being treated with Rinvoq and methotrexate.  I have recommended that she notify her rheumatologist of recent ophthalmologic findings as well. -Repeat labs ordered today -She is currently scheduled for routine follow-up with me in October, but will return to care before this date pending record review and lab results.

## 2023-06-19 NOTE — Progress Notes (Signed)
Acute Office Visit  Subjective:     Patient ID: Crystal Houston, female    DOB: 10/13/1971, 52 y.o.   MRN: [id number]  Chief Complaint  Patient presents with   Eye Problem    At eye appt last week, seen a hemorrhage and was told to get seen by PCP.    Crystal Houston presents today for an acute visit after a hemorrhage was noted in her eye at a recent optometry appointment.  She was evaluated by her optometrist on 7/9 and was told to follow-up with her PCP because there was a hemorrhage in one of her eyes.  She cannot recall which eye and is unaware of any additional details.  She does not have a history of hypertension or diabetes mellitus.  Vascular history is remarkable for venous insufficiency and coronary vasospasm.  She is asymptomatic currently, specifically denying visual changes, pain in either eye, and headache.  Review of Systems  Constitutional:  Negative for chills and fever.  HENT:  Negative for sore throat.   Respiratory:  Negative for cough and shortness of breath.   Cardiovascular:  Negative for chest pain, palpitations and leg swelling.  Gastrointestinal:  Negative for abdominal pain, blood in stool, constipation, diarrhea, nausea and vomiting.  Genitourinary:  Negative for dysuria and hematuria.  Musculoskeletal:  Negative for myalgias.  Skin:  Negative for itching and rash.  Neurological:  Negative for dizziness and headaches.  Psychiatric/Behavioral:  Negative for depression and suicidal ideas.         Objective:    BP 119/78   Pulse 80   Ht 5\' 8"  (1.727 m)   Wt 212 lb 6.4 oz (96.3 kg)   SpO2 98%   BMI 32.30 kg/m  BP Readings from Last 3 Encounters:  06/19/23 119/78  03/14/23 129/80  12/12/22 130/82   Physical Exam Vitals reviewed.  Constitutional:      General: She is not in acute distress.    Appearance: Normal appearance. She is not toxic-appearing.  HENT:     Head: Normocephalic and atraumatic.     Right Ear: External ear normal.     Left  Ear: External ear normal.     Nose: Nose normal. No congestion or rhinorrhea.     Mouth/Throat:     Mouth: Mucous membranes are moist.     Pharynx: Oropharynx is clear. No oropharyngeal exudate or posterior oropharyngeal erythema.  Eyes:     General: No scleral icterus.    Extraocular Movements: Extraocular movements intact.     Conjunctiva/sclera: Conjunctivae normal.     Pupils: Pupils are equal, round, and reactive to light.  Cardiovascular:     Rate and Rhythm: Normal rate and regular rhythm.     Pulses: Normal pulses.     Heart sounds: Normal heart sounds. No murmur heard.    No friction rub. No gallop.  Pulmonary:     Effort: Pulmonary effort is normal.     Breath sounds: Normal breath sounds. No wheezing, rhonchi or rales.  Abdominal:     General: Abdomen is flat. Bowel sounds are normal. There is no distension.     Palpations: Abdomen is soft.     Tenderness: There is no abdominal tenderness.  Musculoskeletal:        General: No swelling. Normal range of motion.     Cervical back: Normal range of motion.     Right lower leg: No edema.     Left lower leg: No edema.  Lymphadenopathy:  Cervical: No cervical adenopathy.  Skin:    General: Skin is warm and dry.     Capillary Refill: Capillary refill takes less than 2 seconds.     Coloration: Skin is not jaundiced.  Neurological:     General: No focal deficit present.     Mental Status: She is alert and oriented to person, place, and time.  Psychiatric:        Mood and Affect: Mood normal.        Behavior: Behavior normal.       Assessment & Plan:   Problem List Items Addressed This Visit       Eye hemorrhage, unspecified laterality    Presenting today for an acute visit after a hemorrhage was noted on one of her eyes during a recent optometry appointment.  Recent labs were unremarkable.  BP is normotensive.  She does not have a history of hypertension.  No history of diabetes mellitus.  Vascular history as noted  above.  Her exam today is unremarkable. -I have requested additional details regarding her recent ophthalmologic examination.  She states that she has follow-up scheduled for this Thursday (7/18).  Of note, her past medical history is significant for psoriatic arthritis and ankylosing spondylitis.  This is currently being treated with Rinvoq and methotrexate.  I have recommended that she notify her rheumatologist of recent ophthalmologic findings as well. -Repeat labs ordered today -She is currently scheduled for routine follow-up with me in October, but will return to care before this date pending record review and lab results.      Return if symptoms worsen or fail to improve.  Billie Lade, MD

## 2023-06-24 LAB — VITAMIN D 25 HYDROXY (VIT D DEFICIENCY, FRACTURES): Vit D, 25-Hydroxy: 22.9 ng/mL — ABNORMAL LOW (ref 30.0–100.0)

## 2023-06-24 LAB — TSH+FREE T4
Free T4: 1.15 ng/dL (ref 0.82–1.77)
TSH: 1.42 u[IU]/mL (ref 0.450–4.500)

## 2023-06-24 LAB — B12 AND FOLATE PANEL
Folate: 2.6 ng/mL — ABNORMAL LOW (ref >3.0)
Vitamin B-12: >2000 pg/mL — ABNORMAL HIGH (ref 232–1245)

## 2023-06-24 LAB — HEMOGLOBIN A1C
Est. average glucose Bld gHb Est-mCnc: 105 mg/dL
Hgb A1c MFr Bld: 5.3 % (ref 4.8–5.6)

## 2023-06-24 LAB — LIPID PANEL
Chol/HDL Ratio: 4.6 ratio — ABNORMAL HIGH (ref 0.0–4.4)
Cholesterol, Total: 218 mg/dL — ABNORMAL HIGH (ref 100–199)
HDL: 47 mg/dL (ref >39)
LDL Chol Calc (NIH): 150 mg/dL — ABNORMAL HIGH (ref 0–99)
Triglycerides: 116 mg/dL (ref 0–149)
VLDL Cholesterol Cal: 21 mg/dL (ref 5–40)

## 2023-07-05 ENCOUNTER — Encounter: Payer: Self-pay | Admitting: Internal Medicine

## 2023-07-11 ENCOUNTER — Ambulatory Visit: Payer: 59 | Admitting: Internal Medicine

## 2023-07-21 ENCOUNTER — Other Ambulatory Visit: Payer: Self-pay | Admitting: Internal Medicine

## 2023-07-21 DIAGNOSIS — R110 Nausea: Secondary | ICD-10-CM

## 2023-07-24 MED ORDER — ONDANSETRON HCL 4 MG PO TABS
4.0000 mg | ORAL_TABLET | Freq: Three times a day (TID) | ORAL | 0 refills | Status: DC | PRN
Start: 2023-07-24 — End: 2023-09-14

## 2023-08-04 ENCOUNTER — Other Ambulatory Visit: Payer: Self-pay | Admitting: Internal Medicine

## 2023-08-04 DIAGNOSIS — G4733 Obstructive sleep apnea (adult) (pediatric): Secondary | ICD-10-CM

## 2023-08-04 DIAGNOSIS — R7303 Prediabetes: Secondary | ICD-10-CM

## 2023-08-04 DIAGNOSIS — E6609 Other obesity due to excess calories: Secondary | ICD-10-CM

## 2023-08-17 LAB — LAB REPORT - SCANNED
EGFR: 71
HM Hepatitis Screen: NEGATIVE

## 2023-09-05 ENCOUNTER — Other Ambulatory Visit: Payer: Self-pay | Admitting: *Deleted

## 2023-09-05 DIAGNOSIS — I872 Venous insufficiency (chronic) (peripheral): Secondary | ICD-10-CM

## 2023-09-14 ENCOUNTER — Ambulatory Visit (INDEPENDENT_AMBULATORY_CARE_PROVIDER_SITE_OTHER): Payer: 59 | Admitting: Internal Medicine

## 2023-09-14 ENCOUNTER — Encounter: Payer: Self-pay | Admitting: Internal Medicine

## 2023-09-14 VITALS — BP 121/87 | HR 73 | Ht 68.0 in | Wt 209.1 lb

## 2023-09-14 DIAGNOSIS — G4733 Obstructive sleep apnea (adult) (pediatric): Secondary | ICD-10-CM | POA: Diagnosis not present

## 2023-09-14 DIAGNOSIS — Z23 Encounter for immunization: Secondary | ICD-10-CM | POA: Diagnosis not present

## 2023-09-14 DIAGNOSIS — R7303 Prediabetes: Secondary | ICD-10-CM | POA: Diagnosis not present

## 2023-09-14 DIAGNOSIS — E782 Mixed hyperlipidemia: Secondary | ICD-10-CM

## 2023-09-14 DIAGNOSIS — E66812 Obesity, class 2: Secondary | ICD-10-CM

## 2023-09-14 DIAGNOSIS — R110 Nausea: Secondary | ICD-10-CM

## 2023-09-14 DIAGNOSIS — E6609 Other obesity due to excess calories: Secondary | ICD-10-CM

## 2023-09-14 DIAGNOSIS — F418 Other specified anxiety disorders: Secondary | ICD-10-CM

## 2023-09-14 DIAGNOSIS — Z6837 Body mass index (BMI) 37.0-37.9, adult: Secondary | ICD-10-CM

## 2023-09-14 MED ORDER — ROSUVASTATIN CALCIUM 5 MG PO TABS
5.0000 mg | ORAL_TABLET | Freq: Every day | ORAL | 3 refills | Status: AC
Start: 2023-09-14 — End: ?

## 2023-09-14 MED ORDER — WEGOVY 2.4 MG/0.75ML ~~LOC~~ SOAJ: 2.4000 mg | SUBCUTANEOUS | 2 refills | Status: DC

## 2023-09-14 MED ORDER — ONDANSETRON HCL 4 MG PO TABS
4.0000 mg | ORAL_TABLET | Freq: Three times a day (TID) | ORAL | 2 refills | Status: DC | PRN
Start: 2023-09-14 — End: 2024-03-13

## 2023-09-14 NOTE — Assessment & Plan Note (Signed)
Influenza vaccine administered today.

## 2023-09-14 NOTE — Assessment & Plan Note (Signed)
Her acute concern today is hyperlipidemia.  Lipid panel last updated by me in July.  Total cholesterol 218 and LDL 150.  This was repeated by her rheumatologist in September.  Total cholesterol 211 and LDL 145.  Her 10-year ASCVD risk remains low at 1.8%.  Crystal Houston underwent LHC in July 2019 revealing normal coronary anatomy.  No history of hypertension, diabetes mellitus, or tobacco use.  Her increasing LDL is likely due to Rinvoq.  Lipid panel from 2018 reviewed.  Her LDL has increased by 40-50 points. -Through shared decision making, rosuvastatin 5 mg daily has been started.  Mediterranean diet reviewed.  Repeat lipid panel at follow-up in 6 months.

## 2023-09-14 NOTE — Patient Instructions (Signed)
It was a pleasure to see you today.  Thank you for giving [place] the opportunity to be involved in your care.  Below is a brief recap of your visit and next steps.  We will plan to see you again in 6 months.  Summary Start rosuvastatin 5 mg daily Medications refilled Flu shot today We will plan for follow up in 6 months

## 2023-09-14 NOTE — Progress Notes (Signed)
Established Patient Office Visit  Subjective   Patient ID: Crystal Houston, female    DOB: 02-10-1971  Age: 52 y.o. MRN: [id number]  Chief Complaint  Patient presents with   Follow-up    Disposition: cholesterol.   Crystal Houston returns to care today for routine follow-up.  She was last evaluated by me on 7/15 for an acute visit in the setting of a hemorrhage in one of her eyes that was noted by her optometrist.  No medication changes were made and repeat labs were ordered.  She has been evaluated by rheumatology in the interim and has also been reassessed by optometry.  There have otherwise been no acute interval events.  Previously seen by me for routine follow-up on 4/9.  Crystal Houston reports feeling well today.  She endorses resolving sinus congestion but is otherwise asymptomatic.  Her acute concern is wanting to discuss hyperlipidemia.  This was brought to her attention by her rheumatologist, who noted that Rinvoq can worsen hyperlipidemia.  She recommended that Crystal Houston discussed this with me today.  Past Medical History:  Diagnosis Date   Anxiety    Depression    OSA on CPAP 08/10/2017   Psoriatic arthritis [facility])    Past Surgical History:  Procedure Laterality Date   COLONOSCOPY WITH PROPOFOL N/A 03/26/2021   Procedure: COLONOSCOPY WITH PROPOFOL;  Surgeon: Lanelle Bal, DO;  [facility];  Service: Endoscopy;  Laterality: N/A;  ASA II / AM procedure   FRACTURE SURGERY     I & D EXTREMITY  11/10/2011   Procedure: IRRIGATION AND DEBRIDEMENT EXTREMITY;  Surgeon: Loanne Drilling;  Location: [facility] OR;  Service: Orthopedics;  Laterality: Right;   LEFT HEART CATH AND CORONARY ANGIOGRAPHY N/A 06/18/2018   Procedure: LEFT HEART CATH AND CORONARY ANGIOGRAPHY;  Surgeon: Runell Gess, MD;  Location: [facility] INVASIVE CV LAB;  Service: Cardiovascular;  Laterality: N/A;   ORIF ANKLE FRACTURE  11/10/2011   Procedure: OPEN REDUCTION INTERNAL FIXATION (ORIF) ANKLE FRACTURE;  Surgeon:  Loanne Drilling;  Location: [facility] OR;  Service: Orthopedics;  Laterality: Right;  with screw applicaton   Social History   Tobacco Use   Smoking status: Never   Smokeless tobacco: Never  Vaping Use   Vaping status: Never Used  Substance Use Topics   Alcohol use: Yes    Alcohol/week: 2.0 standard drinks of alcohol    Types: 2 Glasses of wine per week    Comment: Very rare   Drug use: No   Family History  Problem Relation Age of Onset   Stroke Mother    Hearing loss Father    Cancer Daughter        osteosarcoma of leg   Stroke Maternal Grandmother    Dementia Maternal Grandmother    Lymphoma Maternal Grandfather    Diabetes Paternal Grandmother    Hypertension Paternal Grandmother    Dementia Paternal Grandmother    Cancer Paternal Grandfather        colon   No Known Allergies  Review of Systems  HENT:  Positive for congestion.   All other systems reviewed and are negative.    Objective:     BP 121/87 (BP Location: Right Arm, Patient Position: Sitting, Cuff Size: Normal)   Pulse 73   Ht 5\' 8"  (1.727 m)   Wt 209 lb 1.3 oz (94.8 kg)   SpO2 98%   BMI 31.79 kg/m  BP Readings from Last 3 Encounters:  09/14/23 121/87  06/19/23  119/78  03/14/23 129/80   Physical Exam Vitals reviewed.  Constitutional:      General: She is not in acute distress.    Appearance: Normal appearance. She is not toxic-appearing.  HENT:     Head: Normocephalic and atraumatic.     Right Ear: External ear normal.     Left Ear: External ear normal.     Nose: Nose normal. No congestion or rhinorrhea.     Mouth/Throat:     Mouth: Mucous membranes are moist.     Pharynx: Oropharynx is clear. No oropharyngeal exudate or posterior oropharyngeal erythema.  Eyes:     General: No scleral icterus.    Extraocular Movements: Extraocular movements intact.     Conjunctiva/sclera: Conjunctivae normal.     Pupils: Pupils are equal, round, and reactive to light.  Cardiovascular:     Rate and Rhythm:  Normal rate and regular rhythm.     Pulses: Normal pulses.     Heart sounds: Normal heart sounds. No murmur heard.    No friction rub. No gallop.  Pulmonary:     Effort: Pulmonary effort is normal.     Breath sounds: Normal breath sounds. No wheezing, rhonchi or rales.  Abdominal:     General: Abdomen is flat. Bowel sounds are normal. There is no distension.     Palpations: Abdomen is soft.     Tenderness: There is no abdominal tenderness.  Musculoskeletal:        General: No swelling. Normal range of motion.     Cervical back: Normal range of motion.     Right lower leg: No edema.     Left lower leg: No edema.  Lymphadenopathy:     Cervical: No cervical adenopathy.  Skin:    General: Skin is warm and dry.     Capillary Refill: Capillary refill takes less than 2 seconds.     Coloration: Skin is not jaundiced.  Neurological:     General: No focal deficit present.     Mental Status: She is alert and oriented to person, place, and time.  Psychiatric:        Mood and Affect: Mood normal.        Behavior: Behavior normal.   Last CBC Lab Results  Component Value Date   WBC 4.9 06/16/2023   HGB 12.4 06/16/2023   HCT 36.9 06/16/2023   MCV 89 06/16/2023   MCH 30.0 06/16/2023   RDW 13.5 06/16/2023   PLT 341 06/16/2023   Last metabolic panel Lab Results  Component Value Date   GLUCOSE 90 06/16/2023   NA 141 06/16/2023   K 3.7 06/16/2023   CL 103 06/16/2023   CO2 25 06/16/2023   BUN 12 06/16/2023   CREATININE 0.88 06/16/2023   EGFR 71.0 08/16/2023   CALCIUM 9.2 06/16/2023   PROT 6.6 06/16/2023   ALBUMIN 4.4 06/16/2023   LABGLOB 2.2 06/16/2023   AGRATIO 1.4 08/24/2022   BILITOT 0.4 06/16/2023   ALKPHOS 69 06/16/2023   AST 21 06/16/2023   ALT 15 06/16/2023   ANIONGAP 8 06/11/2018   Last lipids Lab Results  Component Value Date   CHOL 218 (H) 06/19/2023   HDL 47 06/19/2023   LDLCALC 150 (H) 06/19/2023   TRIG 116 06/19/2023   CHOLHDL 4.6 (H) 06/19/2023   Last  hemoglobin A1c Lab Results  Component Value Date   HGBA1C 5.3 06/19/2023   Last thyroid functions Lab Results  Component Value Date   TSH 1.420 06/19/2023  T4TOTAL 6.6 03/26/2012   Last vitamin D Lab Results  Component Value Date   VD25OH 22.9 (L) 06/19/2023   Last vitamin B12 and Folate Lab Results  Component Value Date   VITAMINB12 >2000 (H) 06/19/2023   FOLATE 2.6 (L) 06/19/2023   The 10-year ASCVD risk score (Arnett DK, et al., 2019) is: 1.8%    Assessment & Plan:   Problem List Items Addressed This Visit       Obesity    Her weight today is 209 pounds.  She is currently prescribed Wegovy 2.4 mg weekly.  She has lost 50 pounds since October 2023. -Ms. Bousquet was [employer] on her progress.  She will continue Wegovy 2.4 mg weekly.      Depression with anxiety    Mood remains stable and anxiety well-controlled with as needed use of hydroxyzine.      Hyperlipidemia - Primary    Her acute concern today is hyperlipidemia.  Lipid panel last updated by me in July.  Total cholesterol 218 and LDL 150.  This was repeated by her rheumatologist in September.  Total cholesterol 211 and LDL 145.  Her 10-year ASCVD risk remains low at 1.8%.  Ms. Sanderlin underwent LHC in July 2019 revealing normal coronary anatomy.  No history of hypertension, diabetes mellitus, or tobacco use.  Her increasing LDL is likely due to Rinvoq.  Lipid panel from 2018 reviewed.  Her LDL has increased by 40-50 points. -Through shared decision making, rosuvastatin 5 mg daily has been started.  Mediterranean diet reviewed.  Repeat lipid panel at follow-up in 6 months.      Need for influenza vaccination    Influenza vaccine administered today      Return in about 6 months (around 03/14/2024).   Billie Lade, MD

## 2023-09-14 NOTE — Assessment & Plan Note (Signed)
Mood remains stable and anxiety well-controlled with as needed use of hydroxyzine.

## 2023-09-14 NOTE — Assessment & Plan Note (Signed)
Her weight today is 209 pounds.  She is currently prescribed Wegovy 2.4 mg weekly.  She has lost 50 pounds since October 2023. -Ms. Sterbenz was [employer] on her progress.  She will continue Wegovy 2.4 mg weekly.

## 2023-09-19 ENCOUNTER — Ambulatory Visit: Payer: 59 | Admitting: Vascular Surgery

## 2023-09-19 ENCOUNTER — Ambulatory Visit (HOSPITAL_COMMUNITY)
Admission: RE | Admit: 2023-09-19 | Discharge: 2023-09-19 | Disposition: A | Discharge status: Home / Self Care | Payer: 59 | Source: Ambulatory Visit | Attending: Vascular Surgery | Admitting: Vascular Surgery

## 2023-09-19 VITALS — BP 126/72 | HR 84 | Ht 68.0 in | Wt 212.3 lb

## 2023-09-19 DIAGNOSIS — I872 Venous insufficiency (chronic) (peripheral): Secondary | ICD-10-CM | POA: Diagnosis not present

## 2023-09-19 NOTE — Progress Notes (Unsigned)
REASON FOR VISIT:   26-month follow-up visit.  MEDICAL ISSUES:   CHRONIC VENOUS DISEASE: This patient has CEAP C1-2 venous disease.  Some of her symptoms could be attributed to venous hypertension but her anatomy is not favorable for intervention. Counseled about symptoms that would prompt intervention and to return should these develop. Otherwise, she should continue compression, elevation, exercise, and maintaining a healthy weight.  HPI:   Crystal Houston is a pleasant 52 y.o. female who was seen by Dr. Karin Lieu on 06/03/2022.  The patient had been referred for evaluation of bilateral ankle swelling.  This was more significant on the right side.  She does work with her legs down most of the time.  She did describe some pain when walking and does have a diagnosis of psoriatic arthritis.  She had no previous history of DVT.  On exam she had telangiectasias bilaterally with some small varicose veins at the medial aspect of her right knee.  On my history today she continues to have symptoms in both legs.  I agree with Dr. Karin Lieu that some of this certainly could be related to her arthritis.  She does describe some aching pain in her legs which is worse at the end of the day so certainly there may be a component of venous hypertension causing some of her symptoms.  She has no previous history of DVT.  She has been wearing her thigh-high compression stockings which do help some.  She also has been elevating her legs.  She does remain active as she is helping take care of her grandbaby.  09/19/23: returns to clinic for follow up. The patient is bothered by her varicose veins and desires intervention. She reports minimal symptoms in her lower extremities. Her main venous related complaint is discomfort in the veins. She has no swelling, inflammatory skin changes, or ulceration. I reviewed the relative contraindication to intervention in her case and explained the rationale for this.   Past Medical  History:  Diagnosis Date   Anxiety    Depression    OSA on CPAP 08/10/2017   Psoriatic arthritis (HCC)     Family History  Problem Relation Age of Onset   Stroke Mother    Hearing loss Father    Cancer Daughter        osteosarcoma of leg   Stroke Maternal Grandmother    Dementia Maternal Grandmother    Lymphoma Maternal Grandfather    Diabetes Paternal Grandmother    Hypertension Paternal Grandmother    Dementia Paternal Grandmother    Cancer Paternal Grandfather        colon    SOCIAL HISTORY: Social History   Tobacco Use   Smoking status: Never   Smokeless tobacco: Never  Substance Use Topics   Alcohol use: Yes    Alcohol/week: 2.0 standard drinks of alcohol    Types: 2 Glasses of wine per week    Comment: Very rare    No Known Allergies  Current Outpatient Medications  Medication Sig Dispense Refill   albuterol (VENTOLIN HFA) 108 (90 Base) MCG/ACT inhaler Inhale 2 puffs into the lungs every 6 (six) hours as needed for wheezing or shortness of breath. 3 Inhaler 2   BIOTIN PO Take 1 tablet by mouth daily.     Cholecalciferol (VITAMIN D3) 5000 units CAPS Take 10,000 Units by mouth daily.     cyclobenzaprine (FLEXERIL) 10 MG tablet Take 10 mg by mouth daily as needed (Back pain).  folic acid (FOLVITE) 1 MG tablet Take 1 mg by mouth daily.     hydrOXYzine (VISTARIL) 25 MG capsule Take 1 capsule (25 mg total) by mouth every 8 (eight) hours as needed. 30 capsule 0   methotrexate 50 MG/2ML injection Inject 50 mg/m2 into the skin every Friday. Dose is 0.6 ml     metroNIDAZOLE (METROGEL) 1 % gel Apply 1 application topically daily as needed (Rosacea).     montelukast (SINGULAIR) 10 MG tablet TAKE 1 TABLET(10 MG) BY MOUTH AT BEDTIME 30 tablet 3   naproxen (NAPROSYN) 500 MG tablet Take 500 mg by mouth 2 (two) times daily as needed (Back pain).     Needles & Syringes (1CC TB SYRINGE) MISC once injection     nitroGLYCERIN (NITROSTAT) 0.4 MG SL tablet See admin instructions.      ondansetron (ZOFRAN) 4 MG tablet Take 1 tablet (4 mg total) by mouth every 8 (eight) hours as needed for nausea or vomiting. 20 tablet 2   pregabalin (LYRICA) 75 MG capsule Take by mouth.     RINVOQ 15 MG TB24 Take 1 tablet by mouth daily.     rosuvastatin (CRESTOR) 5 MG tablet Take 1 tablet (5 mg total) by mouth daily. 90 tablet 3   Semaglutide-Weight Management (WEGOVY) 2.4 MG/0.75ML SOAJ Inject 2.4 mg into the skin once a week. 6 mL 2   No current facility-administered medications for this visit.   PHYSICAL EXAM:   Vitals:   09/19/23 1252  BP: 126/72  Pulse: 84  TempSrc: Temporal  SpO2: 99%  Weight: 212 lb 4.8 oz (96.3 kg)  Height: 5\' 8"  (1.727 m)   No acute distress Regular rate and rhythm Unlabored breathing 2+ DP pulses Reticular veins about legs bilaterally No significant swelling No prominent varicosities.  DATA:   Left lower extremity venous reflux:  - No evidence of deep vein thrombosis seen in the left lower extremity,  from the common femoral through the popliteal veins.  - No evidence of superficial venous thrombosis in the left lower  extremity.  - Venous reflux is noted in the left sapheno-femoral junction.  - Venous reflux is noted in the left greater saphenous vein in the thigh.   Leonie Douglas [facility] [phone]

## 2023-09-22 ENCOUNTER — Ambulatory Visit: Payer: 59 | Admitting: Family Medicine

## 2023-09-22 ENCOUNTER — Encounter: Payer: Self-pay | Admitting: Family Medicine

## 2023-09-22 VITALS — BP 111/79 | HR 80 | Ht 68.0 in | Wt 214.1 lb

## 2023-09-22 DIAGNOSIS — H01002 Unspecified blepharitis right lower eyelid: Secondary | ICD-10-CM

## 2023-09-22 DIAGNOSIS — H01005 Unspecified blepharitis left lower eyelid: Secondary | ICD-10-CM

## 2023-09-22 DIAGNOSIS — H5711 Ocular pain, right eye: Secondary | ICD-10-CM | POA: Diagnosis not present

## 2023-09-22 DIAGNOSIS — H01009 Unspecified blepharitis unspecified eye, unspecified eyelid: Secondary | ICD-10-CM | POA: Insufficient documentation

## 2023-09-22 MED ORDER — PREDNISONE 20 MG PO TABS: 20.0000 mg | ORAL_TABLET | Freq: Two times a day (BID) | ORAL | 0 refills | Status: AC

## 2023-09-22 NOTE — Progress Notes (Signed)
Patient Office Visit   Subjective   Patient ID: Crystal Houston, female    DOB: 1971/03/29  Age: 52 y.o. MRN: [id number]  CC:  Chief Complaint  Patient presents with   Eye Pain    Painful spot on rt. Eye x 2 days.    HPI Crystal Houston 52 year old female, presents to the clinic for right eyelid swelling started yesterday. She  has a past medical history of Anxiety, Depression, OSA on CPAP (08/10/2017), and Psoriatic arthritis (HCC).  Right Eyelid Swelling  The patient presents with swelling of the right eyelid, which is a new issue that has been gradually worsening. The swelling is constant, and there is no known injury or trauma associated with it. The pain is mild, rated 2/10 in severity. The patient has no history of exposure to pink eye and wears contact lenses. Pertinent negatives include no blurred vision, eye discharge, double vision, redness, fever, itching, or sensitivity to light (photophobia). She has tried using eye drops, which provided only mild relief.      Outpatient Encounter Medications as of 09/22/2023  Medication Sig   albuterol (VENTOLIN HFA) 108 (90 Base) MCG/ACT inhaler Inhale 2 puffs into the lungs every 6 (six) hours as needed for wheezing or shortness of breath.   BIOTIN PO Take 1 tablet by mouth daily.   Cholecalciferol (VITAMIN D3) 5000 units CAPS Take 10,000 Units by mouth daily.   cyclobenzaprine (FLEXERIL) 10 MG tablet Take 10 mg by mouth daily as needed (Back pain).   folic acid (FOLVITE) 1 MG tablet Take 1 mg by mouth daily.   hydrOXYzine (VISTARIL) 25 MG capsule Take 1 capsule (25 mg total) by mouth every 8 (eight) hours as needed.   methotrexate 50 MG/2ML injection Inject 50 mg/m2 into the skin every Friday. Dose is 0.6 ml   metroNIDAZOLE (METROGEL) 1 % gel Apply 1 application topically daily as needed (Rosacea).   montelukast (SINGULAIR) 10 MG tablet TAKE 1 TABLET(10 MG) BY MOUTH AT BEDTIME   naproxen (NAPROSYN) 500 MG tablet Take 500 mg by  mouth 2 (two) times daily as needed (Back pain).   Needles & Syringes (1CC TB SYRINGE) MISC once injection   nitroGLYCERIN (NITROSTAT) 0.4 MG SL tablet See admin instructions.   ondansetron (ZOFRAN) 4 MG tablet Take 1 tablet (4 mg total) by mouth every 8 (eight) hours as needed for nausea or vomiting.   predniSONE (DELTASONE) 20 MG tablet Take 1 tablet (20 mg total) by mouth 2 (two) times daily with a meal for 5 days.   pregabalin (LYRICA) 75 MG capsule Take by mouth.   RINVOQ 15 MG TB24 Take 1 tablet by mouth daily.   rosuvastatin (CRESTOR) 5 MG tablet Take 1 tablet (5 mg total) by mouth daily.   Semaglutide-Weight Management (WEGOVY) 2.4 MG/0.75ML SOAJ Inject 2.4 mg into the skin once a week.   No facility-administered encounter medications on file as of 09/22/2023.    Past Surgical History:  Procedure Laterality Date   COLONOSCOPY WITH PROPOFOL N/A 03/26/2021   Procedure: COLONOSCOPY WITH PROPOFOL;  Surgeon: Lanelle Bal, DO;  [facility];  Service: Endoscopy;  Laterality: N/A;  ASA II / AM procedure   FRACTURE SURGERY     I & D EXTREMITY  11/10/2011   Procedure: IRRIGATION AND DEBRIDEMENT EXTREMITY;  Surgeon: Loanne Drilling;  Location: [facility] OR;  Service: Orthopedics;  Laterality: Right;   LEFT HEART CATH AND CORONARY ANGIOGRAPHY N/A 06/18/2018   Procedure: LEFT HEART  CATH AND CORONARY ANGIOGRAPHY;  Surgeon: Runell Gess, MD;  Location: [facility] INVASIVE CV LAB;  Service: Cardiovascular;  Laterality: N/A;   ORIF ANKLE FRACTURE  11/10/2011   Procedure: OPEN REDUCTION INTERNAL FIXATION (ORIF) ANKLE FRACTURE;  Surgeon: Loanne Drilling;  Location: [facility] OR;  Service: Orthopedics;  Laterality: Right;  with screw applicaton    Review of Systems  Constitutional:  Negative for chills and fever.  HENT:  Negative for sinus pain.   Eyes:  Negative for blurred vision, double vision, photophobia, discharge and redness.       Eyelid discomfort   Respiratory:  Negative for shortness of  breath.   Cardiovascular:  Negative for chest pain.  Genitourinary:  Negative for dysuria.  Neurological:  Negative for dizziness.      Objective    BP 111/79   Pulse 80   Ht 5\' 8"  (1.727 m)   Wt 214 lb 1.9 oz (97.1 kg)   LMP 09/21/2022 (Approximate)   SpO2 96%   BMI 32.56 kg/m   Physical Exam Vitals reviewed.  Constitutional:      General: She is not in acute distress.    Appearance: Normal appearance. She is not ill-appearing, toxic-appearing or diaphoretic.  HENT:     Head: Normocephalic.  Eyes:     General:        Right eye: No discharge.        Left eye: No discharge.     Conjunctiva/sclera: Conjunctivae normal.     Comments: Right eyelid is swollen, with mild to moderate edema noted. The swelling is localized right and left lower eyelid, with no visible erythema or warmth.  Cardiovascular:     Rate and Rhythm: Normal rate.     Pulses: Normal pulses.     Heart sounds: Normal heart sounds.  Pulmonary:     Effort: Pulmonary effort is normal. No respiratory distress.     Breath sounds: Normal breath sounds.  Abdominal:     Tenderness: There is no left CVA tenderness.  Musculoskeletal:     Cervical back: Normal range of motion.  Skin:    General: Skin is warm and dry.     Capillary Refill: Capillary refill takes less than 2 seconds.  Neurological:     Mental Status: She is alert.     Coordination: Coordination normal.     Gait: Gait normal.       Assessment & Plan:  Eye pain, right  Blepharitis of lower eyelids of both eyes, unspecified type Assessment & Plan: Prednisone 20 mg twice daily x 5 days Advise Eyelid hygiene: Regularly cleaning the eyelids. Warm compresses can help to loosen crusts and oils on the eyelids. After applying the compress, gently scrub the eyelid margin using a clean cloth or cotton swab with diluted baby shampoo or a specially formulated eyelid cleanser.   Other orders -     predniSONE; Take 1 tablet (20 mg total) by mouth 2 (two)  times daily with a meal for 5 days.  Dispense: 10 tablet; Refill: 0    Return if symptoms worsen or fail to improve.   Cruzita Lederer Newman Nip, FNP

## 2023-09-22 NOTE — Assessment & Plan Note (Signed)
Prednisone 20 mg twice daily x 5 days Advise Eyelid hygiene: Regularly cleaning the eyelids. Warm compresses can help to loosen crusts and oils on the eyelids. After applying the compress, gently scrub the eyelid margin using a clean cloth or cotton swab with diluted baby shampoo or a specially formulated eyelid cleanser.

## 2023-09-22 NOTE — Patient Instructions (Signed)

## 2024-03-13 ENCOUNTER — Other Ambulatory Visit: Payer: Self-pay | Admitting: Internal Medicine

## 2024-03-13 DIAGNOSIS — R110 Nausea: Secondary | ICD-10-CM

## 2024-03-14 ENCOUNTER — Encounter: Payer: Self-pay | Admitting: Internal Medicine

## 2024-03-14 ENCOUNTER — Ambulatory Visit: Payer: 59 | Admitting: Internal Medicine

## 2024-03-14 VITALS — BP 114/79 | HR 93 | Ht 68.0 in | Wt 221.6 lb

## 2024-03-14 DIAGNOSIS — R7303 Prediabetes: Secondary | ICD-10-CM

## 2024-03-14 DIAGNOSIS — F418 Other specified anxiety disorders: Secondary | ICD-10-CM | POA: Diagnosis not present

## 2024-03-14 DIAGNOSIS — E559 Vitamin D deficiency, unspecified: Secondary | ICD-10-CM | POA: Diagnosis not present

## 2024-03-14 DIAGNOSIS — Z6837 Body mass index (BMI) 37.0-37.9, adult: Secondary | ICD-10-CM

## 2024-03-14 DIAGNOSIS — E6609 Other obesity due to excess calories: Secondary | ICD-10-CM

## 2024-03-14 DIAGNOSIS — E66812 Obesity, class 2: Secondary | ICD-10-CM | POA: Diagnosis not present

## 2024-03-14 DIAGNOSIS — E782 Mixed hyperlipidemia: Secondary | ICD-10-CM

## 2024-03-14 MED ORDER — HYDROXYZINE HCL 10 MG PO TABS
10.0000 mg | ORAL_TABLET | Freq: Three times a day (TID) | ORAL | 0 refills | Status: DC | PRN
Start: 2024-03-14 — End: 2024-09-27

## 2024-03-14 MED ORDER — FLUOXETINE HCL 20 MG PO TABS: 20.0000 mg | ORAL_TABLET | Freq: Every day | ORAL | 3 refills | Status: DC

## 2024-03-14 NOTE — Assessment & Plan Note (Signed)
 Lipid panel last updated in July 2024.  Total cholesterol 218 and LDL 150.  Rosuvastatin 5 mg daily was started in October.  Repeat lipid panel ordered today.

## 2024-03-14 NOTE — Patient Instructions (Signed)
 It was a pleasure to see you today.  Thank you for giving [place] the opportunity to be involved in your care.  Below is a brief recap of your visit and next steps.  We will plan to see you again in 6 months.  Summary Add fluoxetine 20 mg daiyl for anxiety Hydroxyzine 10 mg daily as needed Repeat labs Follow up in 6 months

## 2024-03-14 NOTE — Assessment & Plan Note (Signed)
 Her acute concern today is poorly controlled anxiety.  She is concerned that she may have recently experienced panic attacks.  Anxiety has previously been adequately managed with as needed use of hydroxyzine.  More recently, she states that her stress and anxiety have worsened because of personal relationships.  She is concerned about using hydroxyzine during the day due to symptoms of drowsiness. -Treatment options reviewed.  Through shared decision making, fluoxetine 20 mg daily has been prescribed for management of anxiety.  Hydroxyzine refilled at 10 mg 3 times daily as needed for anxiety relief.  She will return to care if symptoms worsen or fail to improve.

## 2024-03-14 NOTE — Progress Notes (Signed)
 Established Patient Office Visit  Subjective   Patient ID: Crystal Houston, female    DOB: 09-05-1971  Age: 53 y.o. MRN: [id number]  Chief Complaint  Patient presents with   Care Management    Six month follow up   Anxiety    Patient states her anxiety has gotten worse    Crystal Houston returns to care today for routine follow-up.  Crystal Houston was last evaluated by me in October 2024.  Rosuvastatin 5 mg daily was started at that time in the setting of hyperlipidemia.  No additional medication changes were made and 55-month follow-up was arranged.  There have been no Houston interval events.  Crystal Houston concern today is poorly controlled anxiety.  Symptoms have recently worsened and Crystal Houston has experienced panic attacks.  Hydroxyzine has previously been effective for as needed anxiety relief, however Crystal Houston is concerned about taking it during the day because of drowsiness.  Crystal Houston does not have any additional concerns to discuss.  Past Medical History:  Diagnosis Date   Anxiety    Depression    OSA on CPAP 08/10/2017   Psoriatic arthritis [facility])    Past Surgical History:  Procedure Laterality Date   COLONOSCOPY WITH PROPOFOL N/A 03/26/2021   Procedure: COLONOSCOPY WITH PROPOFOL;  Surgeon: Lanelle Bal, DO;  Location: [facility];  Service: Endoscopy;  Laterality: N/A;  ASA II / AM procedure   FRACTURE SURGERY     I & D EXTREMITY  11/10/2011   Procedure: IRRIGATION AND DEBRIDEMENT EXTREMITY;  Surgeon: Loanne Drilling;  Location: [facility] OR;  Service: Orthopedics;  Laterality: Right;   LEFT HEART CATH AND CORONARY ANGIOGRAPHY N/A 06/18/2018   Procedure: LEFT HEART CATH AND CORONARY ANGIOGRAPHY;  Surgeon: Runell Gess, MD;  Location: [facility] INVASIVE CV LAB;  Service: Cardiovascular;  Laterality: N/A;   ORIF ANKLE FRACTURE  11/10/2011   Procedure: OPEN REDUCTION INTERNAL FIXATION (ORIF) ANKLE FRACTURE;  Surgeon: Loanne Drilling;  Location: [facility] OR;  Service: Orthopedics;  Laterality: Right;  with screw  applicaton   Social History   Tobacco Use   Smoking status: Never   Smokeless tobacco: Never  Vaping Use   Vaping status: Never Used  Substance Use Topics   Alcohol use: Yes    Alcohol/week: 2.0 standard drinks of alcohol    Types: 2 Glasses of wine per week    Comment: Very rare   Drug use: No   Family History  Problem Relation Age of Onset   Stroke Mother    Hearing loss Father    Cancer Daughter        osteosarcoma of leg   Stroke Maternal Grandmother    Dementia Maternal Grandmother    Lymphoma Maternal Grandfather    Diabetes Paternal Grandmother    Hypertension Paternal Grandmother    Dementia Paternal Grandmother    Cancer Paternal Grandfather        colon   No Known Allergies  Review of Systems  Constitutional:  Negative for chills and fever.  HENT:  Negative for sore throat.   Respiratory:  Negative for cough and shortness of breath.   Cardiovascular:  Negative for chest pain, palpitations and leg swelling.  Gastrointestinal:  Negative for abdominal pain, blood in stool, constipation, diarrhea, nausea and vomiting.  Genitourinary:  Negative for dysuria and hematuria.  Musculoskeletal:  Negative for myalgias.  Skin:  Negative for itching and rash.  Neurological:  Negative for dizziness and headaches.  Psychiatric/Behavioral:  Negative for depression and  suicidal ideas. The patient is nervous/anxious.      Objective:     BP 114/79   Pulse 93   Ht 5\' 8"  (1.727 m)   Wt 221 lb 9.6 oz (100.5 kg)   SpO2 98%   BMI 33.69 kg/m  BP Readings from Last 3 Encounters:  03/14/24 114/79  09/22/23 111/79  09/19/23 126/72   Physical Exam Vitals reviewed.  Constitutional:      General: Crystal Houston is not in Houston distress.    Appearance: Normal appearance. Crystal Houston is obese. Crystal Houston is not toxic-appearing.  HENT:     Head: Normocephalic and atraumatic.     Right Ear: External ear normal.     Left Ear: External ear normal.     Nose: Nose normal. No congestion or rhinorrhea.      Mouth/Throat:     Mouth: Mucous membranes are moist.     Pharynx: Oropharynx is clear. No oropharyngeal exudate or posterior oropharyngeal erythema.  Eyes:     General: No scleral icterus.    Extraocular Movements: Extraocular movements intact.     Conjunctiva/sclera: Conjunctivae normal.     Pupils: Pupils are equal, round, and reactive to light.  Cardiovascular:     Rate and Rhythm: Normal rate and regular rhythm.     Pulses: Normal pulses.     Heart sounds: Normal heart sounds. No murmur heard.    No friction rub. No gallop.  Pulmonary:     Effort: Pulmonary effort is normal.     Breath sounds: Normal breath sounds. No wheezing, rhonchi or rales.  Abdominal:     General: Abdomen is flat. Bowel sounds are normal. There is no distension.     Palpations: Abdomen is soft.     Tenderness: There is no abdominal tenderness.  Musculoskeletal:        General: No swelling. Normal range of motion.     Cervical back: Normal range of motion.     Right lower leg: No edema.     Left lower leg: No edema.  Lymphadenopathy:     Cervical: No cervical adenopathy.  Skin:    General: Skin is warm and dry.     Capillary Refill: Capillary refill takes less than 2 seconds.     Coloration: Skin is not jaundiced.  Neurological:     General: No focal deficit present.     Mental Status: Crystal Houston is alert and oriented to person, place, and time.  Psychiatric:        Mood and Affect: Mood normal.        Behavior: Behavior normal.   Last CBC Lab Results  Component Value Date   WBC 4.9 06/16/2023   HGB 12.4 06/16/2023   HCT 36.9 06/16/2023   MCV 89 06/16/2023   MCH 30.0 06/16/2023   RDW 13.5 06/16/2023   PLT 341 06/16/2023   Last metabolic panel Lab Results  Component Value Date   GLUCOSE 90 06/16/2023   NA 141 06/16/2023   K 3.7 06/16/2023   CL 103 06/16/2023   CO2 25 06/16/2023   BUN 12 06/16/2023   CREATININE 0.88 06/16/2023   EGFR 71.0 08/16/2023   CALCIUM 9.2 06/16/2023   PROT 6.6  06/16/2023   ALBUMIN 4.4 06/16/2023   LABGLOB 2.2 06/16/2023   AGRATIO 1.4 08/24/2022   BILITOT 0.4 06/16/2023   ALKPHOS 69 06/16/2023   AST 21 06/16/2023   ALT 15 06/16/2023   ANIONGAP 8 06/11/2018   Last lipids Lab Results  Component Value Date  CHOL 218 (H) 06/19/2023   HDL 47 06/19/2023   LDLCALC 150 (H) 06/19/2023   TRIG 116 06/19/2023   CHOLHDL 4.6 (H) 06/19/2023   Last hemoglobin A1c Lab Results  Component Value Date   HGBA1C 5.3 06/19/2023   Last thyroid functions Lab Results  Component Value Date   TSH 1.420 06/19/2023   T4TOTAL 6.6 03/26/2012   Last vitamin D Lab Results  Component Value Date   VD25OH 22.9 (L) 06/19/2023   Last vitamin B12 and Folate Lab Results  Component Value Date   VITAMINB12 >2000 (H) 06/19/2023   FOLATE 2.6 (L) 06/19/2023   The 10-year ASCVD risk score (Arnett DK, et al., 2019) is: 1.7%    Assessment & Plan:   Problem List Items Addressed This Visit       Obesity   Crystal Houston remains on Wegovy 2.5 mg weekly.  Current weight 221 pounds, BMI 33.7.  Crystal Houston has gained weight since her last appointment, which Crystal Houston attributes to poor dieting habits.  Dietary changes aimed at weight loss were reviewed today.  Continue Wegovy 2.4 mg weekly.      Depression with anxiety - Primary   Her Houston concern today is poorly controlled anxiety.  Crystal Houston is concerned that Crystal Houston may have recently experienced panic attacks.  Anxiety has previously been adequately managed with as needed use of hydroxyzine.  More recently, Crystal Houston states that her stress and anxiety have worsened because of personal relationships.  Crystal Houston is concerned about using hydroxyzine during the day due to symptoms of drowsiness. -Treatment options reviewed.  Through shared decision making, fluoxetine 20 mg daily has been prescribed for management of anxiety.  Hydroxyzine refilled at 10 mg 3 times daily as needed for anxiety relief.  Crystal Houston will return to care if symptoms worsen or fail to improve.       Hyperlipidemia   Lipid panel last updated in July 2024.  Total cholesterol 218 and LDL 150.  Rosuvastatin 5 mg daily was started in October.  Repeat lipid panel ordered today.      Return in about 6 months (around 09/13/2024).   Billie Lade, MD

## 2024-03-14 NOTE — Assessment & Plan Note (Signed)
 She remains on Wegovy 2.5 mg weekly.  Current weight 221 pounds, BMI 33.7.  She has gained weight since her last appointment, which she attributes to poor dieting habits.  Dietary changes aimed at weight loss were reviewed today.  Continue Wegovy 2.4 mg weekly.

## 2024-03-15 ENCOUNTER — Telehealth: Payer: Self-pay | Admitting: Internal Medicine

## 2024-03-15 ENCOUNTER — Encounter: Payer: Self-pay | Admitting: Internal Medicine

## 2024-03-15 DIAGNOSIS — F418 Other specified anxiety disorders: Secondary | ICD-10-CM

## 2024-03-15 LAB — LIPID PANEL
Chol/HDL Ratio: 2.9 ratio (ref 0.0–4.4)
Cholesterol, Total: 178 mg/dL (ref 100–199)
HDL: 61 mg/dL (ref >39)
LDL Chol Calc (NIH): 94 mg/dL (ref 0–99)
Triglycerides: 133 mg/dL (ref 0–149)
VLDL Cholesterol Cal: 23 mg/dL (ref 5–40)

## 2024-03-15 LAB — CBC WITH DIFFERENTIAL/PLATELET
Basophils Absolute: 0.1 10*3/uL (ref 0.0–0.2)
Basos: 1 %
EOS (ABSOLUTE): 0.1 10*3/uL (ref 0.0–0.4)
Eos: 2 %
Hematocrit: 41.7 % (ref 34.0–46.6)
Hemoglobin: 13.9 g/dL (ref 11.1–15.9)
Immature Grans (Abs): 0 10*3/uL (ref 0.0–0.1)
Immature Granulocytes: 0 %
Lymphocytes Absolute: 1.4 10*3/uL (ref 0.7–3.1)
Lymphs: 27 %
MCH: 30.2 pg (ref 26.6–33.0)
MCHC: 33.3 g/dL (ref 31.5–35.7)
MCV: 91 fL (ref 79–97)
Monocytes Absolute: 0.5 10*3/uL (ref 0.1–0.9)
Monocytes: 9 %
Neutrophils Absolute: 3.3 10*3/uL (ref 1.4–7.0)
Neutrophils: 61 %
Platelets: 489 10*3/uL — ABNORMAL HIGH (ref 150–450)
RBC: 4.61 x10E6/uL (ref 3.77–5.28)
RDW: 12.6 % (ref 11.7–15.4)
WBC: 5.4 10*3/uL (ref 3.4–10.8)

## 2024-03-15 LAB — HEMOGLOBIN A1C
Est. average glucose Bld gHb Est-mCnc: 105 mg/dL
Hgb A1c MFr Bld: 5.3 % (ref 4.8–5.6)

## 2024-03-15 LAB — B12 AND FOLATE PANEL
Folate: 17.7 ng/mL (ref >3.0)
Vitamin B-12: 466 pg/mL (ref 232–1245)

## 2024-03-15 LAB — CMP14+EGFR
ALT: 17 IU/L (ref 0–32)
AST: 20 IU/L (ref 0–40)
Albumin: 4.2 g/dL (ref 3.8–4.9)
Alkaline Phosphatase: 83 IU/L (ref 44–121)
BUN/Creatinine Ratio: 14 (ref 9–23)
BUN: 13 mg/dL (ref 6–24)
Bilirubin Total: 0.3 mg/dL (ref 0.0–1.2)
CO2: 26 mmol/L (ref 20–29)
Calcium: 9.3 mg/dL (ref 8.7–10.2)
Chloride: 103 mmol/L (ref 96–106)
Creatinine, Ser: 0.96 mg/dL (ref 0.57–1.00)
Globulin, Total: 2.8 g/dL (ref 1.5–4.5)
Glucose: 90 mg/dL (ref 70–99)
Potassium: 4.4 mmol/L (ref 3.5–5.2)
Sodium: 140 mmol/L (ref 134–144)
Total Protein: 7 g/dL (ref 6.0–8.5)
eGFR: 71 mL/min/{1.73_m2} (ref >59)

## 2024-03-15 LAB — TSH+FREE T4
Free T4: 1.2 ng/dL (ref 0.82–1.77)
TSH: 1.99 u[IU]/mL (ref 0.450–4.500)

## 2024-03-15 LAB — VITAMIN D 25 HYDROXY (VIT D DEFICIENCY, FRACTURES): Vit D, 25-Hydroxy: 20.9 ng/mL — ABNORMAL LOW (ref 30.0–100.0)

## 2024-03-15 MED ORDER — FLUOXETINE HCL 20 MG PO CAPS: 20.0000 mg | ORAL_CAPSULE | Freq: Every day | ORAL | 3 refills | Status: AC

## 2024-03-15 NOTE — Telephone Encounter (Signed)
 Copied from CRM [id number]. Topic: Clinical - Medication Question >> Mar 15, 2024  2:24 PM [place] J wrote: Reason for CRM: Patient's FLUoxetine (PROZAC) 20 MG tablet is on the expensive side. The pharmacy stated that if the medication was changed to capsules instead of tablets then insurance will cover a bigger portion of the cost.

## 2024-03-15 NOTE — Addendum Note (Signed)
 Addended by: Christel Mormon E on: 03/15/2024 02:51 PM   Modules accepted: Orders

## 2024-04-18 ENCOUNTER — Encounter: Payer: Self-pay | Admitting: Internal Medicine

## 2024-04-18 NOTE — Telephone Encounter (Signed)
 Called patient lvm to call office to schedule a sooner appt.

## 2024-08-19 ENCOUNTER — Telehealth: Payer: Self-pay | Admitting: Pharmacy Technician

## 2024-08-19 ENCOUNTER — Other Ambulatory Visit (HOSPITAL_COMMUNITY): Payer: Self-pay

## 2024-08-19 NOTE — Telephone Encounter (Signed)
 Pharmacy Patient Advocate Encounter  Received notification from [employer] that Prior Authorization for Wegovy  2.4MG /0.75ML auto-injectors has been APPROVED from 08/19/2024 to 08/19/2025. Ran test claim, Copay is $24.99. This test claim was processed through [facility]- copay amounts may vary at other pharmacies due to pharmacy/plan contracts, or as the patient moves through the different stages of their insurance plan.   PA #/Case ID/Reference #: [id number]

## 2024-08-19 NOTE — Telephone Encounter (Signed)
 Pharmacy Patient Advocate Encounter   Received notification from CoverMyMeds that prior authorization for Wegovy  2.4mg /0.47ml auto-injectors is required/requested.   Insurance verification completed.   The patient is insured through [facility] .   Per test claim: PA required; PA submitted to above mentioned insurance via Latent Key/confirmation #/EOC [id number] Status is pending

## 2024-09-13 ENCOUNTER — Ambulatory Visit

## 2024-09-16 ENCOUNTER — Other Ambulatory Visit: Payer: Self-pay | Admitting: Internal Medicine

## 2024-09-16 DIAGNOSIS — E782 Mixed hyperlipidemia: Secondary | ICD-10-CM

## 2024-09-27 ENCOUNTER — Other Ambulatory Visit: Payer: Self-pay | Admitting: Internal Medicine

## 2024-09-27 DIAGNOSIS — F418 Other specified anxiety disorders: Secondary | ICD-10-CM

## 2024-09-27 DIAGNOSIS — J302 Other seasonal allergic rhinitis: Secondary | ICD-10-CM

## 2024-11-14 ENCOUNTER — Other Ambulatory Visit: Payer: Self-pay | Admitting: Internal Medicine

## 2024-11-14 DIAGNOSIS — G4733 Obstructive sleep apnea (adult) (pediatric): Secondary | ICD-10-CM

## 2024-11-14 DIAGNOSIS — E66812 Obesity, class 2: Secondary | ICD-10-CM

## 2024-11-14 DIAGNOSIS — R7303 Prediabetes: Secondary | ICD-10-CM
